# Patient Record
Sex: Female | Born: 1970 | Race: Black or African American | Hispanic: No | Smoking: Never smoker
Health system: Southern US, Community
[De-identification: ages and names within clinical notes are randomized; demographics above are authoritative.]

## PROBLEM LIST (undated history)

## (undated) DIAGNOSIS — M545 Low back pain, unspecified: Secondary | ICD-10-CM

## (undated) DIAGNOSIS — I10 Essential (primary) hypertension: Secondary | ICD-10-CM

## (undated) DIAGNOSIS — M1990 Unspecified osteoarthritis, unspecified site: Secondary | ICD-10-CM

## (undated) DIAGNOSIS — G4730 Sleep apnea, unspecified: Secondary | ICD-10-CM

## (undated) DIAGNOSIS — D649 Anemia, unspecified: Secondary | ICD-10-CM

## (undated) DIAGNOSIS — M542 Cervicalgia: Secondary | ICD-10-CM

## (undated) DIAGNOSIS — E1165 Type 2 diabetes mellitus with hyperglycemia: Secondary | ICD-10-CM

## (undated) DIAGNOSIS — E119 Type 2 diabetes mellitus without complications: Secondary | ICD-10-CM

## (undated) DIAGNOSIS — G4733 Obstructive sleep apnea (adult) (pediatric): Secondary | ICD-10-CM

## (undated) DIAGNOSIS — T7840XA Allergy, unspecified, initial encounter: Secondary | ICD-10-CM

## (undated) DIAGNOSIS — E1159 Type 2 diabetes mellitus with other circulatory complications: Secondary | ICD-10-CM

## (undated) DIAGNOSIS — E669 Obesity, unspecified: Secondary | ICD-10-CM

## (undated) DIAGNOSIS — I152 Hypertension secondary to endocrine disorders: Secondary | ICD-10-CM

## (undated) DIAGNOSIS — IMO0002 Reserved for concepts with insufficient information to code with codable children: Secondary | ICD-10-CM

## (undated) DIAGNOSIS — G8929 Other chronic pain: Secondary | ICD-10-CM

## (undated) HISTORY — DX: Hypertension secondary to endocrine disorders: I15.2

## (undated) HISTORY — DX: Anemia, unspecified: D64.9

## (undated) HISTORY — DX: Type 2 diabetes mellitus with hyperglycemia: E11.65

## (undated) HISTORY — DX: Low back pain, unspecified: M54.50

## (undated) HISTORY — DX: Other chronic pain: G89.29

## (undated) HISTORY — DX: Reserved for concepts with insufficient information to code with codable children: IMO0002

## (undated) HISTORY — DX: Obstructive sleep apnea (adult) (pediatric): G47.33

## (undated) HISTORY — PX: COLONOSCOPY: SHX174

## (undated) HISTORY — DX: Obesity, unspecified: E66.9

## (undated) HISTORY — DX: Sleep apnea, unspecified: G47.30

## (undated) HISTORY — DX: Type 2 diabetes mellitus with other circulatory complications: E11.59

## (undated) HISTORY — DX: Unspecified osteoarthritis, unspecified site: M19.90

## (undated) HISTORY — DX: Essential (primary) hypertension: I10

## (undated) HISTORY — DX: Type 2 diabetes mellitus without complications: E11.9

## (undated) HISTORY — DX: Cervicalgia: M54.2

## (undated) HISTORY — DX: Low back pain: M54.5

## (undated) HISTORY — DX: Allergy, unspecified, initial encounter: T78.40XA

## (undated) HISTORY — PX: TUBAL LIGATION: SHX77

---

## 2018-12-09 ENCOUNTER — Encounter: Payer: Self-pay | Admitting: Family Medicine

## 2018-12-09 ENCOUNTER — Other Ambulatory Visit: Payer: Self-pay

## 2018-12-09 ENCOUNTER — Ambulatory Visit (INDEPENDENT_AMBULATORY_CARE_PROVIDER_SITE_OTHER): Payer: PRIVATE HEALTH INSURANCE | Admitting: Family Medicine

## 2018-12-09 VITALS — BP 160/100 | HR 76 | Ht 63.75 in | Wt 201.4 lb

## 2018-12-09 DIAGNOSIS — E1159 Type 2 diabetes mellitus with other circulatory complications: Secondary | ICD-10-CM

## 2018-12-09 DIAGNOSIS — Z7689 Persons encountering health services in other specified circumstances: Secondary | ICD-10-CM

## 2018-12-09 DIAGNOSIS — E1165 Type 2 diabetes mellitus with hyperglycemia: Secondary | ICD-10-CM | POA: Diagnosis not present

## 2018-12-09 DIAGNOSIS — M545 Low back pain, unspecified: Secondary | ICD-10-CM

## 2018-12-09 DIAGNOSIS — G8929 Other chronic pain: Secondary | ICD-10-CM

## 2018-12-09 DIAGNOSIS — D649 Anemia, unspecified: Secondary | ICD-10-CM

## 2018-12-09 DIAGNOSIS — Z8619 Personal history of other infectious and parasitic diseases: Secondary | ICD-10-CM | POA: Insufficient documentation

## 2018-12-09 DIAGNOSIS — E119 Type 2 diabetes mellitus without complications: Secondary | ICD-10-CM

## 2018-12-09 DIAGNOSIS — G4733 Obstructive sleep apnea (adult) (pediatric): Secondary | ICD-10-CM

## 2018-12-09 DIAGNOSIS — E669 Obesity, unspecified: Secondary | ICD-10-CM | POA: Insufficient documentation

## 2018-12-09 DIAGNOSIS — I152 Hypertension secondary to endocrine disorders: Secondary | ICD-10-CM | POA: Insufficient documentation

## 2018-12-09 DIAGNOSIS — M7070 Other bursitis of hip, unspecified hip: Secondary | ICD-10-CM | POA: Insufficient documentation

## 2018-12-09 DIAGNOSIS — Z6834 Body mass index (BMI) 34.0-34.9, adult: Secondary | ICD-10-CM

## 2018-12-09 DIAGNOSIS — M542 Cervicalgia: Secondary | ICD-10-CM

## 2018-12-09 DIAGNOSIS — I10 Essential (primary) hypertension: Secondary | ICD-10-CM

## 2018-12-09 DIAGNOSIS — IMO0002 Reserved for concepts with insufficient information to code with codable children: Secondary | ICD-10-CM | POA: Insufficient documentation

## 2018-12-09 HISTORY — DX: Anemia, unspecified: D64.9

## 2018-12-09 HISTORY — DX: Obesity, unspecified: E66.9

## 2018-12-09 LAB — POCT GLYCOSYLATED HEMOGLOBIN (HGB A1C): Hemoglobin A1C: 8.9 % — AB (ref 4.0–5.6)

## 2018-12-09 MED ORDER — DAPAGLIFLOZIN PROPANEDIOL 5 MG PO TABS: 5.0000 mg | ORAL_TABLET | Freq: Every day | ORAL | 0 refills | Status: DC

## 2018-12-09 MED ORDER — METFORMIN HCL 1000 MG PO TABS: 1000.0000 mg | ORAL_TABLET | Freq: Two times a day (BID) | ORAL | 1 refills | Status: DC

## 2018-12-09 MED ORDER — LOSARTAN POTASSIUM-HCTZ 50-12.5 MG PO TABS: 1.0000 | ORAL_TABLET | Freq: Every day | ORAL | 1 refills | Status: DC

## 2018-12-09 NOTE — Progress Notes (Signed)
Subjective:    Patient ID: Cindy Carson, female    DOB: 1971-05-21, 48 y.o.   MRN: [id number]  HPI Chief Complaint  Patient presents with  . new pt    new pt, get est. needs refill on meds. right fingers feeling numb for the last month or 2. DM check sugars 2-3 times a week, ranging 200   She is new to the practice and here to establish care and for concerns related to chronic health conditions.  Previous medical care: moved here 5 months ago from [facility], [address] was her Primary Care. Has been working as Lawyer in [employer].    [facility].    Last CPE: 6 month ago with labs   Other providers: [place] doctor in [place]-  [place]- in [place]   OSA- states this was diagnosed in 2019 in [place]. States she does not use the CPAP, does like the mask. She could tell a difference in energy and wakefulness when she did use it.   Diabetes- diagnosed last year. She is non compliant with medications.  Has been taking Metformin 500 mg twice daily and misses doses a couple of times per week.  Invokana - not taking this. States she ran out of this 2 months ago.  Does not check her BS at home. Has meter and supplies at home and states she knows how to check her BS.   Diet is poor. States she is not motivated to eat healthy.  Does not exercise.    HTN- BP has been high at home for the past several months. Taking metoprolol but ran out of it months ago. Taking Clonidine twice daily for the past year.   States she has never been on a statin.   Anemia- iron deficiency and taking iron   Does not have her periods very often now.  Tubal ligation.  Colonoscopy 2-3 years ago for GI bleed. Hemorrhoids per patient.   Chronic pain- low back and neck. States gabapentin did not help.  States she has been taking Cymbalta for the past year or more. States she really cannot tell any difference in pain.   Social history: Lives with her son and his wife, works as Nurse, adult. Former Lawyer in [employer] Married. Husband lives in [place] Denies smoking, drinking alcohol, drug use   LMP: 11/11/2018 Contraception: tubal ligation    Reviewed allergies, medications, past medical, surgical, family, and social history.     Review of Systems Pertinent positives and negatives in the history of present illness.     Objective:   Physical Exam BP (!) 160/100   Pulse 76   Ht 5' 3.75" (1.619 m)   Wt 201 lb 6.4 oz (91.4 kg)   LMP 11/11/2018   BMI 34.84 kg/m   Alert and oriented and in no acute distress. Pharyngeal area is normal. Neck is supple without adenopathy or thyromegaly. Cardiac exam shows a regular sinus rhythm without murmurs or gallops. Lungs are clear to auscultation. Extremities without edema.  Skin is warm and dry.       Assessment & Plan:  Uncontrolled type 2 diabetes mellitus with hyperglycemia (HCC) - Plan: HgB A1c, CBC with Differential/Platelet, Comprehensive metabolic panel, TSH, T4, free, Lipid panel, dapagliflozin propanediol (FARXIGA) 5 MG TABS tablet, metFORMIN (GLUCOPHAGE) 1000 MG tablet  OSA (obstructive sleep apnea)  Hypertension complicating diabetes (HCC) - Plan: CBC with Differential/Platelet, Comprehensive metabolic panel, losartan-hydrochlorothiazide (HYZAAR) 50-12.5 MG tablet  Chronic low back  pain, unspecified back pain laterality, unspecified whether sciatica present  Chronic neck pain  Encounter to establish care  Anemia, unspecified type - Plan: CBC with Differential/Platelet, Vitamin B12, Folate, Iron, TIBC and Ferritin Panel  Class 1 obesity with serious comorbidity and body mass index (BMI) of 34.0 to 34.9 in adult, unspecified obesity type  She is a pleasant 48 year old female who is new to the practice and here to establish care.  She has complex medical history and has been noncompliant with medications. Recently moved here from [place].  I have no medical records currently but I will request these. Hemoglobin  A1c is 8.9% and diabetes is uncontrolled.  Increase metformin to 1000 mg twice daily and start [place] once daily.  Encouraged her to check her blood sugars twice daily for the next couple of weeks and keep a log of her readings to report back to me. In-depth counseling on the diabetes spectrum and standard of care for patients with diabetes.  She is not currently on a statin but we will need to get her started on one at her follow-up visit. She reports being up-to-date on diabetic eye exam.  I will attempt to request this. Hypertension uncontrolled.  She has not been taking metoprolol for several months.  I will stop the metoprolol.  Plan to wean her off clonidine over the next couple of weeks.  She was started on losartan/HCTZ for now.  Encouraged her to keep a close eye on her blood pressure over the next couple of weeks and report back readings. Encourage low-sodium diet. OSA-she is not currently using her CPAP.  Discussed potential long-term health consequences related to uncontrolled sleep apnea.  Encouraged her to start using her CPAP again.  I will attempt to get her sleep study from [place]. Chronic back and neck pain.  She was seen a pain specialist in [place].  Currently taking Cymbalta.  She will continue on this.  Discussed once I have her records that I can refer her to pain management here if she would like.Marland Kitchen History of anemia and she is currently taking iron.  States she does not know why she has anemia.  Reports being up-to-date on colonoscopy.  Check CBC and iron studies as well as vitamin B12 and folate. Follow-up pending labs or in 2 to 4 weeks. At least 45 minutes face-to-face with patient and more than 50% was in counseling and coronation of care.

## 2018-12-09 NOTE — Patient Instructions (Signed)
Your hemoglobin A1c is 8.9% today and your diabetes is uncontrolled.  Increase your metformin to 1000 mg twice daily with meals. Start for [facility] once daily.  Check your blood sugars twice daily for the next 2 weeks.  Keep a record of these readings.  Please call or return in 2 weeks with your readings.  Goal fasting blood sugar is 80-120.  Goal reading for 2 hours after meal is 130-160 If you are seeing readings greater than 200 then this is too high.  I recommend that you cut back on sweets, sugary drinks, carbohydrates such as potatoes, rice, pasta, bread. Increase your physical activity.  Your blood pressure today is elevated as well. Goal blood pressure readings are less than 130/80.  Wean off the clonidine by taking 1/2 tablet twice daily for the next 3 to 5 days.  Then take 1/2 tablet in the evenings only for 3 to 5 days, then stop the medication.  Stop the metoprolol since you have not been on it for several months.  I am sending in a new medication for your blood pressure called losartan/HCTZ.  This will be once daily.  Check your blood pressures twice daily for the next 2 weeks and keep a record. Call or return with these readings as well.  Avoid foods high in sodium   DASH Eating Plan DASH stands for "Dietary Approaches to Stop Hypertension." The DASH eating plan is a healthy eating plan that has been shown to reduce high blood pressure (hypertension). It may also reduce your risk for type 2 diabetes, heart disease, and stroke. The DASH eating plan may also help with weight loss. What are tips for following this plan?  General guidelines  Avoid eating more than 2,300 mg (milligrams) of salt (sodium) a day. If you have hypertension, you may need to reduce your sodium intake to 1,500 mg a day.  Limit alcohol intake to no more than 1 drink a day for nonpregnant women and 2 drinks a day for men. One drink equals 12 oz of beer, 5 oz of wine, or 1 oz of hard liquor.  Work  with your health care provider to maintain a healthy body weight or to lose weight. Ask what an ideal weight is for you.  Get at least 30 minutes of exercise that causes your heart to beat faster (aerobic exercise) most days of the week. Activities may include walking, swimming, or biking.  Work with your health care provider or diet and nutrition specialist (dietitian) to adjust your eating plan to your individual calorie needs. Reading food labels   Check food labels for the amount of sodium per serving. Choose foods with less than 5 percent of the Daily Value of sodium. Generally, foods with less than 300 mg of sodium per serving fit into this eating plan.  To find whole grains, look for the word "whole" as the first word in the ingredient list. Shopping  Buy products labeled as "low-sodium" or "no salt added."  Buy fresh foods. Avoid canned foods and premade or frozen meals. Cooking  Avoid adding salt when cooking. Use salt-free seasonings or herbs instead of table salt or sea salt. Check with your health care provider or pharmacist before using salt substitutes.  Do not fry foods. Cook foods using healthy methods such as baking, boiling, grilling, and broiling instead.  Cook with heart-healthy oils, such as olive, canola, soybean, or sunflower oil. Meal planning  Eat a balanced diet that includes: ? 5 or more servings  of fruits and vegetables each day. At each meal, try to fill half of your plate with fruits and vegetables. ? Up to 6-8 servings of whole grains each day. ? Less than 6 oz of lean meat, poultry, or fish each day. A 3-oz serving of meat is about the same size as a deck of cards. One egg equals 1 oz. ? 2 servings of low-fat dairy each day. ? A serving of nuts, seeds, or beans 5 times each week. ? Heart-healthy fats. Healthy fats called Omega-3 fatty acids are found in foods such as flaxseeds and coldwater fish, like sardines, salmon, and mackerel.  Limit how much you  eat of the following: ? Canned or prepackaged foods. ? Food that is high in trans fat, such as fried foods. ? Food that is high in saturated fat, such as fatty meat. ? Sweets, desserts, sugary drinks, and other foods with added sugar. ? Full-fat dairy products.  Do not salt foods before eating.  Try to eat at least 2 vegetarian meals each week.  Eat more home-cooked food and less restaurant, buffet, and fast food.  When eating at a restaurant, ask that your food be prepared with less salt or no salt, if possible. What foods are recommended? The items listed may not be a complete list. Talk with your dietitian about what dietary choices are best for you. Grains Whole-grain or whole-wheat bread. Whole-grain or whole-wheat pasta. Brown rice. [place]ena Morrow. Bulgur. Whole-grain and low-sodium cereals. Pita bread. Low-fat, low-sodium crackers. Whole-wheat flour tortillas. Vegetables Fresh or frozen vegetables (raw, steamed, roasted, or grilled). Low-sodium or reduced-sodium tomato and vegetable juice. Low-sodium or reduced-sodium tomato sauce and tomato paste. Low-sodium or reduced-sodium canned vegetables. Fruits All fresh, dried, or frozen fruit. Canned fruit in natural juice (without added sugar). Meat and other protein foods Skinless chicken or [place]. Ground chicken or [place]. Pork with fat trimmed off. Fish and seafood. Egg whites. Dried beans, peas, or lentils. Unsalted nuts, nut butters, and seeds. Unsalted canned beans. Lean cuts of beef with fat trimmed off. Low-sodium, lean deli meat. Dairy Low-fat (1%) or fat-free (skim) milk. Fat-free, low-fat, or reduced-fat cheeses. Nonfat, low-sodium ricotta or cottage cheese. Low-fat or nonfat yogurt. Low-fat, low-sodium cheese. Fats and oils Soft margarine without trans fats. Vegetable oil. Low-fat, reduced-fat, or light mayonnaise and salad dressings (reduced-sodium). Canola, safflower, olive, soybean, and sunflower oils. Avocado. Seasoning  and other foods Herbs. Spices. Seasoning mixes without salt. Unsalted popcorn and pretzels. Fat-free sweets. What foods are not recommended? The items listed may not be a complete list. Talk with your dietitian about what dietary choices are best for you. Grains Baked goods made with fat, such as croissants, muffins, or some breads. Dry pasta or rice meal packs. Vegetables Creamed or fried vegetables. Vegetables in a cheese sauce. Regular canned vegetables (not low-sodium or reduced-sodium). Regular canned tomato sauce and paste (not low-sodium or reduced-sodium). Regular tomato and vegetable juice (not low-sodium or reduced-sodium). Angie Fava. Olives. Fruits Canned fruit in a light or heavy syrup. Fried fruit. Fruit in cream or butter sauce. Meat and other protein foods Fatty cuts of meat. Ribs. Fried meat. Berniece Salines. Sausage. Bologna and other processed lunch meats. Salami. Fatback. Hotdogs. Bratwurst. Salted nuts and seeds. Canned beans with added salt. Canned or smoked fish. Whole eggs or egg yolks. Chicken or [place] with skin. Dairy Whole or 2% milk, cream, and half-and-half. Whole or full-fat cream cheese. Whole-fat or sweetened yogurt. Full-fat cheese. Nondairy creamers. Whipped toppings. Processed cheese and cheese spreads.  Fats and oils Butter. Stick margarine. Lard. Shortening. Ghee. Bacon fat. Tropical oils, such as coconut, palm kernel, or palm oil. Seasoning and other foods Salted popcorn and pretzels. Onion salt, garlic salt, seasoned salt, table salt, and sea salt. Worcestershire sauce. Tartar sauce. Barbecue sauce. Teriyaki sauce. Soy sauce, including reduced-sodium. Steak sauce. Canned and packaged gravies. Fish sauce. Oyster sauce. Cocktail sauce. Horseradish that you find on the shelf. Ketchup. Mustard. Meat flavorings and tenderizers. Bouillon cubes. Hot sauce and Tabasco sauce. Premade or packaged marinades. Premade or packaged taco seasonings. Relishes. Regular salad dressings.  Where to find more information:  National Heart, Lung, and Blood Institute: PopSteam.is  American Heart Association: www.heart.org Summary  The DASH eating plan is a healthy eating plan that has been shown to reduce high blood pressure (hypertension). It may also reduce your risk for type 2 diabetes, heart disease, and stroke.  With the DASH eating plan, you should limit salt (sodium) intake to 2,300 mg a day. If you have hypertension, you may need to reduce your sodium intake to 1,500 mg a day.  When on the DASH eating plan, aim to eat more fresh fruits and vegetables, whole grains, lean proteins, low-fat dairy, and heart-healthy fats.  Work with your health care provider or diet and nutrition specialist (dietitian) to adjust your eating plan to your individual calorie needs. This information is not intended to replace advice given to you by your health care provider. Make sure you discuss any questions you have with your health care provider. Document Released: 08/28/2011 Document Revised: 09/01/2016 Document Reviewed: 09/01/2016 Elsevier Interactive Patient Education  2019 [employer].

## 2018-12-10 LAB — COMPREHENSIVE METABOLIC PANEL
ALT: 24 IU/L (ref 0–32)
AST: 21 IU/L (ref 0–40)
Albumin/Globulin Ratio: 1.5 (ref 1.2–2.2)
Albumin: 4.5 g/dL (ref 3.8–4.8)
Alkaline Phosphatase: 96 IU/L (ref 39–117)
BILIRUBIN TOTAL: 0.2 mg/dL (ref 0.0–1.2)
BUN/Creatinine Ratio: 10 (ref 9–23)
BUN: 8 mg/dL (ref 6–24)
CO2: 23 mmol/L (ref 20–29)
Calcium: 9.8 mg/dL (ref 8.7–10.2)
Chloride: 100 mmol/L (ref 96–106)
Creatinine, Ser: 0.78 mg/dL (ref 0.57–1.00)
GFR calc Af Amer: 104 mL/min/{1.73_m2} (ref >59)
GFR calc non Af Amer: 90 mL/min/{1.73_m2} (ref >59)
Globulin, Total: 3.1 g/dL (ref 1.5–4.5)
Glucose: 207 mg/dL — ABNORMAL HIGH (ref 65–99)
Potassium: 4.7 mmol/L (ref 3.5–5.2)
Sodium: 139 mmol/L (ref 134–144)
TOTAL PROTEIN: 7.6 g/dL (ref 6.0–8.5)

## 2018-12-10 LAB — IRON,TIBC AND FERRITIN PANEL
Ferritin: 72 ng/mL (ref 15–150)
Iron Saturation: 23 % (ref 15–55)
Iron: 79 ug/dL (ref 27–159)
Total Iron Binding Capacity: 346 ug/dL (ref 250–450)
UIBC: 267 ug/dL (ref 131–425)

## 2018-12-10 LAB — LIPID PANEL
Chol/HDL Ratio: 4.6 ratio — ABNORMAL HIGH (ref 0.0–4.4)
Cholesterol, Total: 197 mg/dL (ref 100–199)
HDL: 43 mg/dL (ref >39)
LDL Calculated: 103 mg/dL — ABNORMAL HIGH (ref 0–99)
TRIGLYCERIDES: 254 mg/dL — AB (ref 0–149)
VLDL Cholesterol Cal: 51 mg/dL — ABNORMAL HIGH (ref 5–40)

## 2018-12-10 LAB — CBC WITH DIFFERENTIAL/PLATELET
Basophils Absolute: 0.1 10*3/uL (ref 0.0–0.2)
Basos: 1 %
EOS (ABSOLUTE): 0.1 10*3/uL (ref 0.0–0.4)
Eos: 2 %
Hematocrit: 42.5 % (ref 34.0–46.6)
Hemoglobin: 13.7 g/dL (ref 11.1–15.9)
IMMATURE GRANS (ABS): 0 10*3/uL (ref 0.0–0.1)
Immature Granulocytes: 0 %
Lymphocytes Absolute: 1.4 10*3/uL (ref 0.7–3.1)
Lymphs: 27 %
MCH: 25.8 pg — AB (ref 26.6–33.0)
MCHC: 32.2 g/dL (ref 31.5–35.7)
MCV: 80 fL (ref 79–97)
Monocytes Absolute: 0.4 10*3/uL (ref 0.1–0.9)
Monocytes: 7 %
NEUTROS ABS: 3.3 10*3/uL (ref 1.4–7.0)
Neutrophils: 63 %
Platelets: 409 10*3/uL (ref 150–450)
RBC: 5.3 x10E6/uL — ABNORMAL HIGH (ref 3.77–5.28)
RDW: 14.9 % (ref 11.7–15.4)
WBC: 5.3 10*3/uL (ref 3.4–10.8)

## 2018-12-10 LAB — TSH: TSH: 0.606 u[IU]/mL (ref 0.450–4.500)

## 2018-12-10 LAB — VITAMIN B12: Vitamin B-12: 1230 pg/mL (ref 232–1245)

## 2018-12-10 LAB — FOLATE: Folate: >20 ng/mL (ref >3.0)

## 2018-12-10 LAB — T4, FREE: Free T4: 1.34 ng/dL (ref 0.82–1.77)

## 2018-12-16 ENCOUNTER — Telehealth: Payer: Self-pay | Admitting: Family Medicine

## 2018-12-16 MED ORDER — ONETOUCH VERIO IQ SYSTEM W/DEVICE KIT: PACK | 0 refills | Status: DC

## 2018-12-16 NOTE — Telephone Encounter (Signed)
Pt called and said she is supposed to be checking her blood sugars but she is unable to find her glucometer and wants to know if she can a prescription for one sent to the [place] [address]. She has lancets and strips just need glucometer.

## 2018-12-16 NOTE — Telephone Encounter (Signed)
Can you check on this for her?

## 2018-12-16 NOTE — Telephone Encounter (Signed)
Pt is going to call insurance company to find out what is covered as we do not know.

## 2018-12-16 NOTE — Telephone Encounter (Signed)
Pt called and states any meter would work for her. She has used onetouch meter before and she has test strips and lancets already but just needs meter. I have sent in the meter

## 2018-12-29 ENCOUNTER — Telehealth: Payer: Self-pay | Admitting: Family Medicine

## 2018-12-29 MED ORDER — DULOXETINE HCL 60 MG PO CPEP: 60.0000 mg | ORAL_CAPSULE | Freq: Every day | ORAL | 0 refills | Status: DC

## 2018-12-29 NOTE — Telephone Encounter (Signed)
Pt has appt for next week. Please address refills.

## 2018-12-29 NOTE — Telephone Encounter (Signed)
Please ask her to schedule a virtual visit tomorrow so we can discuss blood sugar and also blood pressure readings and current treatment. Thanks.  I have not received her medical records as far as I can tell. Please check for me.

## 2018-12-29 NOTE — Telephone Encounter (Signed)
Left message for pt to call.

## 2018-12-29 NOTE — Telephone Encounter (Signed)
Ok to refill Cymbalta per request.

## 2018-12-29 NOTE — Telephone Encounter (Signed)
Pt called for refills of Cymbalta. Please send to [facility]. Also pt is emailing me her current blood sugar readings. I will send those back when received. Pt can be reached at [phone].

## 2018-12-29 NOTE — Telephone Encounter (Signed)
Done/ pt was notiifed

## 2019-01-06 ENCOUNTER — Other Ambulatory Visit: Payer: Self-pay

## 2019-01-06 ENCOUNTER — Encounter: Payer: Self-pay | Admitting: Family Medicine

## 2019-01-06 ENCOUNTER — Ambulatory Visit (INDEPENDENT_AMBULATORY_CARE_PROVIDER_SITE_OTHER): Payer: PRIVATE HEALTH INSURANCE | Admitting: Family Medicine

## 2019-01-06 VITALS — BP 175/104 | HR 80 | Wt 200.0 lb

## 2019-01-06 DIAGNOSIS — I10 Essential (primary) hypertension: Secondary | ICD-10-CM

## 2019-01-06 DIAGNOSIS — I152 Hypertension secondary to endocrine disorders: Secondary | ICD-10-CM

## 2019-01-06 DIAGNOSIS — G4733 Obstructive sleep apnea (adult) (pediatric): Secondary | ICD-10-CM

## 2019-01-06 DIAGNOSIS — E1165 Type 2 diabetes mellitus with hyperglycemia: Secondary | ICD-10-CM

## 2019-01-06 DIAGNOSIS — E1159 Type 2 diabetes mellitus with other circulatory complications: Secondary | ICD-10-CM | POA: Diagnosis not present

## 2019-01-06 DIAGNOSIS — Z6834 Body mass index (BMI) 34.0-34.9, adult: Secondary | ICD-10-CM

## 2019-01-06 DIAGNOSIS — E669 Obesity, unspecified: Secondary | ICD-10-CM | POA: Diagnosis not present

## 2019-01-06 MED ORDER — AMLODIPINE BESYLATE 5 MG PO TABS: 5.0000 mg | ORAL_TABLET | Freq: Every day | ORAL | 2 refills | Status: DC

## 2019-01-06 NOTE — Progress Notes (Addendum)
Subjective:    Patient ID: Cindy Carson, female    DOB: 1971-04-25, 48 y.o.   MRN: [id number]  HPI Documentation for virtual telephone encounter.  Interactive audio and video telecommunications were attempted between this provider and patient, however due to patient stating she does not have access to video capability, we continued and completed visit with audio only.   The patient was located at work.  2 patient identifiers used.  The provider was located in the office. The patient did consent to this visit and is aware of possible charges through their insurance for this visit.  The other persons participating in this telemedicine service were none.  This is a 4 week follow up on uncontrolled diabetes and HTN.  She is fairly new to me and at her initial visit last month she reported not being compliant on medications for diabetes or hypertension and not using CPAP for OSA. Previous medical care in [place] and no medical records received at this point.   Uncontrolled diabetes.  Her hemoglobin A1c was 8.9% 4 weeks ago.  She has been checking her blood sugars at home and her readings have been high.  Fasting blood sugar ranges from 187-300s. We increased her on Metformin to 1,000 mg bid and added Farxiga.  States she has not taken the [place] in a few days. She thinks it is causing her to have nausea.  Denies polyuria, polydipsia.   HTN- uncontrolled. Started her on losartan- HCTZ and she thinks she is taking this but not sure.  Weaned off clonidine and is no longer taking this.   Denies fever, chills, dizziness, chest pain, palpitations, shortness of breath, abdominal pain, V/D, urinary symptoms, LE edema.    States she is still not using CPAP and does not plan on using it. States she is aware that this could help her BP.   Diet- reports making healthy changes.  Exercising- walking more   LMP: 01/06/19  Reviewed allergies, medications, past medical, surgical, family, and social  history.   Review of Systems Pertinent positives and negatives in the history of present illness.     Objective:   Physical Exam BP (!) 175/104   Pulse 80   Wt 200 lb (90.7 kg)   LMP 01/06/2019   BMI 34.60 kg/m   Alert and oriented and in no acute distress.  Speaking in complete sentences without difficulty.  Denies chest pain, shortness of breath.       Assessment & Plan:  Uncontrolled type 2 diabetes mellitus with hyperglycemia (HCC)  Hypertension complicating diabetes (HCC) - Plan: amLODipine (NORVASC) 5 MG tablet  OSA (obstructive sleep apnea)  Class 1 obesity with serious comorbidity and body mass index (BMI) of 34.0 to 34.9 in adult, unspecified obesity type  Discussed limitations of telephone visit for follow-up on diabetes and hypertension. She does appear to be checking her blood sugars and blood pressures but they are still significantly elevated.  She does not appear to be taking her medications on a regular basis. She has weaned off the clonidine as requested.  Continue on Hyzaar and I will send amlodipine to her pharmacy.  She is aware that she should be on all 3 medications to attempt to get her blood pressure under better control.  She appears asymptomatic. Advised her to eat a low-sodium diet and continue checking her blood pressure at home. Discussed how untreated sleep apnea affects the body and can cause heart failure and may also be affecting her blood pressure.  She  continues to decline using the CPAP. Encouraged her to take the metformin 1000 mg twice daily and start back on for [employer].  Discussed that if nausea persist to let me know and to not stop the medication abruptly without telling me.  Discussed the possibility of adding a long-acting insulin or potentially a once weekly GLP-1 and she declines injections. Discussed the need to be on a statin due to her diabetes.  She declines this.  We will need to address at future appointment. Counseling on healthy  diet and exercise to help control diabetes and hypertension. She did not fill out a medical records request at her previous visit.  We will email this to her so that I can attempt to get medical records from [place]. Request an office follow-up in 6 to 8 weeks as long as the current pandemic is under better control and guidelines allow and office visits at that time.  Spent at least 25 minutes with patient via telephone encounter and 3 minutes reviewing her chart.  This virtual service is not related to other E/M service within previous 7 days.

## 2019-01-26 ENCOUNTER — Telehealth: Payer: Self-pay | Admitting: Family Medicine

## 2019-01-26 MED ORDER — DULOXETINE HCL 60 MG PO CPEP: 60.0000 mg | ORAL_CAPSULE | Freq: Every day | ORAL | 5 refills | Status: DC

## 2019-01-26 NOTE — Telephone Encounter (Signed)
Ok to refill for 6 months 

## 2019-01-26 NOTE — Telephone Encounter (Signed)
Pt called and is requesting a refill on her cymbalta pt needs is to go to [id number] - [place], [place] - [address] pt has a diabetes appt on 05-26

## 2019-01-26 NOTE — Telephone Encounter (Signed)
Sent med to pharmacy  

## 2019-02-15 ENCOUNTER — Ambulatory Visit (INDEPENDENT_AMBULATORY_CARE_PROVIDER_SITE_OTHER): Payer: PRIVATE HEALTH INSURANCE | Admitting: Family Medicine

## 2019-02-15 ENCOUNTER — Other Ambulatory Visit: Payer: Self-pay

## 2019-02-15 ENCOUNTER — Encounter: Payer: Self-pay | Admitting: Family Medicine

## 2019-02-15 VITALS — BP 174/97 | HR 105 | Wt 199.0 lb

## 2019-02-15 DIAGNOSIS — E1165 Type 2 diabetes mellitus with hyperglycemia: Secondary | ICD-10-CM

## 2019-02-15 DIAGNOSIS — G4733 Obstructive sleep apnea (adult) (pediatric): Secondary | ICD-10-CM

## 2019-02-15 DIAGNOSIS — Z91199 Patient's noncompliance with other medical treatment and regimen due to unspecified reason: Secondary | ICD-10-CM | POA: Insufficient documentation

## 2019-02-15 DIAGNOSIS — Z9119 Patient's noncompliance with other medical treatment and regimen: Secondary | ICD-10-CM

## 2019-02-15 DIAGNOSIS — E1159 Type 2 diabetes mellitus with other circulatory complications: Secondary | ICD-10-CM | POA: Diagnosis not present

## 2019-02-15 DIAGNOSIS — I152 Hypertension secondary to endocrine disorders: Secondary | ICD-10-CM

## 2019-02-15 DIAGNOSIS — E785 Hyperlipidemia, unspecified: Secondary | ICD-10-CM

## 2019-02-15 DIAGNOSIS — E669 Obesity, unspecified: Secondary | ICD-10-CM

## 2019-02-15 DIAGNOSIS — I10 Essential (primary) hypertension: Secondary | ICD-10-CM

## 2019-02-15 MED ORDER — EXENATIDE ER 2 MG/0.85ML ~~LOC~~ AUIJ: 1.0000 "application " | AUTO-INJECTOR | SUBCUTANEOUS | 1 refills | Status: DC

## 2019-02-15 MED ORDER — ATORVASTATIN CALCIUM 10 MG PO TABS: 10.0000 mg | ORAL_TABLET | Freq: Every day | ORAL | 1 refills | Status: DC

## 2019-02-15 MED ORDER — LOSARTAN POTASSIUM-HCTZ 100-12.5 MG PO TABS: 1.0000 | ORAL_TABLET | Freq: Every day | ORAL | 3 refills | Status: DC

## 2019-02-15 NOTE — Progress Notes (Signed)
Subjective:   Documentation for virtual audio and video telecommunications through [employer] encounter:  The patient was located at home. 2 patient identifiers used.  The provider was located in the office. The patient did consent to this visit and is aware of possible charges through their insurance for this visit.  The other persons participating in this telemedicine service were none.    Patient ID: Cindy Carson, female    DOB: July 15, 1971, 48 y.o.   MRN: [id number]  Cindy Carson is a 48 y.o. female who presents for follow-up of uncontrolled Type 2 diabetes mellitus and uncontrolled HTN. She has a history of noncompliance with treatment.   Last Hgb A1c 8.9% on 12/09/2018 Reports taking Metformin 1,000 bid and Farxiga 5 mg  States she occasionally forgets to take her evening dose of metformin.   Is not currently on a statin.   HTN- taking losartan -HCTZ 50-12.5 mg Started on amlodipine last month. Reports good daily compliance.   OSA- has been using CPAP some nights. States she can tell this helps her BP and energy level.    Patient is checking home blood sugars.   Home blood sugar records: BGs range between 150 and 242 How often is blood sugars being checked: 3 days per week Current symptoms include: none. Patient denies increased appetite, nausea, visual disturbances, vomiting and weight loss.  Patient is checking their feet daily. Any Foot concerns (callous, ulcer, wound, thickened nails, toenail fungus, skin fungus, hammer toe): none Last dilated eye exam: had one within the last year  Current treatments: doing well on DM meds. Medication compliance: good  Current diet: in general, a "healthy" diet   Current exercise: walking Known diabetic complications: none  The following portions of the patient's history were reviewed and updated as appropriate: allergies, current medications, past medical history, past social history and problem list.  ROS as in subjective above.      Objective:    Physical Exam Alert and in no distress otherwise not examined. This was a virtual visit.   Blood pressure (!) 174/97, pulse (!) 105, weight 199 lb (90.3 kg).  Lab Review Diabetic Labs Latest Ref Rng & Units 12/09/2018  HbA1c 4.0 - 5.6 % 8.9(A)  Chol 100 - 199 mg/dL 829197  HDL >56>39 mg/dL 43  Calc LDL 0 - 99 mg/dL 213(Y103(H)  Triglycerides 0 - 149 mg/dL 865(H254(H)  Creatinine 8.460.57 - 1.00 mg/dL 9.620.78   BP/Weight 9/52/84135/26/2020 01/06/2019 12/09/2018  Systolic BP 174 175 160  Diastolic BP 97 104 100  Wt. (Lbs) 199 200 201.4  BMI 34.43 34.6 34.84   No flowsheet data found.  Cindy Carson  reports that she has never smoked. She has never used smokeless tobacco. She reports that she does not drink alcohol or use drugs.     Assessment & Plan:    Uncontrolled type 2 diabetes mellitus with hyperglycemia (HCC) - Plan: Amb ref to Medical Nutrition Therapy-MNT  Hypertension complicating diabetes (HCC) - Plan: losartan-hydrochlorothiazide (HYZAAR) 100-12.5 MG tablet, Amb ref to Medical Nutrition Therapy-MNT  OSA (obstructive sleep apnea)  Personal history of noncompliance with medical treatment, presenting hazards to health  Hyperlipidemia, unspecified hyperlipidemia type - Plan: atorvastatin (LIPITOR) 10 MG tablet  Obesity (BMI 30-39.9) - Plan: Amb ref to Medical Nutrition Therapy-MNT  1. Rx changes: increase losartan to 100 mg and continue on HCTZ 12.5 mg and amlodipine. add GLP-1 to regimen for diabetes 2. Education: Reviewed 'ABCs' of diabetes management (respective goals in parentheses):  A1C (<7), blood pressure (<130/80), and  cholesterol (LDL <100). 3. Compliance at present is estimated to be fair. Efforts to improve compliance (if necessary) will be directed at dietary modifications: cut back on carbohydrates and sodium, increased exercise and regular blood sugar monitoring: daily. 4. Encouraged her to take her medications as prescribed daily and avoid skipping doses. 5. Congratulated her on  starting back using CPAP machine.  Encouraged her to continue it.  This should help with blood pressure and overall health. 6. Started her on a statin for diabetes standard of care. 7. Follow up: 1 month in office if possible.  She will be due for a repeat hemoglobin A1c 8. On still attempting to get records from [place] including diabetic eye exam.  Time spent on call was 25 minutes and in review of previous records 5 minutes total.  This virtual service is not related to other E/M service within previous 7 days.

## 2019-02-15 NOTE — Patient Instructions (Addendum)
Your blood pressure is still too high.  I am increasing your losartan- HCTZ dose to 100 - 12.5 mg daily.  Continue on amlodipine 5 mg daily.   Using your CPAP machine will help as you can tell. Continue using this nightly.   Your blood sugars are still to high as well.  Continue taking Metformin 1,000 mg twice daily.  Continue on Farxiga 5 mg once daily   Add once weekly injection of Bcise or Trulicity (we need to see which one is better covered by your insurance). This is not insulin. It is a GLP-1 agonist for diabetes.   I am sending in a medication called atorvastatin for your cholesterol. This is a once daily tablet.   Follow up with me in 4 weeks and bring in your blood sugar and blood pressure readings.

## 2019-03-08 ENCOUNTER — Ambulatory Visit: Payer: PRIVATE HEALTH INSURANCE | Admitting: Medical

## 2019-03-08 ENCOUNTER — Other Ambulatory Visit: Payer: Self-pay

## 2019-03-09 ENCOUNTER — Encounter: Payer: Self-pay | Admitting: Family Medicine

## 2019-03-09 ENCOUNTER — Ambulatory Visit (INDEPENDENT_AMBULATORY_CARE_PROVIDER_SITE_OTHER): Payer: PRIVATE HEALTH INSURANCE | Admitting: Family Medicine

## 2019-03-09 VITALS — BP 120/80 | HR 89 | Temp 97.7°F | Wt 197.6 lb

## 2019-03-09 DIAGNOSIS — I10 Essential (primary) hypertension: Secondary | ICD-10-CM

## 2019-03-09 DIAGNOSIS — I152 Hypertension secondary to endocrine disorders: Secondary | ICD-10-CM

## 2019-03-09 DIAGNOSIS — Z113 Encounter for screening for infections with a predominantly sexual mode of transmission: Secondary | ICD-10-CM | POA: Diagnosis not present

## 2019-03-09 DIAGNOSIS — B3731 Acute candidiasis of vulva and vagina: Secondary | ICD-10-CM

## 2019-03-09 DIAGNOSIS — N898 Other specified noninflammatory disorders of vagina: Secondary | ICD-10-CM

## 2019-03-09 DIAGNOSIS — B373 Candidiasis of vulva and vagina: Secondary | ICD-10-CM

## 2019-03-09 DIAGNOSIS — E1159 Type 2 diabetes mellitus with other circulatory complications: Secondary | ICD-10-CM

## 2019-03-09 DIAGNOSIS — E1165 Type 2 diabetes mellitus with hyperglycemia: Secondary | ICD-10-CM

## 2019-03-09 LAB — POCT WET PREP (WET MOUNT)
Clue Cells Wet Prep Whiff POC: NEGATIVE
Trichomonas Wet Prep HPF POC: ABSENT

## 2019-03-09 MED ORDER — FARXIGA 5 MG PO TABS: 5.0000 mg | ORAL_TABLET | Freq: Every day | ORAL | 0 refills | Status: DC

## 2019-03-09 MED ORDER — FLUCONAZOLE 150 MG PO TABS: 150.0000 mg | ORAL_TABLET | Freq: Once | ORAL | 0 refills | Status: AC

## 2019-03-09 NOTE — Progress Notes (Signed)
   Subjective:    Patient ID: Cindy Carson, female    DOB: [birth year], 48 y.o.   MRN: [id number]  HPI Chief Complaint  Patient presents with  . yeast infection    yeast infection- couple days, itchy, white discharge, checking sugars every once in a while   She is here with complaints of a 2-day history of vaginal itching and a thick white discharge that has a bad odor.  States she started her cycle today and had sexual intercourse with her husband prior to onset of symptoms.  Would like to be tested for STDs. No dyspareunia.   Denies fever, chills, chest pain, palpitations, shortness of breath, abdominal pain, back pain,  N/V/D, urinary symptoms.   HTN- states she is taking her medications daily without any side effects.   Diabetes- fasting BS ranging between 150-200.  Taking Metformin twice daily and [facility] was not affordable.   States she is taking atorvastatin daily. No concerns.   Reviewed allergies, medications, past medical, surgical, family, and social history.  Review of Systems Pertinent positives and negatives in the history of present illness.     Objective:   Physical Exam Exam conducted with a chaperone present.  Constitutional:      Appearance: Normal appearance.  Abdominal:     General: Abdomen is flat. There is no distension.     Palpations: Abdomen is soft.     Tenderness: There is no abdominal tenderness.  Genitourinary:    Labia:        Right: No rash, tenderness or lesion.        Left: No rash, tenderness or lesion.      Vagina: Vaginal discharge present.     Comments: White discharge  Neurological:     Mental Status: She is alert.    BP 120/80   Pulse 89   Temp 97.7 F (36.5 C) (Oral)   Wt 197 lb 9.6 oz (89.6 kg)   LMP 03/09/2019   SpO2 97%   BMI 34.18 kg/m         Assessment & Plan:  Vaginal discharge - Plan: POCT Wet Prep [facility]), HIV Antibody (routine testing w rflx), RPR, GC/Chlamydia Probe Amp, Trichomonas vaginalis,  RNA. Wet prep shows yeast infection. Treat with diflucan.   Hypertension complicating diabetes ([place]) - Plan: continue medications. BP under control now.   Vaginal odor - Plan: treat yeast infection   Screen for STD (sexually transmitted disease) - Plan: HIV Antibody (routine testing w rflx), RPR, GC/Chlamydia Probe Amp, Trichomonas vaginalis, RNA. Treat as needed.

## 2019-03-09 NOTE — Patient Instructions (Addendum)
Take the diflucan for yeast infection.   We will wait on your other results and let you know when I have them.   You can use over the counter Replens if needed for dryness.   Follow up with me for your diabetes check and cholesterol test in July as scheduled. Bring in your blood pressure machine and your blood sugar readings please. Come in fasting (nothing to eat or drink except water for at least 6 hours).

## 2019-03-10 LAB — RPR: RPR Ser Ql: NONREACTIVE

## 2019-03-10 LAB — HIV ANTIBODY (ROUTINE TESTING W REFLEX): HIV Screen 4th Generation wRfx: NONREACTIVE

## 2019-03-13 LAB — GC/CHLAMYDIA PROBE AMP
Chlamydia trachomatis, NAA: NEGATIVE
Neisseria Gonorrhoeae by PCR: NEGATIVE

## 2019-03-13 LAB — TRICHOMONAS VAGINALIS, PROBE AMP: Trich vag by NAA: NEGATIVE

## 2019-03-16 ENCOUNTER — Ambulatory Visit: Payer: PRIVATE HEALTH INSURANCE | Admitting: Family Medicine

## 2019-03-30 ENCOUNTER — Other Ambulatory Visit: Payer: Self-pay

## 2019-03-30 ENCOUNTER — Encounter: Payer: Self-pay | Admitting: Family Medicine

## 2019-03-30 ENCOUNTER — Ambulatory Visit (INDEPENDENT_AMBULATORY_CARE_PROVIDER_SITE_OTHER): Payer: PRIVATE HEALTH INSURANCE | Admitting: Family Medicine

## 2019-03-30 VITALS — BP 128/80 | HR 84 | Temp 98.6°F | Wt 197.4 lb

## 2019-03-30 DIAGNOSIS — Z6834 Body mass index (BMI) 34.0-34.9, adult: Secondary | ICD-10-CM

## 2019-03-30 DIAGNOSIS — E1165 Type 2 diabetes mellitus with hyperglycemia: Secondary | ICD-10-CM | POA: Diagnosis not present

## 2019-03-30 DIAGNOSIS — G4733 Obstructive sleep apnea (adult) (pediatric): Secondary | ICD-10-CM | POA: Diagnosis not present

## 2019-03-30 DIAGNOSIS — E1159 Type 2 diabetes mellitus with other circulatory complications: Secondary | ICD-10-CM | POA: Diagnosis not present

## 2019-03-30 DIAGNOSIS — Z91199 Patient's noncompliance with other medical treatment and regimen due to unspecified reason: Secondary | ICD-10-CM

## 2019-03-30 DIAGNOSIS — E669 Obesity, unspecified: Secondary | ICD-10-CM | POA: Diagnosis not present

## 2019-03-30 DIAGNOSIS — I10 Essential (primary) hypertension: Secondary | ICD-10-CM

## 2019-03-30 DIAGNOSIS — I152 Hypertension secondary to endocrine disorders: Secondary | ICD-10-CM

## 2019-03-30 DIAGNOSIS — E785 Hyperlipidemia, unspecified: Secondary | ICD-10-CM

## 2019-03-30 DIAGNOSIS — Z9119 Patient's noncompliance with other medical treatment and regimen: Secondary | ICD-10-CM

## 2019-03-30 MED ORDER — FARXIGA 5 MG PO TABS: 5.0000 mg | ORAL_TABLET | Freq: Every day | ORAL | 0 refills | Status: DC

## 2019-03-30 NOTE — Progress Notes (Signed)
Subjective:   Documentation for virtual audio and video telecommunications through [employer] encounter: She came in to the office late for her appt so we opted to get vitals and labs and then do a virtual visit in the afternoon.   The patient was located at work. 2 patient identifiers used.  The provider was located in the office. The patient did consent to this visit and is aware of possible charges through their insurance for this visit.  The other persons participating in this telemedicine service were none.    Patient ID: Cindy Carson, female    DOB: 1971-01-09, 48 y.o.   MRN: [id number]  Cindy Carson is a 48 y.o. female who presents for follow-up of Type 2 diabetes mellitus and other chronic health conditions.   HTN-reports taking amlodipine and Hyzaar daily without any issues.   DM-Hgb A1c 8.9 previously  Has appt with nutritionist later this month.   Declines insulin. States she will not take this. Knows her diet is the reason for hyperglycemia. States she is asymptomatic.   Misses doses of metformin in the evening.   Taking statin daily.   States she is not using her CPAP. States she does not like it.    Patient is checking home blood sugars.   Home blood sugar records: BGs range between 180 and 290 How often is blood sugars being checked: 2 times a week Current symptoms include: none. Patient denies increased appetite, nausea, visual disturbances, vomiting and weight loss.  Patient is checking their feet daily. Any Foot concerns (callous, ulcer, wound, thickened nails, toenail fungus, skin fungus, hammer toe): none Last dilated eye exam: due  Current treatments: ran out of farixga, pharmacy said they didn't have a rx for it. been out for 2 weeks gave a month worth of samples today. Taking metformin  Medication compliance: fair  Current diet: in general, a "healthy" diet   Current exercise: yoga- 2-3 times a week Known diabetic complications: none  The following portions  of the patient's history were reviewed and updated as appropriate: allergies, current medications, past medical history, past social history and problem list.  ROS as in subjective above.     Objective:    Physical Exam Alert and in no distress otherwise not examined.  Blood pressure 128/80, pulse 84, temperature 98.6 F (37 C), temperature source Oral, weight 197 lb 6.4 oz (89.5 kg), last menstrual period 03/09/2019, SpO2 97 %.  Lab Review Diabetic Labs Latest Ref Rng & Units 12/09/2018  HbA1c 4.0 - 5.6 % 8.9(A)  Chol 100 - 199 mg/dL 409197  HDL >81>39 mg/dL 43  Calc LDL 0 - 99 mg/dL 191(Y103(H)  Triglycerides 0 - 149 mg/dL 782(N254(H)  Creatinine 5.620.57 - 1.00 mg/dL 1.300.78   BP/Weight 8/6/57847/04/2019 03/09/2019 02/15/2019 01/06/2019 12/09/2018  Systolic BP 128 120 174 175 160  Diastolic BP 80 80 97 104 100  Wt. (Lbs) 197.4 197.6 199 200 201.4  BMI 34.15 34.18 34.43 34.6 34.84   No flowsheet data found.  Cindy Carson  reports that she has never smoked. She has never used smokeless tobacco. She reports that she does not drink alcohol or use drugs.     Assessment & Plan:    Uncontrolled type 2 diabetes mellitus with hyperglycemia (HCC) - Plan: CBC with Differential/Platelet, Comprehensive metabolic panel, TSH, T4, free, Hemoglobin A1c, dapagliflozin propanediol (FARXIGA) 5 MG TABS tablet- see below   Hypertension complicating diabetes (HCC) - Plan: CBC with Differential/Platelet, Comprehensive metabolic panel  Class 1 obesity with serious comorbidity and  body mass index (BMI) of 34.0 to 34.9 in adult, unspecified obesity type - Plan: TSH, T4, free, Lipid panel  OSA (obstructive sleep apnea)   Hyperlipidemia, unspecified hyperlipidemia type - Plan: CBC with Differential/Platelet, Comprehensive metabolic panel, Lipid panel  Personal history of noncompliance with medical treatment, presenting hazards to health   1. Rx changes: will make adjustments once i have her Hgb A1c tomorrow. she came in for labs today.   refuses to be on insulin. Discussed that the only way to get her blood sugars down will be to take better care of herself by eating a healthier diet and increasing exercise and/or medication changes. Sent in a new prescription for Farxiga, she has been out but did not let me know. Continue Metformin and set alarm to take this in the evening since she forgets.  2. Has appt to see the nutritionist.  3. Education: Reviewed 'ABCs' of diabetes management (respective goals in parentheses):  A1C (<7), blood pressure (<130/80), and cholesterol (LDL <100). 4. Compliance at present is estimated to be poor. Efforts to improve compliance (if necessary) will be directed at dietary modifications: cut back on sweets and carbohydrates, she is aware of her poor diet habits , increased exercise, regular blood sugar monitoring: daily and encouraged her to take her medications regularly, set a reminder. 5. Needs to schedule diabetic eye exam  6. HTN- BP in goal range. Continue on medications. Eat a low sodium diet.  7. HL- continue statin.  8. OSA untreated- discussed long term potential health consequences. Encouraged her to use CPAP machine.  9. Follow up: 4 months   She was in the office for vitals and labs this morning. She was late for her appointment and stated she did not have time to wait until my next open appt time so she opted to do a virtual visit this afternoon.   Time spent on call was 22 minutes and in review of previous records 4 minutes total.  This virtual service is not related to other E/M service within previous 7 days.

## 2019-03-31 ENCOUNTER — Other Ambulatory Visit: Payer: Self-pay | Admitting: Internal Medicine

## 2019-03-31 DIAGNOSIS — E875 Hyperkalemia: Secondary | ICD-10-CM

## 2019-03-31 LAB — CBC WITH DIFFERENTIAL/PLATELET
Basophils Absolute: 0.1 10*3/uL (ref 0.0–0.2)
Basos: 1 %
EOS (ABSOLUTE): 0.1 10*3/uL (ref 0.0–0.4)
Eos: 2 %
Hematocrit: 37.6 % (ref 34.0–46.6)
Hemoglobin: 12.7 g/dL (ref 11.1–15.9)
Immature Grans (Abs): 0 10*3/uL (ref 0.0–0.1)
Immature Granulocytes: 0 %
Lymphocytes Absolute: 1.5 10*3/uL (ref 0.7–3.1)
Lymphs: 26 %
MCH: 26.2 pg — ABNORMAL LOW (ref 26.6–33.0)
MCHC: 33.8 g/dL (ref 31.5–35.7)
MCV: 78 fL — ABNORMAL LOW (ref 79–97)
Monocytes Absolute: 0.4 10*3/uL (ref 0.1–0.9)
Monocytes: 8 %
Neutrophils Absolute: 3.6 10*3/uL (ref 1.4–7.0)
Neutrophils: 63 %
Platelets: 362 10*3/uL (ref 150–450)
RBC: 4.85 x10E6/uL (ref 3.77–5.28)
RDW: 14.1 % (ref 11.7–15.4)
WBC: 5.7 10*3/uL (ref 3.4–10.8)

## 2019-03-31 LAB — COMPREHENSIVE METABOLIC PANEL
ALT: 15 IU/L (ref 0–32)
AST: 12 IU/L (ref 0–40)
Albumin/Globulin Ratio: 1.4 (ref 1.2–2.2)
Albumin: 4.2 g/dL (ref 3.8–4.8)
Alkaline Phosphatase: 88 IU/L (ref 39–117)
BUN/Creatinine Ratio: 7 — ABNORMAL LOW (ref 9–23)
BUN: 6 mg/dL (ref 6–24)
Bilirubin Total: 0.2 mg/dL (ref 0.0–1.2)
CO2: 19 mmol/L — ABNORMAL LOW (ref 20–29)
Calcium: 9.1 mg/dL (ref 8.7–10.2)
Chloride: 101 mmol/L (ref 96–106)
Creatinine, Ser: 0.86 mg/dL (ref 0.57–1.00)
GFR calc Af Amer: 92 mL/min/{1.73_m2} (ref >59)
GFR calc non Af Amer: 80 mL/min/{1.73_m2} (ref >59)
Globulin, Total: 3 g/dL (ref 1.5–4.5)
Glucose: 249 mg/dL — ABNORMAL HIGH (ref 65–99)
Potassium: 5.8 mmol/L — ABNORMAL HIGH (ref 3.5–5.2)
Sodium: 138 mmol/L (ref 134–144)
Total Protein: 7.2 g/dL (ref 6.0–8.5)

## 2019-03-31 LAB — LIPID PANEL
Chol/HDL Ratio: 3.4 ratio (ref 0.0–4.4)
Cholesterol, Total: 138 mg/dL (ref 100–199)
HDL: 41 mg/dL (ref >39)
LDL Calculated: 59 mg/dL (ref 0–99)
Triglycerides: 189 mg/dL — ABNORMAL HIGH (ref 0–149)
VLDL Cholesterol Cal: 38 mg/dL (ref 5–40)

## 2019-03-31 LAB — HEMOGLOBIN A1C
Est. average glucose Bld gHb Est-mCnc: 194 mg/dL
Hgb A1c MFr Bld: 8.4 % — ABNORMAL HIGH (ref 4.8–5.6)

## 2019-03-31 LAB — T4, FREE: Free T4: 1.23 ng/dL (ref 0.82–1.77)

## 2019-03-31 LAB — TSH: TSH: 1.05 u[IU]/mL (ref 0.450–4.500)

## 2019-04-04 ENCOUNTER — Other Ambulatory Visit: Payer: PRIVATE HEALTH INSURANCE

## 2019-04-14 ENCOUNTER — Ambulatory Visit: Payer: PRIVATE HEALTH INSURANCE | Admitting: Skilled Nursing Facility1

## 2019-05-18 ENCOUNTER — Encounter: Payer: Self-pay | Admitting: Skilled Nursing Facility1

## 2019-05-18 ENCOUNTER — Other Ambulatory Visit: Payer: Self-pay

## 2019-05-18 ENCOUNTER — Encounter: Payer: PRIVATE HEALTH INSURANCE | Attending: Family Medicine | Admitting: Skilled Nursing Facility1

## 2019-05-18 DIAGNOSIS — E1165 Type 2 diabetes mellitus with hyperglycemia: Secondary | ICD-10-CM | POA: Diagnosis present

## 2019-05-18 NOTE — Progress Notes (Signed)
Pt states she only takes 1 metformin and does not wear her C-PAP: dietitian educated the pt on the need to wear her C-PAP and take her medication properly.  Pts A1C 8.4. Pt states she checks her blood sugar 1 time a day fasting: 190-200 Pt states she checks her blood pressure in the morning as well. Pt states she stays tired in the afternoon admitting it is most likely due to not wearing her C-PAP. Pt states she does not want to eat meat anymore.  Pt states she is not into vegetables.  Pt states she is lactose intolerant.   Dietitian advised drinking a certain amount of water to edge out sugary drinks.   To talk about next time: Trying new vegetables Eating out less often  Diabetes Self-Management Education  Visit Type: First/Initial  05/18/2019  Cindy Carson, identified by name and date of birth, is a 48 y.o. female with a diagnosis of Diabetes: Type 2.   ASSESSMENT  Height _0  (1.575 m), weight 196 lb (88.9 kg). Body mass index is 35.85 kg/m.  Diabetes Self-Management Education - 05/18/19 0843      Visit Information   Visit Type  First/Initial      Initial Visit   Diabetes Type  Type 2    Are you taking your medications as prescribed?  No      Health Coping   How would you rate your overall health?  Poor      Psychosocial Assessment   Patient Belief/Attitude about Diabetes  Afraid    Self-management support  Family      Pre-Education Assessment   Patient understands the diabetes disease and treatment process.  Needs Instruction    Patient understands incorporating nutritional management into lifestyle.  Needs Instruction    Patient undertands incorporating physical activity into lifestyle.  Needs Instruction    Patient understands using medications safely.  Needs Instruction    Patient understands monitoring blood glucose, interpreting and using results  Needs Instruction    Patient understands prevention, detection, and treatment of acute complications.  Needs  Instruction    Patient understands prevention, detection, and treatment of chronic complications.  Needs Instruction    Patient understands how to develop strategies to address psychosocial issues.  Needs Instruction    Patient understands how to develop strategies to promote health/change behavior.  Needs Instruction      Complications   Last HgB A1C per patient/outside source  8.4 %    How often do you check your blood sugar?  1-2 times/day    Fasting Blood glucose range (mg/dL)  180-200    Number of hypoglycemic episodes per month  0    Number of hyperglycemic episodes per week  0    Have you had a dilated eye exam in the past 12 months?  No    Have you had a dental exam in the past 12 months?  No    Are you checking your feet?  Yes    How many days per week are you checking your feet?  7      Dietary Intake   Breakfast  2 oatmeal with suagr and butter or 2 grits with butter and sugar or fast food    Snack (morning)  fruit cup and candy    Lunch  2 ham and cheese sandwich and fruit cup or appelsauce or fast food    Snack (afternoon)  [place] ice and other dessert    Dinner  noodles or hot  dogs or mac n cheese or rice    Snack (evening)  [place] ice and popcicle    Beverage(s)  coffee with cream and sugar, bottled green tea, juice, soda, water      Exercise   Exercise Type  ADL's    How many days per week to you exercise?  0    How many minutes per day do you exercise?  0    Total minutes per week of exercise  0      Patient Education   Previous Diabetes Education  No    Disease state   Factors that contribute to the development of diabetes    Acute complications  Taught treatment of hypoglycemia - the 15 rule.;Discussed and identified patients' treatment of hyperglycemia.      Individualized Goals (developed by patient)   Nutrition  General guidelines for healthy choices and portions discussed;Follow meal plan discussed    Physical Activity  Exercise 1-2 times per week;30  minutes per day    Monitoring   test my blood glucose as discussed;test blood glucose pre and post meals as discussed      Post-Education Assessment   Patient understands the diabetes disease and treatment process.  Demonstrates understanding / competency    Patient understands incorporating nutritional management into lifestyle.  Demonstrates understanding / competency    Patient undertands incorporating physical activity into lifestyle.  Demonstrates understanding / competency    Patient understands using medications safely.  Demonstrates understanding / competency    Patient understands monitoring blood glucose, interpreting and using results  Demonstrates understanding / competency    Patient understands prevention, detection, and treatment of acute complications.  Demonstrates understanding / competency    Patient understands prevention, detection, and treatment of chronic complications.  Demonstrates understanding / competency    Patient understands how to develop strategies to address psychosocial issues.  Demonstrates understanding / competency    Patient understands how to develop strategies to promote health/change behavior.  Demonstrates understanding / competency      Outcomes   Expected Outcomes  Demonstrated interest in learning. Expect positive outcomes    Future DMSE  4-6 wks    Program Status  Completed       Individualized Plan for Diabetes Self-Management Training:   Learning Objective:  Patient will have a greater understanding of diabetes self-management. Patient education plan is to attend individual and/or group sessions per assessed needs and concerns.   Plan:   Patient Instructions  -Put metformin on the microwave    -Wear your C-PAP nightly! You gotta breath Girl!  -Get up at your desk every 30 minutes at work  -1 day a week go for a walk at lunch  -Always bring your meter with you everywhere you go -Always Properly dispose of your needles:  -Discard  in a hard plastic/metal container with a lid (something the needle can't puncture)  -Write Do Not Recycle on the outside of the container  -Example: A laundry detergent bottle -Never use the same needle more than once -Eat 2-3 carbohydrate choices for each meal and 1 for each snack -A meal: carbohydrates, protein, vegetable -A snack: A Fruit OR Vegetable AND Protein  -Try to be more active -Always pay attention to your body keeping watchful of possible low blood sugar (below 70) or high blood sugar (above 200)  -Check your feet every day looking for anything that was not there the day before   -Check your blood sugar 1 time every day (fasting OR  2 hours after a meal)  -Use alternative sugar instead of regular sugar in your oatmeal and grits  -Drink 64 ounces of water a day   Expected Outcomes:  Demonstrated interest in learning. Expect positive outcomes  Education material provided: ADA - How to Thrive: A Guide for Your Journey with Diabetes and Meal plan card  If problems or questions, patient to contact team via:  Phone  Future DSME appointment: 4-6 wks

## 2019-05-18 NOTE — Patient Instructions (Addendum)
-  Put metformin on the microwave    -Wear your C-PAP nightly! You gotta breath Girl!  -Get up at your desk every 30 minutes at work  -1 day a week go for a walk at lunch  -Always bring your meter with you everywhere you go -Always Properly dispose of your needles:  -Discard in a hard plastic/metal container with a lid (something the needle can't puncture)  -Write Do Not Recycle on the outside of the container  -Example: A laundry detergent bottle -Never use the same needle more than once -Eat 2-3 carbohydrate choices for each meal and 1 for each snack -A meal: carbohydrates, protein, vegetable -A snack: A Fruit OR Vegetable AND Protein  -Try to be more active -Always pay attention to your body keeping watchful of possible low blood sugar (below 70) or high blood sugar (above 200)  -Check your feet every day looking for anything that was not there the day before   -Check your blood sugar 1 time every day (fasting OR 2 hours after a meal)  -Use alternative sugar instead of regular sugar in your oatmeal and grits  -Drink 64 ounces of water a day

## 2019-06-02 ENCOUNTER — Other Ambulatory Visit: Payer: Self-pay | Admitting: Family Medicine

## 2019-06-02 DIAGNOSIS — E1159 Type 2 diabetes mellitus with other circulatory complications: Secondary | ICD-10-CM

## 2019-06-02 DIAGNOSIS — I152 Hypertension secondary to endocrine disorders: Secondary | ICD-10-CM

## 2019-06-22 ENCOUNTER — Ambulatory Visit: Payer: PRIVATE HEALTH INSURANCE | Admitting: *Deleted

## 2019-08-23 ENCOUNTER — Telehealth: Payer: Self-pay | Admitting: Family Medicine

## 2019-08-23 DIAGNOSIS — E1165 Type 2 diabetes mellitus with hyperglycemia: Secondary | ICD-10-CM

## 2019-08-23 DIAGNOSIS — E1159 Type 2 diabetes mellitus with other circulatory complications: Secondary | ICD-10-CM

## 2019-08-23 DIAGNOSIS — I152 Hypertension secondary to endocrine disorders: Secondary | ICD-10-CM

## 2019-08-23 DIAGNOSIS — E785 Hyperlipidemia, unspecified: Secondary | ICD-10-CM

## 2019-08-23 MED ORDER — METFORMIN HCL 1000 MG PO TABS: 1000.0000 mg | ORAL_TABLET | Freq: Two times a day (BID) | ORAL | 0 refills | Status: DC

## 2019-08-23 MED ORDER — DULOXETINE HCL 60 MG PO CPEP: 60.0000 mg | ORAL_CAPSULE | Freq: Every day | ORAL | 0 refills | Status: DC

## 2019-08-23 MED ORDER — AMLODIPINE BESYLATE 5 MG PO TABS: 5.0000 mg | ORAL_TABLET | Freq: Every day | ORAL | 0 refills | Status: DC

## 2019-08-23 MED ORDER — ATORVASTATIN CALCIUM 10 MG PO TABS: 10.0000 mg | ORAL_TABLET | Freq: Every day | ORAL | 0 refills | Status: DC

## 2019-08-23 NOTE — Telephone Encounter (Signed)
Pt just recently moved and needs refills on Amlodipine, Atorvastatin, Metformin and Duloxetine sent to the [facility] [address]. [place], [place] [address]

## 2019-08-23 NOTE — Telephone Encounter (Signed)
Sent in 1 and pt was notified

## 2019-08-23 NOTE — Telephone Encounter (Signed)
Ok to give her 30 days and she will need to find a new PCP in her area.

## 2019-10-04 ENCOUNTER — Telehealth: Payer: Self-pay | Admitting: Family Medicine

## 2019-10-04 NOTE — Telephone Encounter (Signed)
Dismissal letter in guarantor snapshot  °

## 2020-08-06 DIAGNOSIS — N951 Menopausal and female climacteric states: Secondary | ICD-10-CM | POA: Diagnosis not present

## 2020-08-06 DIAGNOSIS — E669 Obesity, unspecified: Secondary | ICD-10-CM | POA: Diagnosis not present

## 2020-08-06 DIAGNOSIS — Z01419 Encounter for gynecological examination (general) (routine) without abnormal findings: Secondary | ICD-10-CM | POA: Diagnosis not present

## 2020-08-06 DIAGNOSIS — E119 Type 2 diabetes mellitus without complications: Secondary | ICD-10-CM | POA: Diagnosis not present

## 2020-08-06 DIAGNOSIS — F320 Major depressive disorder, single episode, mild: Secondary | ICD-10-CM | POA: Diagnosis not present

## 2020-08-06 LAB — HM PAP SMEAR

## 2020-12-15 DIAGNOSIS — M5432 Sciatica, left side: Secondary | ICD-10-CM | POA: Diagnosis not present

## 2021-03-01 DIAGNOSIS — Z20822 Contact with and (suspected) exposure to covid-19: Secondary | ICD-10-CM | POA: Diagnosis not present

## 2021-04-03 ENCOUNTER — Ambulatory Visit: Payer: PRIVATE HEALTH INSURANCE | Admitting: Internal Medicine

## 2021-04-10 ENCOUNTER — Encounter: Payer: Self-pay | Admitting: Internal Medicine

## 2021-04-10 ENCOUNTER — Other Ambulatory Visit: Payer: Self-pay

## 2021-04-10 ENCOUNTER — Ambulatory Visit (INDEPENDENT_AMBULATORY_CARE_PROVIDER_SITE_OTHER): Payer: BC Managed Care – PPO | Admitting: Internal Medicine

## 2021-04-10 VITALS — BP 142/92 | HR 87 | Ht 62.0 in | Wt 193.4 lb

## 2021-04-10 DIAGNOSIS — I10 Essential (primary) hypertension: Secondary | ICD-10-CM | POA: Diagnosis not present

## 2021-04-10 DIAGNOSIS — E669 Obesity, unspecified: Secondary | ICD-10-CM

## 2021-04-10 DIAGNOSIS — G4733 Obstructive sleep apnea (adult) (pediatric): Secondary | ICD-10-CM | POA: Diagnosis not present

## 2021-04-10 DIAGNOSIS — E1165 Type 2 diabetes mellitus with hyperglycemia: Secondary | ICD-10-CM | POA: Diagnosis not present

## 2021-04-10 DIAGNOSIS — I152 Hypertension secondary to endocrine disorders: Secondary | ICD-10-CM

## 2021-04-10 DIAGNOSIS — E1159 Type 2 diabetes mellitus with other circulatory complications: Secondary | ICD-10-CM

## 2021-04-10 DIAGNOSIS — Z6834 Body mass index (BMI) 34.0-34.9, adult: Secondary | ICD-10-CM

## 2021-04-10 NOTE — Assessment & Plan Note (Signed)

## 2021-04-10 NOTE — Assessment & Plan Note (Deleted)
>>  ASSESSMENT AND PLAN FOR DIABETES TYPE 2, UNCONTROLLED WRITTEN ON 04/10/2021 10:25 AM BY MASOUD, JAVED, MD  - The patient's blood sugar is labile on med. - The patient will continue the current treatment regimen.  - I encouraged the patient to regularly check blood sugar.  - I encouraged the patient to monitor diet. I encouraged the patient to eat low-carb and low-sugar to help prevent blood sugar spikes.  - I encouraged the patient to continue following their prescribed treatment plan for diabetes - I informed the patient to get help if blood sugar drops below 54mg /dL, or if suddenly have trouble thinking clearly or breathing.  Patient was advised to buy a book on diabetes from a local bookstore or from .  Patient should read 2 chapters every day to keep the motivation going, this is in addition to some of the materials we provided them from the office.  There are other resources on the Internet like YouTube and wilkipedia to get an education on the diabetes

## 2021-04-10 NOTE — Assessment & Plan Note (Signed)

## 2021-04-10 NOTE — Progress Notes (Signed)
New Patient Office Visit  Subjective:  Patient ID: Cindy Carson, female    DOB: 15-Oct-1970  Age: 50 y.o. MRN: [id number]  CC:  Chief Complaint  Patient presents with   New Patient (Initial Visit)    Patient here to establish care, she is transferring from her current cardiologist.     HPI Patient presents for sleep apnoea, uses CPAP  machine , it is on auto set  Past Medical History:  Diagnosis Date   Anemia 12/09/2018   Chronic low back pain    Chronic neck pain    Diabetes type 2, uncontrolled ([place])    diagnosed in 2019   Hypertension complicating diabetes ([place])    Obesity 12/09/2018   OSA (obstructive sleep apnea)    sleep study done in [place] in 2019 per pt     Current Outpatient Medications:    amLODipine (NORVASC) 5 MG tablet, Take 1 tablet (5 mg total) by mouth daily., Disp: 30 tablet, Rfl: 0   atorvastatin (LIPITOR) 10 MG tablet, Take 1 tablet (10 mg total) by mouth daily. (Patient taking differently: Take 10 mg by mouth daily. Patient states they take 40 mg), Disp: 30 tablet, Rfl: 0   Blood Glucose Monitoring Suppl (ONETOUCH VERIO IQ SYSTEM) w/Device KIT, Test twice a day, one touch verio flex meter, Disp: 1 kit, Rfl: 0   DULoxetine (CYMBALTA) 60 MG capsule, Take 1 capsule (60 mg total) by mouth daily., Disp: 30 capsule, Rfl: 0   glipiZIDE (GLUCOTROL) 5 MG tablet, Take 5 mg by mouth at bedtime., Disp: , Rfl:    losartan (COZAAR) 25 MG tablet, Take 25 mg by mouth daily., Disp: , Rfl:    OZEMPIC, 0.25 OR 0.5 MG/DOSE, 2 MG/1.5ML SOPN, Inject 0.25 mg into the skin once a week., Disp: , Rfl:    dapagliflozin propanediol (FARXIGA) 5 MG TABS tablet, Take 5 mg by mouth daily., Disp: 30 tablet, Rfl: 0   Past Surgical History:  Procedure Laterality Date   COLONOSCOPY     TUBAL LIGATION      Family History  Problem Relation Age of Onset   Diabetes Sister     Social History   Socioeconomic History   Marital status: Married    Spouse name: Not on file   Number of  children: Not on file   Years of education: Not on file   Highest education level: Not on file  Occupational History   Not on file  Tobacco Use   Smoking status: Never   Smokeless tobacco: Never  Substance and Sexual Activity   Alcohol use: Never   Drug use: Never   Sexual activity: Yes    Birth control/protection: Surgical  Other Topics Concern   Not on file  Social History Narrative   Not on file   Social Determinants of Health   Financial Resource Strain: Not on file  Food Insecurity: Not on file  Transportation Needs: Not on file  Physical Activity: Not on file  Stress: Not on file  Social Connections: Not on file  Intimate Partner Violence: Not on file    ROS Review of Systems  Constitutional: Negative.  Negative for appetite change.  HENT: Negative.    Eyes: Negative.   Respiratory:  Positive for apnea. Negative for shortness of breath and wheezing.   Cardiovascular: Negative.   Gastrointestinal:  Positive for abdominal pain.  Endocrine: Negative.   Genitourinary: Negative.   Musculoskeletal: Negative.   Allergic/Immunologic: Negative.   Neurological: Negative.   Psychiatric/Behavioral: Negative.  Negative for dysphoric mood and self-injury.    Objective:   Today's Vitals: BP (!) 142/92   Pulse 87   Ht 5' 2"  (1.575 m)   Wt 193 lb 6.4 oz (87.7 kg)   BMI 35.37 kg/m   Physical Exam Constitutional:      Appearance: Normal appearance. She is obese.  HENT:     Head: Normocephalic.     Nose: Nose normal.     Mouth/Throat:     Mouth: Mucous membranes are moist.  Eyes:     Pupils: Pupils are equal, round, and reactive to light.  Cardiovascular:     Rate and Rhythm: Normal rate and regular rhythm.  Pulmonary:     Effort: Pulmonary effort is normal.     Breath sounds: Normal breath sounds.  Abdominal:     General: Abdomen is flat. Bowel sounds are normal.     Palpations: Abdomen is soft.  Musculoskeletal:        General: Normal range of motion.      Cervical back: Normal range of motion.  Skin:    General: Skin is warm.  Neurological:     General: No focal deficit present.     Mental Status: She is alert.  Psychiatric:        Mood and Affect: Mood normal.    Assessment & Plan:   Problem List Items Addressed This Visit       Cardiovascular and Mediastinum   HTN (hypertension) - Primary     Patient denies any chest pain or shortness of breath there is no history of palpitation or paroxysmal nocturnal dyspnea   patient was advised to follow low-salt low-cholesterol diet    ideally I want to keep systolic blood pressure below 130 mmHg, patient was asked to check blood pressure one times a week and give me a report on that.  Patient will be follow-up in 3 months  or earlier as needed, patient will call me back for any change in the cardiovascular symptoms Patient was advised to buy a book from local bookstore concerning blood pressure and read several chapters  every day.  This will be supplemented by some of the material we will give him from the office.  Patient should also utilize other resources like YouTube and Internet to learn more about the blood pressure and the diet.       Relevant Medications   losartan (COZAAR) 25 MG tablet     Respiratory   OSA (obstructive sleep apnea)    Patient was advised to keep on using CPAP machine         Endocrine   Diabetes type 2, uncontrolled ([place])    - The patient's blood sugar is labile on med. - The patient will continue the current treatment regimen.  - I encouraged the patient to regularly check blood sugar.  - I encouraged the patient to monitor diet. I encouraged the patient to eat low-carb and low-sugar to help prevent blood sugar spikes.  - I encouraged the patient to continue following their prescribed treatment plan for diabetes - I informed the patient to get help if blood sugar drops below 27m/dL, or if suddenly have trouble thinking clearly or breathing.  Patient was  advised to buy a book on diabetes from a local bookstore or from [place]  Patient should read 2 chapters every day to keep the motivation going, this is in addition to some of the materials we provided them from the office.  There are  other resources on the Internet like YouTube and wilkipedia to get an education on the diabetes       Relevant Medications   losartan (COZAAR) 25 MG tablet   glipiZIDE (GLUCOTROL) 5 MG tablet   OZEMPIC, 0.25 OR 0.5 MG/DOSE, 2 MG/1.5ML SOPN     Other   Obesity    - I encouraged the patient to lose weight.  - I educated them on making healthy dietary choices including eating more fruits and vegetables and less fried foods. - I encouraged the patient to exercise more, and educated on the benefits of exercise including weight loss, diabetes prevention, and hypertension prevention.   Dietary counseling with a registered dietician  Referral to a weight management support group (e.g. Weight Watchers, Overeaters Anonymous)  If your BMI is greater than 29 or you have gained more than 15 pounds you should work on weight loss.  Attend a healthy cooking class        Relevant Medications   glipiZIDE (GLUCOTROL) 5 MG tablet   OZEMPIC, 0.25 OR 0.5 MG/DOSE, 2 MG/1.5ML SOPN    Outpatient Encounter Medications as of 04/10/2021  Medication Sig   amLODipine (NORVASC) 5 MG tablet Take 1 tablet (5 mg total) by mouth daily.   atorvastatin (LIPITOR) 10 MG tablet Take 1 tablet (10 mg total) by mouth daily. (Patient taking differently: Take 10 mg by mouth daily. Patient states they take 40 mg)   Blood Glucose Monitoring Suppl (ONETOUCH VERIO IQ SYSTEM) w/Device KIT Test twice a day, one touch verio flex meter   DULoxetine (CYMBALTA) 60 MG capsule Take 1 capsule (60 mg total) by mouth daily.   glipiZIDE (GLUCOTROL) 5 MG tablet Take 5 mg by mouth at bedtime.   losartan (COZAAR) 25 MG tablet Take 25 mg by mouth daily.   OZEMPIC, 0.25 OR 0.5 MG/DOSE, 2 MG/1.5ML SOPN Inject 0.25 mg  into the skin once a week.   dapagliflozin propanediol (FARXIGA) 5 MG TABS tablet Take 5 mg by mouth daily.   [DISCONTINUED] losartan-hydrochlorothiazide (HYZAAR) 100-12.5 MG tablet Take 1 tablet by mouth daily.   [DISCONTINUED] metFORMIN (GLUCOPHAGE) 1000 MG tablet Take 1 tablet (1,000 mg total) by mouth 2 (two) times daily with a meal.   No facility-administered encounter medications on file as of 04/10/2021.    Follow-up: No follow-ups on file.   Cletis Athens, MD

## 2021-04-10 NOTE — Assessment & Plan Note (Signed)
Patient was advised to keep on using CPAP machine

## 2021-04-10 NOTE — Assessment & Plan Note (Signed)

## 2021-06-19 DIAGNOSIS — H9041 Sensorineural hearing loss, unilateral, right ear, with unrestricted hearing on the contralateral side: Secondary | ICD-10-CM | POA: Diagnosis not present

## 2021-06-19 DIAGNOSIS — H9313 Tinnitus, bilateral: Secondary | ICD-10-CM | POA: Diagnosis not present

## 2021-06-19 DIAGNOSIS — H6981 Other specified disorders of Eustachian tube, right ear: Secondary | ICD-10-CM | POA: Diagnosis not present

## 2021-07-10 ENCOUNTER — Ambulatory Visit: Payer: BC Managed Care – PPO | Admitting: Internal Medicine

## 2021-07-11 ENCOUNTER — Other Ambulatory Visit (HOSPITAL_COMMUNITY): Payer: Self-pay | Admitting: Occupational Medicine

## 2021-07-11 ENCOUNTER — Ambulatory Visit (INDEPENDENT_AMBULATORY_CARE_PROVIDER_SITE_OTHER): Payer: BC Managed Care – PPO | Admitting: Internal Medicine

## 2021-07-11 ENCOUNTER — Other Ambulatory Visit: Payer: Self-pay

## 2021-07-11 ENCOUNTER — Encounter: Payer: Self-pay | Admitting: Internal Medicine

## 2021-07-11 ENCOUNTER — Ambulatory Visit (HOSPITAL_COMMUNITY)
Admission: RE | Admit: 2021-07-11 | Discharge: 2021-07-11 | Disposition: A | Discharge status: Home / Self Care | Payer: Self-pay | Source: Ambulatory Visit | Attending: Occupational Medicine | Admitting: Occupational Medicine

## 2021-07-11 VITALS — BP 126/82 | HR 99 | Temp 98.2°F | Ht 62.99 in | Wt 199.2 lb

## 2021-07-11 DIAGNOSIS — Z1239 Encounter for other screening for malignant neoplasm of breast: Secondary | ICD-10-CM | POA: Diagnosis not present

## 2021-07-11 DIAGNOSIS — M542 Cervicalgia: Secondary | ICD-10-CM

## 2021-07-11 DIAGNOSIS — R7611 Nonspecific reaction to tuberculin skin test without active tuberculosis: Secondary | ICD-10-CM

## 2021-07-11 DIAGNOSIS — E785 Hyperlipidemia, unspecified: Secondary | ICD-10-CM

## 2021-07-11 DIAGNOSIS — E1169 Type 2 diabetes mellitus with other specified complication: Secondary | ICD-10-CM | POA: Diagnosis not present

## 2021-07-11 HISTORY — DX: Cervicalgia: M54.2

## 2021-07-11 LAB — BAYER DCA HB A1C WAIVED: HB A1C (BAYER DCA - WAIVED): 7 % — ABNORMAL HIGH (ref 4.8–5.6)

## 2021-07-11 NOTE — Progress Notes (Signed)
BP 126/82   Pulse 99   Temp 98.2 F (36.8 C) (Oral)   Ht 5' 2.99" (1.6 m)   Wt 199 lb 3.2 oz (90.4 kg)   SpO2 97%   BMI 35.30 kg/m    Subjective:    Patient ID: Cindy Carson, female    DOB: 1971-03-21, 50 y.o.   MRN: [id number]  Chief Complaint  Patient presents with   New Patient (Initial Visit)   Diabetes   Hypertension    HPI: Cindy Carson is a 50 y.o. female  Moved from [place] , is originally from [place] DM/ HTN/ [place] neuropathy c/o neck pain and has a ho carperl tunnel syndrome s/p surg in left hand.   Diabetes She presents for her initial (is on glipizide for such.is on ozempic) diabetic visit. She has type 2 (a1c lower last time checked per pt - 8.0 was 12 when she started) diabetes mellitus. Her disease course has been worsening. Pertinent negatives for diabetes include no blurred vision, no chest pain, no fatigue, no foot ulcerations, no polydipsia, no polyphagia, no visual change, no weakness and no weight loss.  Hypertension This is a chronic problem. The current episode started more than 1 month ago. Pertinent negatives include no blurred vision or chest pain.   Chief Complaint  Patient presents with   New Patient (Initial Visit)   Diabetes   Hypertension    Relevant past medical, surgical, family and social history reviewed and updated as indicated. Interim medical history since our last visit reviewed. Allergies and medications reviewed and updated.  Review of Systems  Constitutional:  Negative for fatigue and weight loss.  Eyes:  Negative for blurred vision.  Cardiovascular:  Negative for chest pain.  Endocrine: Negative for polydipsia and polyphagia.  Neurological:  Negative for weakness.   Per HPI unless specifically indicated above     Objective:    BP 126/82   Pulse 99   Temp 98.2 F (36.8 C) (Oral)   Ht 5' 2.99" (1.6 m)   Wt 199 lb 3.2 oz (90.4 kg)   SpO2 97%   BMI 35.30 kg/m   Wt Readings from Last 3 Encounters:  07/11/21  199 lb 3.2 oz (90.4 kg)  04/10/21 193 lb 6.4 oz (87.7 kg)  05/18/19 196 lb (88.9 kg)    Physical Exam Vitals and nursing note reviewed.  Constitutional:      General: She is not in acute distress.    Appearance: Normal appearance. She is not ill-appearing or diaphoretic.  Eyes:     Conjunctiva/sclera: Conjunctivae normal.  Cardiovascular:     Rate and Rhythm: Tachycardia present. Rhythm irregular.     Heart sounds: No murmur heard.   No friction rub.  Pulmonary:     Effort: No respiratory distress.     Breath sounds: No stridor. No rhonchi.  Abdominal:     General: Abdomen is flat. Bowel sounds are normal. There is no distension.     Palpations: Abdomen is soft. There is no mass.     Tenderness: There is no abdominal tenderness. There is no guarding.  Musculoskeletal:        General: No swelling or tenderness.  Skin:    General: Skin is warm and dry.     Coloration: Skin is not jaundiced.     Findings: No erythema.  Neurological:     General: No focal deficit present.     Mental Status: She is alert.  Psychiatric:  Mood and Affect: Mood normal.        Behavior: Behavior normal.        Thought Content: Thought content normal.    Results for orders placed or performed in visit on 03/30/19  CBC with Differential/Platelet  Result Value Ref Range   WBC 5.7 3.4 - 10.8 x10E3/uL   RBC 4.85 3.77 - 5.28 x10E6/uL   Hemoglobin 12.7 11.1 - 15.9 g/dL   Hematocrit 37.6 34.0 - 46.6 %   MCV 78 (L) 79 - 97 fL   MCH 26.2 (L) 26.6 - 33.0 pg   MCHC 33.8 31.5 - 35.7 g/dL   RDW 14.1 11.7 - 15.4 %   Platelets 362 150 - 450 x10E3/uL   Neutrophils 63 Not Estab. %   Lymphs 26 Not Estab. %   Monocytes 8 Not Estab. %   Eos 2 Not Estab. %   Basos 1 Not Estab. %   Neutrophils Absolute 3.6 1.4 - 7.0 x10E3/uL   Lymphocytes Absolute 1.5 0.7 - 3.1 x10E3/uL   Monocytes Absolute 0.4 0.1 - 0.9 x10E3/uL   EOS (ABSOLUTE) 0.1 0.0 - 0.4 x10E3/uL   Basophils Absolute 0.1 0.0 - 0.2 x10E3/uL    Immature Granulocytes 0 Not Estab. %   Immature Grans (Abs) 0.0 0.0 - 0.1 x10E3/uL  Comprehensive metabolic panel  Result Value Ref Range   Glucose 249 (H) 65 - 99 mg/dL   BUN 6 6 - 24 mg/dL   Creatinine, Ser 0.86 0.57 - 1.00 mg/dL   GFR calc non Af Amer 80 >59 mL/min/1.73   GFR calc Af Amer 92 >59 mL/min/1.73   BUN/Creatinine Ratio 7 (L) 9 - 23   Sodium 138 134 - 144 mmol/L   Potassium 5.8 (H) 3.5 - 5.2 mmol/L   Chloride 101 96 - 106 mmol/L   CO2 19 (L) 20 - 29 mmol/L   Calcium 9.1 8.7 - 10.2 mg/dL   Total Protein 7.2 6.0 - 8.5 g/dL   Albumin 4.2 3.8 - 4.8 g/dL   Globulin, Total 3.0 1.5 - 4.5 g/dL   Albumin/Globulin Ratio 1.4 1.2 - 2.2   Bilirubin Total 0.2 0.0 - 1.2 mg/dL   Alkaline Phosphatase 88 39 - 117 IU/L   AST 12 0 - 40 IU/L   ALT 15 0 - 32 IU/L  TSH  Result Value Ref Range   TSH 1.050 0.450 - 4.500 uIU/mL  T4, free  Result Value Ref Range   Free T4 1.23 0.82 - 1.77 ng/dL  Lipid panel  Result Value Ref Range   Cholesterol, Total 138 100 - 199 mg/dL   Triglycerides 189 (H) 0 - 149 mg/dL   HDL 41 >39 mg/dL   VLDL Cholesterol Cal 38 5 - 40 mg/dL   LDL Calculated 59 0 - 99 mg/dL   Chol/HDL Ratio 3.4 0.0 - 4.4 ratio  Hemoglobin A1c  Result Value Ref Range   Hgb A1c MFr Bld 8.4 (H) 4.8 - 5.6 %   Est. average glucose Bld gHb Est-mCnc 194 mg/dL        Current Outpatient Medications:    amLODipine (NORVASC) 5 MG tablet, Take 1 tablet (5 mg total) by mouth daily., Disp: 30 tablet, Rfl: 0   atorvastatin (LIPITOR) 10 MG tablet, Take 1 tablet (10 mg total) by mouth daily. (Patient taking differently: Take 10 mg by mouth daily. Patient states they take 40 mg), Disp: 30 tablet, Rfl: 0   Blood Glucose Monitoring Suppl (ONETOUCH VERIO IQ SYSTEM) w/Device KIT, Test twice a  day, one touch verio flex meter, Disp: 1 kit, Rfl: 0   DULoxetine (CYMBALTA) 60 MG capsule, Take 1 capsule (60 mg total) by mouth daily., Disp: 30 capsule, Rfl: 0   fluticasone (FLONASE) 50 MCG/ACT nasal  spray, Place 2 sprays into both nostrils daily., Disp: , Rfl:    glipiZIDE (GLUCOTROL) 5 MG tablet, Take 5 mg by mouth at bedtime., Disp: , Rfl:    losartan (COZAAR) 25 MG tablet, Take 25 mg by mouth daily., Disp: , Rfl:    OZEMPIC, 0.25 OR 0.5 MG/DOSE, 2 MG/1.5ML SOPN, Inject 0.25 mg into the skin once a week., Disp: , Rfl:     Assessment & Plan:  DM : Is on glipizide and ozempic. Confirm Hold glipizide.  A1c stat checked today was 7.0  Will need to confirm dose of ozempic and will ? Need farxiga Will check  urine  microalbumin  diabetic diet plan given to pt  adviced regarding hypoglycemia and instructions given to pt today on how to prevent and treat the same if it were to occur. pt acknowledges the plan and voices understanding of the same.  exercise plan given and encouraged.   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    Problem List Items Addressed This Visit   None Visit Diagnoses     Encounter for screening for malignant neoplasm of breast, unspecified screening modality    -  Primary        Orders Placed This Encounter  Procedures   MM DIAG BREAST TOMO BILATERAL   CBC with Differential/Platelet   Comprehensive metabolic panel   Urinalysis, Routine w reflex microscopic   Microalbumin, Urine Waived   Bayer DCA Hb A1c Waived   Lipid panel   TSH   Ambulatory referral to Orthopedics     No orders of the defined types were placed in this encounter.    Follow up plan: No follow-ups on file.  Health Maintenance   Mammogram: will order mammogram  DEXA: next visit.  Cscope  - ? Orfer next visit.

## 2021-07-11 NOTE — Patient Instructions (Signed)

## 2021-07-16 ENCOUNTER — Other Ambulatory Visit: Payer: BC Managed Care – PPO

## 2021-07-16 ENCOUNTER — Other Ambulatory Visit: Payer: Self-pay

## 2021-07-16 DIAGNOSIS — E1169 Type 2 diabetes mellitus with other specified complication: Secondary | ICD-10-CM

## 2021-07-16 DIAGNOSIS — E785 Hyperlipidemia, unspecified: Secondary | ICD-10-CM | POA: Diagnosis not present

## 2021-07-17 LAB — LIPID PANEL
Chol/HDL Ratio: 4.9 ratio — ABNORMAL HIGH (ref 0.0–4.4)
Cholesterol, Total: 193 mg/dL (ref 100–199)
HDL: 39 mg/dL — ABNORMAL LOW (ref >39)
LDL Chol Calc (NIH): 114 mg/dL — ABNORMAL HIGH (ref 0–99)
Triglycerides: 229 mg/dL — ABNORMAL HIGH (ref 0–149)
VLDL Cholesterol Cal: 40 mg/dL (ref 5–40)

## 2021-07-22 DIAGNOSIS — S29012A Strain of muscle and tendon of back wall of thorax, initial encounter: Secondary | ICD-10-CM | POA: Diagnosis not present

## 2021-07-22 DIAGNOSIS — M542 Cervicalgia: Secondary | ICD-10-CM | POA: Diagnosis not present

## 2021-07-23 ENCOUNTER — Ambulatory Visit: Payer: BC Managed Care – PPO | Admitting: Internal Medicine

## 2021-07-25 ENCOUNTER — Ambulatory Visit: Payer: BC Managed Care – PPO | Admitting: Internal Medicine

## 2021-08-01 ENCOUNTER — Other Ambulatory Visit: Payer: Self-pay

## 2021-08-01 ENCOUNTER — Ambulatory Visit (INDEPENDENT_AMBULATORY_CARE_PROVIDER_SITE_OTHER): Payer: BC Managed Care – PPO | Admitting: Internal Medicine

## 2021-08-01 ENCOUNTER — Encounter: Payer: Self-pay | Admitting: Internal Medicine

## 2021-08-01 VITALS — BP 125/85 | HR 82 | Temp 98.4°F | Ht 62.99 in | Wt 196.2 lb

## 2021-08-01 DIAGNOSIS — E119 Type 2 diabetes mellitus without complications: Secondary | ICD-10-CM | POA: Insufficient documentation

## 2021-08-01 DIAGNOSIS — E785 Hyperlipidemia, unspecified: Secondary | ICD-10-CM | POA: Insufficient documentation

## 2021-08-01 DIAGNOSIS — E1169 Type 2 diabetes mellitus with other specified complication: Secondary | ICD-10-CM | POA: Diagnosis not present

## 2021-08-01 DIAGNOSIS — Z1211 Encounter for screening for malignant neoplasm of colon: Secondary | ICD-10-CM | POA: Insufficient documentation

## 2021-08-01 HISTORY — DX: Encounter for screening for malignant neoplasm of colon: Z12.11

## 2021-08-01 HISTORY — DX: Hyperlipidemia, unspecified: E78.5

## 2021-08-01 LAB — MICROALBUMIN, URINE WAIVED
Creatinine, Urine Waived: 200 mg/dL (ref 10–300)
Microalb, Ur Waived: 80 mg/L — ABNORMAL HIGH (ref 0–19)

## 2021-08-01 LAB — URINALYSIS, ROUTINE W REFLEX MICROSCOPIC
Bilirubin, UA: NEGATIVE
Glucose, UA: NEGATIVE
Ketones, UA: NEGATIVE
Leukocytes,UA: NEGATIVE
Nitrite, UA: NEGATIVE
RBC, UA: NEGATIVE
Specific Gravity, UA: 1.02 (ref 1.005–1.030)
Urobilinogen, Ur: 0.2 mg/dL (ref 0.2–1.0)
pH, UA: 6.5 (ref 5.0–7.5)

## 2021-08-01 MED ORDER — ATORVASTATIN CALCIUM 20 MG PO TABS: 20.0000 mg | ORAL_TABLET | Freq: Every day | ORAL | 4 refills | Status: DC

## 2021-08-01 NOTE — Progress Notes (Signed)
BP 125/85   Pulse 82   Temp 98.4 F (36.9 C) (Oral)   Ht 5' 2.99" (1.6 m)   Wt 196 lb 3.2 oz (89 kg)   SpO2 99%   BMI 34.76 kg/m    Subjective:    Patient ID: Cindy Carson, female    DOB: Mar 02, 1971, 50 y.o.   MRN: [id number]  Chief Complaint  Patient presents with   Diabetes   Hyperlipidemia   Lab work Review    HPI: Cindy Carson is a 50 y.o. female  Diabetes Pertinent negatives for hypoglycemia include no confusion, dizziness, headaches, nervousness/anxiousness or speech difficulty. Pertinent negatives for diabetes include no chest pain, no fatigue, no polydipsia, no polyphagia, no polyuria and no weakness.  Hyperlipidemia Pertinent negatives include no chest pain or shortness of breath.   Chief Complaint  Patient presents with   Diabetes   Hyperlipidemia   Lab work Review    Relevant past medical, surgical, family and social history reviewed and updated as indicated. Interim medical history since our last visit reviewed. Allergies and medications reviewed and updated.  Review of Systems  Constitutional:  Negative for activity change, appetite change, chills, fatigue and fever.  HENT:  Negative for congestion.   Eyes:  Negative for visual disturbance.  Respiratory:  Negative for apnea, cough, chest tightness, shortness of breath and wheezing.   Cardiovascular:  Negative for chest pain, palpitations and leg swelling.  Gastrointestinal:  Negative for abdominal distention, abdominal pain, diarrhea and nausea.  Endocrine: Negative for cold intolerance, heat intolerance, polydipsia, polyphagia and polyuria.  Genitourinary:  Negative for difficulty urinating, frequency, hematuria and urgency.  Skin:  Negative for color change and rash.  Neurological:  Negative for dizziness, speech difficulty, weakness, light-headedness, numbness and headaches.  Psychiatric/Behavioral:  Negative for behavioral problems and confusion. The patient is not nervous/anxious.    Per HPI unless  specifically indicated above     Objective:    BP 125/85   Pulse 82   Temp 98.4 F (36.9 C) (Oral)   Ht 5' 2.99" (1.6 m)   Wt 196 lb 3.2 oz (89 kg)   SpO2 99%   BMI 34.76 kg/m   Wt Readings from Last 3 Encounters:  08/01/21 196 lb 3.2 oz (89 kg)  07/11/21 199 lb 3.2 oz (90.4 kg)  04/10/21 193 lb 6.4 oz (87.7 kg)    Physical Exam Vitals and nursing note reviewed.  Constitutional:      General: She is not in acute distress.    Appearance: Normal appearance. She is not ill-appearing or diaphoretic.  Eyes:     Conjunctiva/sclera: Conjunctivae normal.  Cardiovascular:     Rate and Rhythm: Normal rate and regular rhythm.     Heart sounds: No murmur heard.   No friction rub. No gallop.  Pulmonary:     Effort: No respiratory distress.     Breath sounds: No wheezing, rhonchi or rales.  Abdominal:     General: Abdomen is flat. Bowel sounds are normal. There is no distension.     Palpations: Abdomen is soft. There is no mass.     Tenderness: There is no abdominal tenderness. There is no guarding.  Skin:    General: Skin is warm and dry.     Coloration: Skin is not jaundiced.     Findings: No erythema.  Neurological:     Mental Status: She is alert.    Results for orders placed or performed in visit on 07/16/21  Lipid panel  Result  Value Ref Range   Cholesterol, Total 193 100 - 199 mg/dL   Triglycerides 229 (H) 0 - 149 mg/dL   HDL 39 (L) >39 mg/dL   VLDL Cholesterol Cal 40 5 - 40 mg/dL   LDL Chol Calc (NIH) 114 (H) 0 - 99 mg/dL   Chol/HDL Ratio 4.9 (H) 0.0 - 4.4 ratio        Current Outpatient Medications:    amLODipine (NORVASC) 5 MG tablet, Take 1 tablet (5 mg total) by mouth daily., Disp: 30 tablet, Rfl: 0   atorvastatin (LIPITOR) 20 MG tablet, Take 1 tablet (20 mg total) by mouth daily., Disp: 30 tablet, Rfl: 4   Blood Glucose Monitoring Suppl (ONETOUCH VERIO IQ SYSTEM) w/Device KIT, Test twice a day, one touch verio flex meter, Disp: 1 kit, Rfl: 0   DULoxetine  (CYMBALTA) 60 MG capsule, Take 1 capsule (60 mg total) by mouth daily., Disp: 30 capsule, Rfl: 0   fluticasone (FLONASE) 50 MCG/ACT nasal spray, Place 2 sprays into both nostrils daily., Disp: , Rfl:    losartan (COZAAR) 25 MG tablet, Take 25 mg by mouth daily., Disp: , Rfl:    OZEMPIC, 0.25 OR 0.5 MG/DOSE, 2 MG/1.5ML SOPN, Inject 0.25 mg into the skin once a week., Disp: , Rfl:     Assessment & Plan:  DM is only on ozempic now at 0.5 was on 0.25 , increase next viist. check HbA1c,  urine  microalbumin  diabetic diet plan given to pt  adviced regarding hypoglycemia and instructions given to pt today on how to prevent and treat the same if it were to occur. pt acknowledges the plan and voices understanding of the same.  exercise plan given and encouraged.   advice diabetic yearly podiatry, ophthalmology , nutritionist , dental check q 6 months,  2. HLD was on lipitor will need to re add and increase to 20 mg.  recheck FLP, check LFT's work on diet, SE of meds explained to pt. low fat and high fiber diet explained to pt.   3. HTN Continue current meds.  Medication compliance emphasised. pt advised to keep Bp logs. Pt verbalised understanding of the same. Pt to have a low salt diet . Exercise to reach a goal of at least 150 mins a week.  lifestyle modifications explained and pt understands importance of the above.   Problem List Items Addressed This Visit       Endocrine   Diabetes mellitus without complication (HCC)   Relevant Medications   atorvastatin (LIPITOR) 20 MG tablet   Other Relevant Orders   Lipid panel   Bayer DCA Hb A1c Waived   Lipid panel   Basic metabolic panel   Vitamin G26     Other   Hyperlipidemia   Relevant Medications   atorvastatin (LIPITOR) 20 MG tablet   Other Relevant Orders   Lipid panel   Bayer DCA Hb A1c Waived   Lipid panel   Basic metabolic panel   Vitamin R48   Screening for colon cancer - Primary   Relevant Orders   Ambulatory referral to  Gastroenterology     Orders Placed This Encounter  Procedures   Lipid panel   Bayer DCA Hb A1c Waived   Lipid panel   Basic metabolic panel   Vitamin N46   Ambulatory referral to Gastroenterology     Meds ordered this encounter  Medications   atorvastatin (LIPITOR) 20 MG tablet    Sig: Take 1 tablet (20 mg total) by mouth  daily.    Dispense:  30 tablet    Refill:  4     Follow up plan: Return in about 6 weeks (around 09/12/2021).

## 2021-08-02 LAB — COMPREHENSIVE METABOLIC PANEL
ALT: 18 IU/L (ref 0–32)
AST: 14 IU/L (ref 0–40)
Albumin/Globulin Ratio: 1.5 (ref 1.2–2.2)
Albumin: 4.4 g/dL (ref 3.8–4.9)
Alkaline Phosphatase: 119 IU/L (ref 44–121)
BUN/Creatinine Ratio: 9 (ref 9–23)
BUN: 7 mg/dL (ref 6–24)
Bilirubin Total: 0.3 mg/dL (ref 0.0–1.2)
CO2: 26 mmol/L (ref 20–29)
Calcium: 9.7 mg/dL (ref 8.7–10.2)
Chloride: 102 mmol/L (ref 96–106)
Creatinine, Ser: 0.8 mg/dL (ref 0.57–1.00)
Globulin, Total: 3 g/dL (ref 1.5–4.5)
Glucose: 115 mg/dL — ABNORMAL HIGH (ref 70–99)
Potassium: 4.3 mmol/L (ref 3.5–5.2)
Sodium: 140 mmol/L (ref 134–144)
Total Protein: 7.4 g/dL (ref 6.0–8.5)
eGFR: 89 mL/min/{1.73_m2} (ref >59)

## 2021-08-02 LAB — CBC WITH DIFFERENTIAL/PLATELET
Basophils Absolute: 0.1 10*3/uL (ref 0.0–0.2)
Basos: 1 %
EOS (ABSOLUTE): 0.1 10*3/uL (ref 0.0–0.4)
Eos: 3 %
Hematocrit: 41.9 % (ref 34.0–46.6)
Hemoglobin: 13.7 g/dL (ref 11.1–15.9)
Immature Grans (Abs): 0 10*3/uL (ref 0.0–0.1)
Immature Granulocytes: 0 %
Lymphocytes Absolute: 2 10*3/uL (ref 0.7–3.1)
Lymphs: 39 %
MCH: 26 pg — ABNORMAL LOW (ref 26.6–33.0)
MCHC: 32.7 g/dL (ref 31.5–35.7)
MCV: 80 fL (ref 79–97)
Monocytes Absolute: 0.4 10*3/uL (ref 0.1–0.9)
Monocytes: 9 %
Neutrophils Absolute: 2.5 10*3/uL (ref 1.4–7.0)
Neutrophils: 48 %
Platelets: 404 10*3/uL (ref 150–450)
RBC: 5.26 x10E6/uL (ref 3.77–5.28)
RDW: 13.5 % (ref 11.7–15.4)
WBC: 5.1 10*3/uL (ref 3.4–10.8)

## 2021-08-02 LAB — TSH: TSH: 1.21 u[IU]/mL (ref 0.450–4.500)

## 2021-08-14 ENCOUNTER — Other Ambulatory Visit: Payer: Self-pay

## 2021-08-14 ENCOUNTER — Telehealth: Payer: Self-pay

## 2021-08-14 MED ORDER — NA SULFATE-K SULFATE-MG SULF 17.5-3.13-1.6 GM/177ML PO SOLN: 1.0000 | ORAL | 0 refills | Status: DC

## 2021-08-14 NOTE — Telephone Encounter (Signed)
Gastroenterology Pre-Procedure Review  Request Date: 09/03/21 Requesting Physician: Dr. Allen Norris  PATIENT REVIEW QUESTIONS: The patient responded to the following health history questions as indicated:    1. Are you having any GI issues? no 2. Do you have a personal history of Polyps? no 3. Do you have a family history of Colon Cancer or Polyps? no 4. Diabetes Mellitus? yes (Type 2) 5. Joint replacements in the past 12 months?no 6. Major health problems in the past 3 months?no 7. Any artificial heart valves, MVP, or defibrillator?no    MEDICATIONS & ALLERGIES:    Patient reports the following regarding taking any anticoagulation/antiplatelet therapy:   Plavix, Coumadin, Eliquis, Xarelto, Lovenox, Pradaxa, Brilinta, or Effient? no Aspirin? no  Patient confirms/reports the following medications:  Current Outpatient Medications  Medication Sig Dispense Refill   amLODipine (NORVASC) 5 MG tablet Take 1 tablet (5 mg total) by mouth daily. 30 tablet 0   atorvastatin (LIPITOR) 20 MG tablet Take 1 tablet (20 mg total) by mouth daily. 30 tablet 4   Blood Glucose Monitoring Suppl (ONETOUCH VERIO IQ SYSTEM) w/Device KIT Test twice a day, one touch verio flex meter 1 kit 0   DULoxetine (CYMBALTA) 60 MG capsule Take 1 capsule (60 mg total) by mouth daily. 30 capsule 0   fluticasone (FLONASE) 50 MCG/ACT nasal spray Place 2 sprays into both nostrils daily.     losartan (COZAAR) 25 MG tablet Take 25 mg by mouth daily.     OZEMPIC, 0.25 OR 0.5 MG/DOSE, 2 MG/1.5ML SOPN Inject 0.25 mg into the skin once a week.     No current facility-administered medications for this visit.    Patient confirms/reports the following allergies:  Allergies  Allergen Reactions   Nsaids     nausea    No orders of the defined types were placed in this encounter.   AUTHORIZATION INFORMATION Primary Insurance: 1D#: Group #:  Secondary Insurance: 1D#: Group #:  SCHEDULE INFORMATION: Date:  Time: Location:

## 2021-09-03 ENCOUNTER — Ambulatory Visit: Payer: BC Managed Care – PPO | Admitting: Anesthesiology

## 2021-09-03 ENCOUNTER — Encounter: Payer: Self-pay | Admitting: Gastroenterology

## 2021-09-03 ENCOUNTER — Encounter
Admission: RE | Disposition: A | Discharge status: Home / Self Care | Payer: Self-pay | Source: Home / Self Care | Attending: Gastroenterology

## 2021-09-03 ENCOUNTER — Ambulatory Visit
Admission: RE | Admit: 2021-09-03 | Discharge: 2021-09-03 | Disposition: A | Discharge status: Home / Self Care | Payer: BC Managed Care – PPO | Attending: Gastroenterology | Admitting: Gastroenterology

## 2021-09-03 ENCOUNTER — Other Ambulatory Visit: Payer: Self-pay

## 2021-09-03 DIAGNOSIS — K648 Other hemorrhoids: Secondary | ICD-10-CM | POA: Diagnosis not present

## 2021-09-03 DIAGNOSIS — E119 Type 2 diabetes mellitus without complications: Secondary | ICD-10-CM | POA: Diagnosis not present

## 2021-09-03 DIAGNOSIS — Z79899 Other long term (current) drug therapy: Secondary | ICD-10-CM | POA: Insufficient documentation

## 2021-09-03 DIAGNOSIS — I10 Essential (primary) hypertension: Secondary | ICD-10-CM | POA: Insufficient documentation

## 2021-09-03 DIAGNOSIS — G4730 Sleep apnea, unspecified: Secondary | ICD-10-CM | POA: Insufficient documentation

## 2021-09-03 DIAGNOSIS — Z1211 Encounter for screening for malignant neoplasm of colon: Secondary | ICD-10-CM | POA: Insufficient documentation

## 2021-09-03 HISTORY — PX: COLONOSCOPY: SHX5424

## 2021-09-03 LAB — GLUCOSE, CAPILLARY: Glucose-Capillary: 146 mg/dL — ABNORMAL HIGH (ref 70–99)

## 2021-09-03 SURGERY — COLONOSCOPY
Anesthesia: General

## 2021-09-03 MED ORDER — PROPOFOL 500 MG/50ML IV EMUL
INTRAVENOUS | Status: AC
  Filled 2021-09-03: qty 50

## 2021-09-03 MED ORDER — PROPOFOL 10 MG/ML IV BOLUS
INTRAVENOUS | Status: DC | PRN
  Administered 2021-09-03: 90 mg via INTRAVENOUS

## 2021-09-03 MED ORDER — PROPOFOL 500 MG/50ML IV EMUL
INTRAVENOUS | Status: DC | PRN
  Administered 2021-09-03: 150 ug/kg/min via INTRAVENOUS

## 2021-09-03 MED ORDER — DEXMEDETOMIDINE (PRECEDEX) IN NS 20 MCG/5ML (4 MCG/ML) IV SYRINGE
PREFILLED_SYRINGE | INTRAVENOUS | Status: DC | PRN
  Administered 2021-09-03: 8 ug via INTRAVENOUS

## 2021-09-03 MED ORDER — SODIUM CHLORIDE 0.9 % IV SOLN: INTRAVENOUS | Status: DC

## 2021-09-03 MED ORDER — LIDOCAINE HCL (CARDIAC) PF 100 MG/5ML IV SOSY
PREFILLED_SYRINGE | INTRAVENOUS | Status: DC | PRN
  Administered 2021-09-03: 40 mg via INTRAVENOUS

## 2021-09-03 NOTE — Op Note (Signed)
[facility] Patient Name: Cindy Carson Procedure Date: 09/03/2021 9:58 AM MRN: [id number] Account #: [id number] Date of Birth: 1970-11-04 Admit Type: Outpatient Age: 50 Room: [facility] ENDO ROOM 4 Gender: Female Note Status: Finalized Instrument Name: Prentice Docker [id number] Procedure:             Colonoscopy Indications:           Screening for colorectal malignant neoplasm Providers:             Midge Minium MD, MD Referring MD:          Loura Pardon (Referring MD) Medicines:             Propofol per Anesthesia Complications:         No immediate complications. Procedure:             Pre-Anesthesia Assessment:                        - Prior to the procedure, a History and Physical was                         performed, and patient medications and allergies were                         reviewed. The patient's tolerance of previous                         anesthesia was also reviewed. The risks and benefits                         of the procedure and the sedation options and risks                         were discussed with the patient. All questions were                         answered, and informed consent was obtained. Prior                         Anticoagulants: The patient has taken no previous                         anticoagulant or antiplatelet agents. ASA Grade                         Assessment: II - A patient with mild systemic disease.                         After reviewing the risks and benefits, the patient                         was deemed in satisfactory condition to undergo the                         procedure.                        After obtaining informed consent, the colonoscope was  passed under direct vision. Throughout the procedure,                         the patient's blood pressure, pulse, and oxygen                         saturations were monitored continuously. The                         Colonoscope  was introduced through the anus and                         advanced to the the cecum, identified by appendiceal                         orifice and ileocecal valve. The colonoscopy was                         performed without difficulty. The patient tolerated                         the procedure well. The quality of the bowel                         preparation was good. Findings:      The perianal and digital rectal examinations were normal.      Non-bleeding internal hemorrhoids were found during retroflexion. The       hemorrhoids were Grade I (internal hemorrhoids that do not prolapse). Impression:            - Non-bleeding internal hemorrhoids.                        - No specimens collected. Recommendation:        - Discharge patient to home.                        - Resume previous diet.                        - Continue present medications.                        - Repeat colonoscopy in 10 years for screening                         purposes. Procedure Code(s):     --- Professional ---                        [phone], Colonoscopy, flexible; diagnostic, including                         collection of specimen(s) by brushing or washing, when                         performed (separate procedure) Diagnosis Code(s):     --- Professional ---                        Z12.11, Encounter for screening for malignant neoplasm  of colon CPT copyright 2019 American Medical Association. All rights reserved. The codes documented in this report are preliminary and upon coder review may  be revised to meet current compliance requirements. Midge Minium MD, MD 09/03/2021 10:16:36 AM This report has been signed electronically. Number of Addenda: 0 Note Initiated On: 09/03/2021 9:58 AM Scope Withdrawal Time: 0 hours 6 minutes 16 seconds  Total Procedure Duration: 0 hours 9 minutes 22 seconds  Estimated Blood Loss:  Estimated blood loss: none.      [facility]

## 2021-09-03 NOTE — Transfer of Care (Signed)
Immediate Anesthesia Transfer of Care Note  Patient: Cindy Carson  Procedure(s) Performed: Procedure(s): COLONOSCOPY (N/A)  Patient Location: PACU and Endoscopy Unit  Anesthesia Type:General  Level of Consciousness: sedated  Airway & Oxygen Therapy: Patient Spontanous Breathing and Patient connected to nasal cannula oxygen  Post-op Assessment: Report given to RN and Post -op Vital signs reviewed and stable  Post vital signs: Reviewed and stable  Last Vitals:  Vitals:   09/03/21 0902  BP: (!) 140/97  Pulse: 91  Resp: 18  Temp: 36.4 C  SpO2: 98%    Complications: No apparent anesthesia complications

## 2021-09-03 NOTE — Anesthesia Postprocedure Evaluation (Signed)
Anesthesia Post Note  Patient: Cindy Carson  Procedure(s) Performed: COLONOSCOPY  Patient location during evaluation: Endoscopy Anesthesia Type: General Level of consciousness: awake and alert Pain management: pain level controlled Vital Signs Assessment: post-procedure vital signs reviewed and stable Respiratory status: spontaneous breathing, nonlabored ventilation and respiratory function stable Cardiovascular status: blood pressure returned to baseline and stable Postop Assessment: no apparent nausea or vomiting Anesthetic complications: no   No notable events documented.   Last Vitals:  Vitals:   09/03/21 1044 09/03/21 1107  BP: 116/83 (!) 133/94  Pulse:    Resp:    Temp: (!) 35.7 C   SpO2:      Last Pain:  Vitals:   09/03/21 1107  TempSrc:   PainSc: 9                  Aaliyah Cancro Romie Minus

## 2021-09-03 NOTE — Anesthesia Preprocedure Evaluation (Addendum)
Anesthesia Evaluation  Patient identified by MRN, date of birth, ID band Patient awake    Reviewed: Allergy & Precautions, H&P , NPO status , Patient's Chart, lab work & pertinent test results  Airway Mallampati: I  TM Distance: >3 FB Neck ROM: Full    Dental no notable dental hx.    Pulmonary sleep apnea ,    Pulmonary exam normal breath sounds clear to auscultation       Cardiovascular Exercise Tolerance: Good hypertension, Pt. on medications Normal cardiovascular exam Rhythm:Regular Rate:Normal     Neuro/Psych Depression Chronic low back pain Chronic neck pain      GI/Hepatic negative GI ROS, Neg liver ROS,   Endo/Other  diabetes (A1c 7), Well Controlled, Type 2  Renal/GU negative Renal ROS  negative genitourinary   Musculoskeletal negative musculoskeletal ROS (+)   Abdominal (+) + obese,   Peds negative pediatric ROS (+)  Hematology negative hematology ROS (+)   Anesthesia Other Findings   Reproductive/Obstetrics negative OB ROS                            Anesthesia Physical Anesthesia Plan  ASA: 2  Anesthesia Plan: General   Post-op Pain Management:    Induction: Intravenous  PONV Risk Score and Plan: 3 and TIVA and Treatment may vary due to age or medical condition  Airway Management Planned: Natural Airway and Simple Face Mask  Additional Equipment:   Intra-op Plan:   Post-operative Plan:   Informed Consent: I have reviewed the patients History and Physical, chart, labs and discussed the procedure including the risks, benefits and alternatives for the proposed anesthesia with the patient or authorized representative who has indicated his/her understanding and acceptance.     Dental advisory given  Plan Discussed with: Anesthesiologist and CRNA  Anesthesia Plan Comments:        Anesthesia Quick Evaluation

## 2021-09-03 NOTE — H&P (Signed)
Lucilla Lame, MD [address] [employer], [place] [address] Phone: [phone] Fax : [phone]  Primary Care Physician:  Charlynne Cousins, MD Primary Gastroenterologist:  Dr. Allen Norris  Pre-Procedure History & Physical: HPI:  Cindy Carson is a 50 y.o. female is here for a screening colonoscopy.   Past Medical History:  Diagnosis Date   Anemia 12/09/2018   Chronic low back pain    Chronic neck pain    Diabetes type 2, uncontrolled    diagnosed in 2019   Hypertension complicating diabetes ([place])    Obesity 12/09/2018   OSA (obstructive sleep apnea)    sleep study done in [place] in 2019 per pt    Past Surgical History:  Procedure Laterality Date   COLONOSCOPY     TUBAL LIGATION      Prior to Admission medications   Medication Sig Start Date End Date Taking? Authorizing Provider  amLODipine (NORVASC) 5 MG tablet Take 1 tablet (5 mg total) by mouth daily. 08/23/19  Yes Henson, Vickie L, PA-C  atorvastatin (LIPITOR) 20 MG tablet Take 1 tablet (20 mg total) by mouth daily. 08/01/21  Yes Vigg, Avanti, MD  DULoxetine (CYMBALTA) 60 MG capsule Take 1 capsule (60 mg total) by mouth daily. 08/23/19  Yes Henson, Vickie L, PA-C  OZEMPIC, 0.25 OR 0.5 MG/DOSE, 2 MG/1.5ML SOPN Inject 0.25 mg into the skin once a week. 03/13/21  Yes [provider]  Blood Glucose Monitoring Suppl (ONETOUCH VERIO IQ SYSTEM) w/Device KIT Test twice a day, one touch verio flex meter 12/16/18   Henson, Vickie L, PA-C  fluticasone (FLONASE) 50 MCG/ACT nasal spray Place 2 sprays into both nostrils daily. 06/19/21   [provider]  losartan (COZAAR) 25 MG tablet Take 25 mg by mouth daily.    [provider]  Na Sulfate-K Sulfate-Mg Sulf (SUPREP BOWEL PREP KIT) 17.5-3.13-1.6 GM/177ML SOLN Take 1 kit by mouth as directed. 08/14/21   Lucilla Lame, MD    Allergies as of 08/13/2021 - Review Complete 08/01/2021  Allergen Reaction Noted   Nsaids  12/09/2018    Family History  Problem  Relation Age of Onset   Varicose Veins Father    Diabetes Sister    Diabetes Sister    Heart murmur Brother     Social History   Socioeconomic History   Marital status: Legally Separated    Spouse name: Not on file   Number of children: Not on file   Years of education: Not on file   Highest education level: Not on file  Occupational History   Not on file  Tobacco Use   Smoking status: Never   Smokeless tobacco: Never  Vaping Use   Vaping Use: Never used  Substance and Sexual Activity   Alcohol use: Never   Drug use: Never   Sexual activity: Yes    Birth control/protection: Surgical  Other Topics Concern   Not on file  Social History Narrative   Not on file   Social Determinants of Health   Financial Resource Strain: Not on file  Food Insecurity: Not on file  Transportation Needs: Not on file  Physical Activity: Not on file  Stress: Not on file  Social Connections: Not on file  Intimate Partner Violence: Not on file    Review of Systems: See HPI, otherwise negative ROS  Physical Exam: BP (!) 140/97   Pulse 91   Temp 97.6 F (36.4 C) (Temporal)   Resp 18   Ht 5' 3"  (1.6 m)  Wt 86.2 kg   SpO2 98%   BMI 33.66 kg/m  General:   Alert,  pleasant and cooperative in NAD Head:  Normocephalic and atraumatic. Neck:  Supple; no masses or thyromegaly. Lungs:  Clear throughout to auscultation.    Heart:  Regular rate and rhythm. Abdomen:  Soft, nontender and nondistended. Normal bowel sounds, without guarding, and without rebound.   Neurologic:  Alert and  oriented x4;  grossly normal neurologically.  Impression/Plan: Cindy Carson is now here to undergo a screening colonoscopy.  Risks, benefits, and alternatives regarding colonoscopy have been reviewed with the patient.  Questions have been answered.  All parties agreeable.

## 2021-09-04 ENCOUNTER — Encounter: Payer: Self-pay | Admitting: Gastroenterology

## 2021-09-09 DIAGNOSIS — S29012D Strain of muscle and tendon of back wall of thorax, subsequent encounter: Secondary | ICD-10-CM | POA: Diagnosis not present

## 2021-09-09 DIAGNOSIS — M542 Cervicalgia: Secondary | ICD-10-CM | POA: Diagnosis not present

## 2021-09-12 ENCOUNTER — Ambulatory Visit: Payer: BC Managed Care – PPO | Admitting: Internal Medicine

## 2021-09-18 ENCOUNTER — Ambulatory Visit: Payer: BC Managed Care – PPO | Admitting: Internal Medicine

## 2021-09-24 ENCOUNTER — Ambulatory Visit: Payer: BC Managed Care – PPO | Admitting: Internal Medicine

## 2021-09-25 DIAGNOSIS — M542 Cervicalgia: Secondary | ICD-10-CM | POA: Diagnosis not present

## 2021-09-30 ENCOUNTER — Encounter: Payer: Self-pay | Admitting: Internal Medicine

## 2021-09-30 ENCOUNTER — Telehealth (INDEPENDENT_AMBULATORY_CARE_PROVIDER_SITE_OTHER): Payer: BC Managed Care – PPO | Admitting: Internal Medicine

## 2021-09-30 VITALS — BP 150/100 | HR 90

## 2021-09-30 DIAGNOSIS — M542 Cervicalgia: Secondary | ICD-10-CM | POA: Diagnosis not present

## 2021-09-30 MED ORDER — DULOXETINE HCL 60 MG PO CPEP: 60.0000 mg | ORAL_CAPSULE | Freq: Every day | ORAL | 3 refills | Status: DC

## 2021-09-30 NOTE — Progress Notes (Addendum)
BP (!) 150/100    Pulse 90    Subjective:    Patient ID: Cindy Carson, female    DOB: 06/07/1971, 51 y.o.   MRN: [id number]  Chief Complaint  Patient presents with   Medication Refill   Neck Pain    HPI: Cindy Carson is a 51 y.o. female  Neck Pain  This is a chronic (is on cymbalta for nerve pain from neck - OA in such per pt. she has had an MRI of the neck per ortho - done about last week) problem. The current episode started in the past 7 days. The problem occurs intermittently. The quality of the pain is described as shooting. Associated symptoms include tingling. Pertinent negatives include no chest pain, headaches, photophobia, syncope or trouble swallowing.   Chief Complaint  Patient presents with   Medication Refill   Neck Pain    Relevant past medical, surgical, family and social history reviewed and updated as indicated. Interim medical history since our last visit reviewed. Allergies and medications reviewed and updated.  Review of Systems  HENT:  Negative for trouble swallowing.   Eyes:  Negative for photophobia.  Cardiovascular:  Negative for chest pain and syncope.  Musculoskeletal:  Positive for neck pain.  Neurological:  Positive for tingling. Negative for headaches.   Per HPI unless specifically indicated above  Objective:    BP (!) 150/100    Pulse 90   Wt Readings from Last 3 Encounters:  09/03/21 190 lb (86.2 kg)  08/01/21 196 lb 3.2 oz (89 kg)  07/11/21 199 lb 3.2 oz (90.4 kg)    Physical Exam Unable to peform sec to virtual visit.   Results for orders placed or performed during the hospital encounter of 09/03/21  Glucose, capillary  Result Value Ref Range   Glucose-Capillary 146 (H) 70 - 99 mg/dL       Current Outpatient Medications:    amLODipine (NORVASC) 5 MG tablet, Take 1 tablet (5 mg total) by mouth daily., Disp: 30 tablet, Rfl: 0   atorvastatin (LIPITOR) 20 MG tablet, Take 1 tablet (20 mg total) by mouth daily., Disp: 30 tablet, Rfl: 4    Blood Glucose Monitoring Suppl (ONETOUCH VERIO IQ SYSTEM) w/Device KIT, Test twice a day, one touch verio flex meter, Disp: 1 kit, Rfl: 0   DULoxetine (CYMBALTA) 60 MG capsule, Take 1 capsule (60 mg total) by mouth daily., Disp: 30 capsule, Rfl: 0   fluticasone (FLONASE) 50 MCG/ACT nasal spray, Place 2 sprays into both nostrils daily., Disp: , Rfl:    losartan (COZAAR) 25 MG tablet, Take 25 mg by mouth daily., Disp: , Rfl:    OZEMPIC, 0.25 OR 0.5 MG/DOSE, 2 MG/1.5ML SOPN, Inject 0.25 mg into the skin once a week., Disp: , Rfl:    Na Sulfate-K Sulfate-Mg Sulf (SUPREP BOWEL PREP KIT) 17.5-3.13-1.6 GM/177ML SOLN, Take 1 kit by mouth as directed. (Patient not taking: Reported on 09/30/2021), Disp: 354 mL, Rfl: 0    Assessment & Plan:  Neck pain: Stable, chronic:  Fu and mx per ortho sees [facility] no results available to view. 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  Follow up plan: No follow-ups on file.     This visit was completed via telephone due to the restrictions of the COVID-19 pandemic. All issues as above were discussed and addressed but no physical exam was performed. If it was felt that the patient should be evaluated  in the office, they were directed there. The patient verbally consented to this visit. Patient was unable to complete an audio/visual visit due to Technical difficulties, Lack of internet. Due to the catastrophic nature of the COVID-19 pandemic, this visit was done through audio contact only. Location of the patient: home Location of the provider: work Those involved with this call:  Provider: Charlynne Cousins, MD CMA: Frazier Butt, [place] Desk/Registration: Myrlene Broker  Time spent on call:  10 minutes on the phone discussing health concerns. 10 minutes total spent in review of patient's record and preparation of their chart.

## 2021-10-18 DIAGNOSIS — S29012D Strain of muscle and tendon of back wall of thorax, subsequent encounter: Secondary | ICD-10-CM | POA: Diagnosis not present

## 2021-10-18 DIAGNOSIS — M542 Cervicalgia: Secondary | ICD-10-CM | POA: Diagnosis not present

## 2021-11-29 DIAGNOSIS — M5412 Radiculopathy, cervical region: Secondary | ICD-10-CM | POA: Diagnosis not present

## 2021-12-04 DIAGNOSIS — M542 Cervicalgia: Secondary | ICD-10-CM | POA: Diagnosis not present

## 2021-12-04 DIAGNOSIS — S29012D Strain of muscle and tendon of back wall of thorax, subsequent encounter: Secondary | ICD-10-CM | POA: Diagnosis not present

## 2021-12-09 DIAGNOSIS — M542 Cervicalgia: Secondary | ICD-10-CM | POA: Diagnosis not present

## 2021-12-09 DIAGNOSIS — S29012D Strain of muscle and tendon of back wall of thorax, subsequent encounter: Secondary | ICD-10-CM | POA: Diagnosis not present

## 2021-12-19 DIAGNOSIS — S29012D Strain of muscle and tendon of back wall of thorax, subsequent encounter: Secondary | ICD-10-CM | POA: Diagnosis not present

## 2021-12-19 DIAGNOSIS — M542 Cervicalgia: Secondary | ICD-10-CM | POA: Diagnosis not present

## 2021-12-23 ENCOUNTER — Other Ambulatory Visit: Payer: Self-pay

## 2021-12-23 NOTE — Telephone Encounter (Signed)
Last seen 08/01/21 ? ?No up coming visits ?

## 2021-12-24 MED ORDER — OZEMPIC (0.25 OR 0.5 MG/DOSE) 2 MG/1.5ML ~~LOC~~ SOPN: 0.2500 mg | PEN_INJECTOR | SUBCUTANEOUS | 0 refills | Status: AC

## 2021-12-25 DIAGNOSIS — M542 Cervicalgia: Secondary | ICD-10-CM | POA: Diagnosis not present

## 2022-01-01 DIAGNOSIS — M542 Cervicalgia: Secondary | ICD-10-CM | POA: Diagnosis not present

## 2022-01-01 DIAGNOSIS — S29012D Strain of muscle and tendon of back wall of thorax, subsequent encounter: Secondary | ICD-10-CM | POA: Diagnosis not present

## 2022-01-08 ENCOUNTER — Telehealth: Payer: Self-pay

## 2022-01-08 NOTE — Telephone Encounter (Signed)
PA started for Ozempic 0.25mg   through [employer]. Determination is that patient does not require a PA for Ozempic.  ?Called and informed patient. Patient verbalized understanding ? ?

## 2022-01-10 DIAGNOSIS — M5416 Radiculopathy, lumbar region: Secondary | ICD-10-CM | POA: Diagnosis not present

## 2022-01-16 ENCOUNTER — Ambulatory Visit: Payer: BC Managed Care – PPO | Admitting: Internal Medicine

## 2022-01-28 ENCOUNTER — Ambulatory Visit (INDEPENDENT_AMBULATORY_CARE_PROVIDER_SITE_OTHER): Payer: BC Managed Care – PPO | Admitting: Internal Medicine

## 2022-01-28 ENCOUNTER — Encounter: Payer: Self-pay | Admitting: Internal Medicine

## 2022-01-28 VITALS — BP 122/81 | HR 84 | Temp 98.3°F | Ht 62.99 in | Wt 188.6 lb

## 2022-01-28 DIAGNOSIS — R21 Rash and other nonspecific skin eruption: Secondary | ICD-10-CM | POA: Insufficient documentation

## 2022-01-28 DIAGNOSIS — M5416 Radiculopathy, lumbar region: Secondary | ICD-10-CM | POA: Diagnosis not present

## 2022-01-28 HISTORY — DX: Rash and other nonspecific skin eruption: R21

## 2022-01-28 LAB — CBC WITH DIFFERENTIAL/PLATELET
Hematocrit: 38.1 % (ref 34.0–46.6)
Hemoglobin: 13.9 g/dL (ref 11.1–15.9)
Lymphocytes Absolute: 1.9 10*3/uL (ref 0.7–3.1)
Lymphs: 33 %
MCH: 27.4 pg (ref 26.6–33.0)
MCHC: 36.5 g/dL — ABNORMAL HIGH (ref 31.5–35.7)
MCV: 75 fL — ABNORMAL LOW (ref 79–97)
MID (Absolute): 0.4 10*3/uL (ref 0.1–1.6)
MID: 6 %
Neutrophils Absolute: 3.5 10*3/uL (ref 1.4–7.0)
Neutrophils: 61 %
Platelets: 359 10*3/uL (ref 150–450)
RBC: 5.07 x10E6/uL (ref 3.77–5.28)
RDW: 13.1 % (ref 11.7–15.4)
WBC: 5.8 10*3/uL (ref 3.4–10.8)

## 2022-01-28 MED ORDER — CLOTRIMAZOLE-BETAMETHASONE 1-0.05 % EX CREA: 1.0000 "application " | TOPICAL_CREAM | Freq: Every day | CUTANEOUS | 1 refills | Status: DC

## 2022-01-28 NOTE — Progress Notes (Signed)
? ?BP 122/81   Pulse 84   Temp 98.3 ?F (36.8 ?C) (Oral)   Ht 5' 2.99" (1.6 m)   Wt 188 lb 9.6 oz (85.5 kg)   SpO2 98%   BMI 33.42 kg/m?   ? ?Subjective:  ? ? Patient ID: Cindy Carson, female    DOB: 1971-05-24, 51 y.o.   MRN: [id number] ? ?Chief Complaint  ?Patient presents with  ?? Rash  ?  Left lower forearm  ? ? ?HPI: ?Cindy Carson is a 50 y.o. female ? ?Rash ?This is a new (on the left forearm , has a lot of trees in her area , she says it was targetoid in thebginning) problem. The current episode started in the past 7 days. The rash is characterized by itchiness, dryness and redness. Pertinent negatives include no anorexia, congestion, cough, diarrhea, eye pain, facial edema, fatigue, fever, joint pain, nail changes, rhinorrhea, shortness of breath, sore throat or vomiting.  ? ?Chief Complaint  ?Patient presents with  ?? Rash  ?  Left lower forearm  ? ? ?Relevant past medical, surgical, family and social history reviewed and updated as indicated. Interim medical history since our last visit reviewed. ?Allergies and medications reviewed and updated. ? ?Review of Systems  ?Constitutional:  Negative for fatigue and fever.  ?HENT:  Negative for congestion, rhinorrhea and sore throat.   ?Eyes:  Negative for pain.  ?Respiratory:  Negative for cough and shortness of breath.   ?Gastrointestinal:  Negative for anorexia, diarrhea and vomiting.  ?Musculoskeletal:  Negative for joint pain.  ?Skin:  Positive for rash. Negative for nail changes.  ? ?Per HPI unless specifically indicated above ? ?   ?Objective:  ?  ?BP 122/81   Pulse 84   Temp 98.3 ?F (36.8 ?C) (Oral)   Ht 5' 2.99" (1.6 m)   Wt 188 lb 9.6 oz (85.5 kg)   SpO2 98%   BMI 33.42 kg/m?   ?Wt Readings from Last 3 Encounters:  ?01/28/22 188 lb 9.6 oz (85.5 kg)  ?09/03/21 190 lb (86.2 kg)  ?08/01/21 196 lb 3.2 oz (89 kg)  ?  ?Physical Exam ?Vitals and nursing note reviewed.  ?Constitutional:   ?   General: She is not in acute distress. ?   Appearance: Normal  appearance. She is not diaphoretic.  ?Pulmonary:  ?   Breath sounds: No rhonchi.  ?Skin: ?   General: Skin is warm and dry.  ?   Coloration: Skin is not jaundiced.  ?   Findings: Lesion and rash present. No erythema.  ?   Comments: Circular lesion on the left forearm.   ?Neurological:  ?   Mental Status: She is alert.  ? ?Results for orders placed or performed during the hospital encounter of 09/03/21  ?Glucose, capillary  ?Result Value Ref Range  ? Glucose-Capillary 146 (H) 70 - 99 mg/dL  ? ?   ? ? ?Current Outpatient Medications:  ??  clotrimazole-betamethasone (LOTRISONE) cream, Apply 1 application. topically daily., Disp: 30 g, Rfl: 1 ??  amLODipine (NORVASC) 5 MG tablet, Take 1 tablet (5 mg total) by mouth daily., Disp: 30 tablet, Rfl: 0 ??  atorvastatin (LIPITOR) 20 MG tablet, Take 1 tablet (20 mg total) by mouth daily., Disp: 30 tablet, Rfl: 4 ??  Blood Glucose Monitoring Suppl (ONETOUCH VERIO IQ SYSTEM) w/Device KIT, Test twice a day, one touch verio flex meter, Disp: 1 kit, Rfl: 0 ??  DULoxetine (CYMBALTA) 60 MG capsule, Take 1 capsule (60 mg total) by mouth daily.,  Disp: 30 capsule, Rfl: 3 ??  fluticasone (FLONASE) 50 MCG/ACT nasal spray, Place 2 sprays into both nostrils daily., Disp: , Rfl:  ??  losartan (COZAAR) 25 MG tablet, Take 25 mg by mouth daily., Disp: , Rfl:  ??  Semaglutide,0.25 or 0.5MG/DOS, (OZEMPIC, 0.25 OR 0.5 MG/DOSE,) 2 MG/1.5ML SOPN, Inject 0.25 mg into the skin., Disp: , Rfl:   ? ? ?Assessment & Plan:  ?Rash  ?Will check for lymes / tick borne illness : ?Problem List Items Addressed This Visit   ? ?  ? Musculoskeletal and Integument  ? Rash - Primary  ? Relevant Orders  ? Rocky mtn spotted fvr abs pnl(IgG+IgM)  ? Ehrlichia antibody panel  ? Babesia microti Antibody Panel  ? Comprehensive metabolic panel  ? CBC with Differential/Platelet  ? CBC With Differential/Platelet  ? Lyme Disease Serology w/Reflex  ?  ? ?Orders Placed This Encounter  ?Procedures  ?? Rocky mtn spotted fvr abs  pnl(IgG+IgM)  ?? Ehrlichia antibody panel  ?? Babesia microti Antibody Panel  ?? Comprehensive metabolic panel  ?? CBC with Differential/Platelet  ?? CBC With Differential/Platelet  ?? Lyme Disease Serology w/Reflex  ?  ? ?Meds ordered this encounter  ?Medications  ?? clotrimazole-betamethasone (LOTRISONE) cream  ?  Sig: Apply 1 application. topically daily.  ?  Dispense:  30 g  ?  Refill:  1  ?  ? ?Follow up plan: ?No follow-ups on file. ? ? ?

## 2022-01-30 LAB — EHRLICHIA ANTIBODY PANEL
E. Chaffeensis (HME) IgM Titer: NEGATIVE
E.Chaffeensis (HME) IgG: NEGATIVE
HGE IgG Titer: NEGATIVE
HGE IgM Titer: NEGATIVE

## 2022-01-30 LAB — CBC WITH DIFFERENTIAL/PLATELET
Basophils Absolute: 0.1 10*3/uL (ref 0.0–0.2)
Basos: 1 %
EOS (ABSOLUTE): 0.1 10*3/uL (ref 0.0–0.4)
Eos: 2 %
Hematocrit: 41.3 % (ref 34.0–46.6)
Hemoglobin: 13.9 g/dL (ref 11.1–15.9)
Immature Grans (Abs): 0 10*3/uL (ref 0.0–0.1)
Immature Granulocytes: 0 %
Lymphocytes Absolute: 1.8 10*3/uL (ref 0.7–3.1)
Lymphs: 31 %
MCH: 27.2 pg (ref 26.6–33.0)
MCHC: 33.7 g/dL (ref 31.5–35.7)
MCV: 81 fL (ref 79–97)
Monocytes Absolute: 0.3 10*3/uL (ref 0.1–0.9)
Monocytes: 6 %
Neutrophils Absolute: 3.5 10*3/uL (ref 1.4–7.0)
Neutrophils: 60 %
Platelets: 376 10*3/uL (ref 150–450)
RBC: 5.11 x10E6/uL (ref 3.77–5.28)
RDW: 13.1 % (ref 11.7–15.4)
WBC: 5.8 10*3/uL (ref 3.4–10.8)

## 2022-01-30 LAB — COMPREHENSIVE METABOLIC PANEL
ALT: 25 IU/L (ref 0–32)
AST: 15 IU/L (ref 0–40)
Albumin/Globulin Ratio: 1.4 (ref 1.2–2.2)
Albumin: 4.3 g/dL (ref 3.8–4.9)
Alkaline Phosphatase: 136 IU/L — ABNORMAL HIGH (ref 44–121)
BUN/Creatinine Ratio: 11 (ref 9–23)
BUN: 10 mg/dL (ref 6–24)
Bilirubin Total: 0.4 mg/dL (ref 0.0–1.2)
CO2: 24 mmol/L (ref 20–29)
Calcium: 9.8 mg/dL (ref 8.7–10.2)
Chloride: 99 mmol/L (ref 96–106)
Creatinine, Ser: 0.87 mg/dL (ref 0.57–1.00)
Globulin, Total: 3.1 g/dL (ref 1.5–4.5)
Glucose: 376 mg/dL — ABNORMAL HIGH (ref 70–99)
Potassium: 4.5 mmol/L (ref 3.5–5.2)
Sodium: 135 mmol/L (ref 134–144)
Total Protein: 7.4 g/dL (ref 6.0–8.5)
eGFR: 81 mL/min/{1.73_m2} (ref >59)

## 2022-01-30 LAB — BABESIA MICROTI ANTIBODY PANEL
Babesia microti IgG: <1:10 {titer}
Babesia microti IgM: <1:10 {titer}

## 2022-01-30 LAB — ROCKY MTN SPOTTED FVR ABS PNL(IGG+IGM)
RMSF IgG: NEGATIVE
RMSF IgM: 0.99 index — ABNORMAL HIGH (ref 0.00–0.89)

## 2022-01-30 LAB — LYME DISEASE SEROLOGY W/REFLEX: Lyme Total Antibody EIA: NEGATIVE

## 2022-01-30 MED ORDER — DOXYCYCLINE HYCLATE 100 MG PO TABS: 100.0000 mg | ORAL_TABLET | Freq: Two times a day (BID) | ORAL | 0 refills | Status: AC

## 2022-01-30 NOTE — Addendum Note (Signed)
Addended byLoura Pardon on: 01/30/2022 09:29 AM ? ? Modules accepted: Orders ? ?

## 2022-01-30 NOTE — Progress Notes (Signed)
Pt is positive for RMSF IgM abx  ?Will start pt on doxycycline 100 mg bid  14 days. Pt informed of results .  ?Needs to be reported to health department please.  ?Thnx.

## 2022-02-04 ENCOUNTER — Other Ambulatory Visit: Payer: Self-pay | Admitting: Internal Medicine

## 2022-02-04 DIAGNOSIS — E1159 Type 2 diabetes mellitus with other circulatory complications: Secondary | ICD-10-CM

## 2022-02-04 NOTE — Telephone Encounter (Unsigned)
Copied from CRM [id number]. Topic: General - Other ?>> Feb 04, 2022  3:05 PM Gaetana Michaelis A wrote: ?Reason for CRM: Medication Refill - Medication: amLODipine (NORVASC) 5 MG tablet [[id number]]  ? ?Has the patient contacted their pharmacy? Yes.   ?(Agent: If no, request that the patient contact the pharmacy for the refill. If patient does not wish to contact the pharmacy document the reason why and proceed with request.) ?(Agent: If yes, when and what did the pharmacy advise?) ? ?Preferred Pharmacy (with phone number or street name): [facility] [address] [place] - [address] ?[address] [place] [address] ?Phone: [phone] Fax: [phone] ?Hours: Not open 24 hours ? ?Has the patient been seen for an appointment in the last year OR does the patient have an upcoming appointment? Yes.   ? ?Agent: Please be advised that RX refills may take up to 3 business days. We ask that you follow-up with your pharmacy. ?

## 2022-02-05 MED ORDER — AMLODIPINE BESYLATE 5 MG PO TABS: 5.0000 mg | ORAL_TABLET | Freq: Every day | ORAL | 0 refills | Status: DC

## 2022-02-05 NOTE — Telephone Encounter (Signed)
Requested medication (s) are due for refill today: Yes ? ?Requested medication (s) are on the active medication list: yes   ? ?Last refill: 08/23/2019  #30 0 refills ? ?Future visit scheduled no ? ?Notes to clinic:Historical Provider ? ?Requested Prescriptions  ?Pending Prescriptions Disp Refills  ? amLODipine (NORVASC) 5 MG tablet 30 tablet 0  ?  Sig: Take 1 tablet (5 mg total) by mouth daily.  ?  ? Cardiovascular: Calcium Channel Blockers 2 Passed - 02/04/2022  5:23 PM  ?  ?  Passed - Last BP in normal range  ?  BP Readings from Last 1 Encounters:  ?01/28/22 122/81  ?   ?  ?  Passed - Last Heart Rate in normal range  ?  Pulse Readings from Last 1 Encounters:  ?01/28/22 84  ?   ?  ?  Passed - Valid encounter within last 6 months  ?  Recent Outpatient Visits   ? ?      ? 1 week ago Rash  ? [facility] Vigg, Avanti, MD  ? 4 months ago Neck pain  ? [facility] Vigg, Avanti, MD  ? 6 months ago Screening for colon cancer  ? [facility] Vigg, Avanti, MD  ? 6 months ago Encounter for screening for malignant neoplasm of breast, unspecified screening modality  ? [facility] Vigg, Avanti, MD  ? ?  ?  ? ? ?  ?  ?  ? ? ? ? ?

## 2022-02-14 DIAGNOSIS — S29012D Strain of muscle and tendon of back wall of thorax, subsequent encounter: Secondary | ICD-10-CM | POA: Diagnosis not present

## 2022-02-14 DIAGNOSIS — M5416 Radiculopathy, lumbar region: Secondary | ICD-10-CM | POA: Diagnosis not present

## 2022-02-14 DIAGNOSIS — M542 Cervicalgia: Secondary | ICD-10-CM | POA: Diagnosis not present

## 2022-02-21 DIAGNOSIS — I10 Essential (primary) hypertension: Secondary | ICD-10-CM | POA: Diagnosis not present

## 2022-02-21 DIAGNOSIS — G4733 Obstructive sleep apnea (adult) (pediatric): Secondary | ICD-10-CM | POA: Diagnosis not present

## 2022-02-26 ENCOUNTER — Other Ambulatory Visit: Payer: Self-pay

## 2022-02-26 DIAGNOSIS — Z1231 Encounter for screening mammogram for malignant neoplasm of breast: Secondary | ICD-10-CM

## 2022-02-26 MED ORDER — OZEMPIC (0.25 OR 0.5 MG/DOSE) 2 MG/1.5ML ~~LOC~~ SOPN
0.2500 mg | PEN_INJECTOR | SUBCUTANEOUS | 4 refills | Status: DC
Start: 2022-02-26 — End: 2022-03-19

## 2022-02-26 NOTE — Telephone Encounter (Signed)
Last ov 01/28/22  Up coming visit none

## 2022-02-27 ENCOUNTER — Other Ambulatory Visit: Payer: Self-pay | Admitting: Internal Medicine

## 2022-02-27 NOTE — Telephone Encounter (Signed)
Sending to different [facility] location Requested Prescriptions  Pending Prescriptions Disp Refills  . OZEMPIC, 0.25 OR 0.5 MG/DOSE, 2 MG/3ML SOPN [Pharmacy Med Name: Ozempic (0.25 or 0.5 MG/DOSE) 2 MG/3ML Subcutaneous Solution Pen-injector] 1.5 mL 4    Sig: INJECT 0.25MG  SUBCUTANEOUSLY ONCE WEEKLY     There is no refill protocol information for this order

## 2022-03-04 ENCOUNTER — Other Ambulatory Visit: Payer: Self-pay | Admitting: Internal Medicine

## 2022-03-04 DIAGNOSIS — E1159 Type 2 diabetes mellitus with other circulatory complications: Secondary | ICD-10-CM

## 2022-03-04 DIAGNOSIS — E785 Hyperlipidemia, unspecified: Secondary | ICD-10-CM

## 2022-03-04 NOTE — Telephone Encounter (Signed)
Requested Prescriptions  Pending Prescriptions Disp Refills  . atorvastatin (LIPITOR) 20 MG tablet [Pharmacy Med Name: Atorvastatin Calcium 20 MG Oral Tablet] 90 tablet 0    Sig: Take 1 tablet by mouth once daily     Cardiovascular:  Antilipid - Statins Failed - 03/04/2022 11:03 AM      Failed - Lipid Panel in normal range within the last 12 months    Cholesterol, Total  Date Value Ref Range Status  07/16/2021 193 100 - 199 mg/dL Final   LDL Chol Calc (NIH)  Date Value Ref Range Status  07/16/2021 114 (H) 0 - 99 mg/dL Final   HDL  Date Value Ref Range Status  07/16/2021 39 (L) >39 mg/dL Final   Triglycerides  Date Value Ref Range Status  07/16/2021 229 (H) 0 - 149 mg/dL Final         Passed - Patient is not pregnant      Passed - Valid encounter within last 12 months    Recent Outpatient Visits          1 month ago [place] Vigg, Avanti, MD   5 months ago Neck pain   [facility] Vigg, Avanti, MD   7 months ago Screening for colon cancer   [place] Vigg, Avanti, MD   7 months ago Encounter for screening for malignant neoplasm of breast, unspecified screening modality   [facility] Vigg, Avanti, MD             . DULoxetine (CYMBALTA) 60 MG capsule [Pharmacy Med Name: DULoxetine HCl 60 MG Oral Capsule Delayed Release Particles] 90 capsule 0    Sig: Take 1 capsule by mouth once daily     Psychiatry: Antidepressants - SNRI - duloxetine Passed - 03/04/2022 11:03 AM      Passed - Cr in normal range and within 360 days    Creatinine, Ser  Date Value Ref Range Status  01/28/2022 0.87 0.57 - 1.00 mg/dL Final         Passed - eGFR is 30 or above and within 360 days    GFR calc Af Amer  Date Value Ref Range Status  03/30/2019 92 >59 mL/min/1.73 Final   GFR calc non Af Amer  Date Value Ref Range Status  03/30/2019 80 >59 mL/min/1.73 Final   eGFR  Date Value Ref Range Status  01/28/2022 81 >59  mL/min/1.73 Final         Passed - Completed PHQ-2 or PHQ-9 in the last 360 days      Passed - Last BP in normal range    BP Readings from Last 1 Encounters:  01/28/22 122/81         Passed - Valid encounter within last 6 months    Recent Outpatient Visits          1 month ago [place] Vigg, Avanti, MD   5 months ago Neck pain   [place], Avanti, MD   7 months ago Screening for colon cancer   [place] Vigg, Avanti, MD   7 months ago Encounter for screening for malignant neoplasm of breast, unspecified screening modality   [facility] Vigg, Avanti, MD             . amLODipine (NORVASC) 5 MG tablet [Pharmacy Med Name: amLODIPine Besylate 5 MG Oral Tablet] 90 tablet 0    Sig: Take 1 tablet by mouth once  daily     Cardiovascular: Calcium Channel Blockers 2 Passed - 03/04/2022 11:03 AM      Passed - Last BP in normal range    BP Readings from Last 1 Encounters:  01/28/22 122/81         Passed - Last Heart Rate in normal range    Pulse Readings from Last 1 Encounters:  01/28/22 84         Passed - Valid encounter within last 6 months    Recent Outpatient Visits          1 month ago [place] Vigg, Avanti, MD   5 months ago Neck pain   [place], MD   7 months ago Screening for colon cancer   [place] Vigg, Avanti, MD   7 months ago Encounter for screening for malignant neoplasm of breast, unspecified screening modality   [facility] Vigg, [place]oman [place], MD

## 2022-03-19 ENCOUNTER — Encounter: Payer: Self-pay | Admitting: Internal Medicine

## 2022-03-19 ENCOUNTER — Ambulatory Visit: Payer: BC Managed Care – PPO | Admitting: Internal Medicine

## 2022-03-19 VITALS — BP 128/85 | HR 88 | Temp 98.9°F | Ht 69.02 in | Wt 186.2 lb

## 2022-03-19 DIAGNOSIS — Z6834 Body mass index (BMI) 34.0-34.9, adult: Secondary | ICD-10-CM

## 2022-03-19 DIAGNOSIS — I10 Essential (primary) hypertension: Secondary | ICD-10-CM | POA: Diagnosis not present

## 2022-03-19 DIAGNOSIS — E669 Obesity, unspecified: Secondary | ICD-10-CM | POA: Diagnosis not present

## 2022-03-19 DIAGNOSIS — E1169 Type 2 diabetes mellitus with other specified complication: Secondary | ICD-10-CM

## 2022-03-19 DIAGNOSIS — E785 Hyperlipidemia, unspecified: Secondary | ICD-10-CM

## 2022-03-19 DIAGNOSIS — E119 Type 2 diabetes mellitus without complications: Secondary | ICD-10-CM

## 2022-03-19 LAB — URINALYSIS, ROUTINE W REFLEX MICROSCOPIC
Bilirubin, UA: NEGATIVE
Leukocytes,UA: NEGATIVE
Nitrite, UA: NEGATIVE
RBC, UA: NEGATIVE
Specific Gravity, UA: >1.03 — ABNORMAL HIGH (ref 1.005–1.030)
Urobilinogen, Ur: 1 mg/dL (ref 0.2–1.0)
pH, UA: 5.5 (ref 5.0–7.5)

## 2022-03-19 LAB — MICROSCOPIC EXAMINATION
Bacteria, UA: NONE SEEN
RBC, Urine: NONE SEEN /hpf (ref 0–2)
WBC, UA: NONE SEEN /hpf (ref 0–5)

## 2022-03-19 LAB — BAYER DCA HB A1C WAIVED: HB A1C (BAYER DCA - WAIVED): 11.1 % — ABNORMAL HIGH (ref 4.8–5.6)

## 2022-03-19 MED ORDER — SEMAGLUTIDE (1 MG/DOSE) 4 MG/3ML ~~LOC~~ SOPN: 1.0000 mg | PEN_INJECTOR | SUBCUTANEOUS | 4 refills | Status: DC

## 2022-03-19 NOTE — Progress Notes (Unsigned)
BP 128/85   Pulse 88   Temp 98.9 F (37.2 C) (Oral)   Ht 5' 9.02" (1.753 m)   Wt 186 lb 3.2 oz (84.5 kg)   SpO2 98%   BMI 27.48 kg/m    Subjective:    Patient ID: Cindy Carson, female    DOB: 31-Jan-1971, 51 y.o.   MRN: [id number]  Chief Complaint  Patient presents with   Diabetes    HPI: Cindy Carson is a 51 y.o. female  Diabetes She presents for her follow-up (fsbs - 100 -- 200's) diabetic visit. She has type 2 diabetes mellitus. Her disease course has been stable. Pertinent negatives for diabetes include no blurred vision, no chest pain, no fatigue, no foot paresthesias, no foot ulcerations, no polydipsia, no polyphagia, no polyuria, no visual change, no weakness and no weight loss.  Hyperlipidemia This is a chronic problem. She has no history of diabetes, hypothyroidism, liver disease, obesity or nephrotic syndrome. Pertinent negatives include no chest pain.    Chief Complaint  Patient presents with   Diabetes    Relevant past medical, surgical, family and social history reviewed and updated as indicated. Interim medical history since our last visit reviewed. Allergies and medications reviewed and updated.  Review of Systems  Constitutional:  Negative for fatigue and weight loss.  Eyes:  Negative for blurred vision.  Cardiovascular:  Negative for chest pain.  Endocrine: Negative for polydipsia, polyphagia and polyuria.  Neurological:  Negative for weakness.    Per HPI unless specifically indicated above     Objective:    BP 128/85   Pulse 88   Temp 98.9 F (37.2 C) (Oral)   Ht 5' 9.02" (1.753 m)   Wt 186 lb 3.2 oz (84.5 kg)   SpO2 98%   BMI 27.48 kg/m   Wt Readings from Last 3 Encounters:  03/19/22 186 lb 3.2 oz (84.5 kg)  01/28/22 188 lb 9.6 oz (85.5 kg)  09/03/21 190 lb (86.2 kg)    Physical Exam  Results for orders placed or performed in visit on 02/25/22  HM PAP SMEAR  Result Value Ref Range   HM Pap smear see result scanned into chart          Current Outpatient Medications:    amLODipine (NORVASC) 5 MG tablet, Take 1 tablet by mouth once daily, Disp: 90 tablet, Rfl: 0   atorvastatin (LIPITOR) 20 MG tablet, Take 1 tablet by mouth once daily, Disp: 90 tablet, Rfl: 0   Blood Glucose Monitoring Suppl (ONETOUCH VERIO IQ SYSTEM) w/Device KIT, Test twice a day, one touch verio flex meter, Disp: 1 kit, Rfl: 0   clotrimazole-betamethasone (LOTRISONE) cream, Apply 1 application. topically daily., Disp: 30 g, Rfl: 1   DULoxetine (CYMBALTA) 60 MG capsule, Take 1 capsule by mouth once daily, Disp: 90 capsule, Rfl: 0   losartan (COZAAR) 25 MG tablet, Take 25 mg by mouth daily., Disp: , Rfl:    OZEMPIC, 0.25 OR 0.5 MG/DOSE, 2 MG/3ML SOPN, INJECT 0.25MG SUBCUTANEOUSLY ONCE WEEKLY, Disp: 1.5 mL, Rfl: 4   Semaglutide,0.25 or 0.5MG/DOS, (OZEMPIC, 0.25 OR 0.5 MG/DOSE,) 2 MG/1.5ML SOPN, Inject 0.25 mg into the skin once a week., Disp: 1.5 mL, Rfl: 4   fluticasone (FLONASE) 50 MCG/ACT nasal spray, Place 2 sprays into both nostrils daily. (Patient not taking: Reported on 03/19/2022), Disp: , Rfl:     Assessment & Plan:  1.DM : Is only on ozempic. check HbA1c,  urine  microalbumin  diabetic diet plan given to pt  adviced regarding hypoglycemia and instructions given to pt today on how to prevent and treat the same if it were to occur. pt acknowledges the plan and voices understanding of the same.  exercise plan given and encouraged.   advice diabetic yearly podiatry, ophthalmology , nutritionist , dental check q 6 months,  2. HLD recheck FLP, check LFT's work on diet, SE of meds explained to pt. low fat and high fiber diet explained to pt.  3. HTN Continue current meds.  Medication compliance emphasised. pt advised to keep Bp logs. Pt verbalised understanding of the same. Pt to have a low salt diet . Exercise to reach a goal of at least 150 mins a week.  lifestyle modifications explained and pt understands importance of the above. Under good control on  current regimen. Continue current regimen. Continue to monitor. Call with any concerns. Refills given. Labs drawn today.   Problem List Items Addressed This Visit       Endocrine   Diabetes mellitus without complication ([place]) - Primary   Relevant Orders   Bayer DCA Hb A1c Waived (STAT)   Urinalysis, Routine w reflex microscopic     Orders Placed This Encounter  Procedures   Bayer DCA Hb A1c Waived (STAT)   Urinalysis, Routine w reflex microscopic     No orders of the defined types were placed in this encounter.    Follow up plan: No follow-ups on file.

## 2022-03-28 DIAGNOSIS — G4733 Obstructive sleep apnea (adult) (pediatric): Secondary | ICD-10-CM | POA: Diagnosis not present

## 2022-04-02 ENCOUNTER — Other Ambulatory Visit: Payer: Self-pay

## 2022-04-02 ENCOUNTER — Encounter (HOSPITAL_BASED_OUTPATIENT_CLINIC_OR_DEPARTMENT_OTHER): Payer: Self-pay

## 2022-04-02 ENCOUNTER — Emergency Department (HOSPITAL_BASED_OUTPATIENT_CLINIC_OR_DEPARTMENT_OTHER): Payer: BC Managed Care – PPO | Admitting: Radiology

## 2022-04-02 ENCOUNTER — Emergency Department (HOSPITAL_BASED_OUTPATIENT_CLINIC_OR_DEPARTMENT_OTHER)
Admission: EM | Admit: 2022-04-02 | Discharge: 2022-04-02 | Disposition: A | Discharge status: Home / Self Care | Payer: BC Managed Care – PPO | Attending: Emergency Medicine | Admitting: Emergency Medicine

## 2022-04-02 DIAGNOSIS — W19XXXA Unspecified fall, initial encounter: Secondary | ICD-10-CM | POA: Diagnosis not present

## 2022-04-02 DIAGNOSIS — Z043 Encounter for examination and observation following other accident: Secondary | ICD-10-CM | POA: Diagnosis not present

## 2022-04-02 DIAGNOSIS — S80212A Abrasion, left knee, initial encounter: Secondary | ICD-10-CM | POA: Diagnosis not present

## 2022-04-02 DIAGNOSIS — Z794 Long term (current) use of insulin: Secondary | ICD-10-CM | POA: Insufficient documentation

## 2022-04-02 DIAGNOSIS — M25562 Pain in left knee: Secondary | ICD-10-CM | POA: Diagnosis not present

## 2022-04-02 DIAGNOSIS — I10 Essential (primary) hypertension: Secondary | ICD-10-CM | POA: Insufficient documentation

## 2022-04-02 DIAGNOSIS — E119 Type 2 diabetes mellitus without complications: Secondary | ICD-10-CM | POA: Insufficient documentation

## 2022-04-02 DIAGNOSIS — S39012A Strain of muscle, fascia and tendon of lower back, initial encounter: Secondary | ICD-10-CM | POA: Insufficient documentation

## 2022-04-02 DIAGNOSIS — S46912A Strain of unspecified muscle, fascia and tendon at shoulder and upper arm level, left arm, initial encounter: Secondary | ICD-10-CM | POA: Insufficient documentation

## 2022-04-02 DIAGNOSIS — Z79899 Other long term (current) drug therapy: Secondary | ICD-10-CM | POA: Diagnosis not present

## 2022-04-02 DIAGNOSIS — M19012 Primary osteoarthritis, left shoulder: Secondary | ICD-10-CM | POA: Diagnosis not present

## 2022-04-02 DIAGNOSIS — S3992XA Unspecified injury of lower back, initial encounter: Secondary | ICD-10-CM | POA: Diagnosis not present

## 2022-04-02 DIAGNOSIS — M25512 Pain in left shoulder: Secondary | ICD-10-CM | POA: Diagnosis not present

## 2022-04-02 DIAGNOSIS — S46012A Strain of muscle(s) and tendon(s) of the rotator cuff of left shoulder, initial encounter: Secondary | ICD-10-CM | POA: Diagnosis not present

## 2022-04-02 DIAGNOSIS — S8992XA Unspecified injury of left lower leg, initial encounter: Secondary | ICD-10-CM

## 2022-04-02 NOTE — ED Provider Notes (Signed)
[facility] EMERGENCY DEPT Provider Note   CSN: [id number] Arrival date & time: 04/02/22  4967     History  Chief Complaint  Patient presents with  . Fall    Smita Lesh is a 51 y.o. female.  Patient status post fall yesterday outside moving forward.  Patient with complaint of left knee pain left shoulder pain and pain to the lumbar part of the back.  No loss of consciousness patient not on blood thinners.  No chest pain abdominal pain or shortness of breath.  Patient with an abrasion to the left knee.  Patient is followed by [facility].  Past medical history significant for type 2 diabetes hypertension chronic low back pain chronic neck pain.  Patient is never smoked.      Home Medications Prior to Admission medications   Medication Sig Start Date End Date Taking? Authorizing Provider  amLODipine (NORVASC) 5 MG tablet Take 1 tablet by mouth once daily 03/04/22   Vigg, Avanti, MD  atorvastatin (LIPITOR) 20 MG tablet Take 1 tablet by mouth once daily 03/04/22   Vigg, Avanti, MD  Blood Glucose Monitoring Suppl (ONETOUCH VERIO IQ SYSTEM) w/Device KIT Test twice a day, one touch verio flex meter 12/16/18   Henson, Vickie L, NP-C  clotrimazole-betamethasone (LOTRISONE) cream Apply 1 application. topically daily. 01/28/22   Vigg, Avanti, MD  DULoxetine (CYMBALTA) 60 MG capsule Take 1 capsule by mouth once daily 03/04/22   Vigg, Avanti, MD  fluticasone (FLONASE) 50 MCG/ACT nasal spray Place 2 sprays into both nostrils daily. Patient not taking: Reported on 03/19/2022 06/19/21   [provider]  losartan (COZAAR) 25 MG tablet Take 25 mg by mouth daily.    [provider]  Semaglutide, 1 MG/DOSE, 4 MG/3ML SOPN Inject 1 mg as directed once a week. 03/19/22   Charlynne Cousins, MD      Allergies    Nsaids    Review of Systems   Review of Systems  Constitutional:  Negative for chills and fever.  HENT:  Negative for ear pain and sore throat.   Eyes:  Negative for pain  and visual disturbance.  Respiratory:  Negative for cough and shortness of breath.   Cardiovascular:  Negative for chest pain and palpitations.  Gastrointestinal:  Negative for abdominal pain and vomiting.  Genitourinary:  Negative for dysuria and hematuria.  Musculoskeletal:  Positive for back pain. Negative for arthralgias, neck pain and neck stiffness.  Skin:  Positive for wound. Negative for color change and rash.  Neurological:  Negative for seizures and syncope.  All other systems reviewed and are negative.   Physical Exam Updated Vital Signs BP (!) 137/97   Pulse 92   Temp 98.7 F (37.1 C)   Resp 17   Ht 1.575 m (_0 )   Wt 81.6 kg   SpO2 99%   BMI 32.92 kg/m  Physical Exam Vitals and nursing note reviewed.  Constitutional:      General: She is not in acute distress.    Appearance: Normal appearance. She is well-developed.  HENT:     Head: Normocephalic and atraumatic.  Eyes:     Extraocular Movements: Extraocular movements intact.     Conjunctiva/sclera: Conjunctivae normal.     Pupils: Pupils are equal, round, and reactive to light.  Cardiovascular:     Rate and Rhythm: Normal rate and regular rhythm.     Heart sounds: No murmur heard. Pulmonary:     Effort: Pulmonary effort is normal. No respiratory distress.  Breath sounds: Normal breath sounds.  Abdominal:     Palpations: Abdomen is soft.     Tenderness: There is no abdominal tenderness.  Musculoskeletal:        General: Tenderness present. No swelling or deformity.     Cervical back: Normal range of motion and neck supple.     Comments: Left knee with abrasion no effusion patella not dislocated.  Dorsalis pedis pulse distally 2+.  With range of motion of the left shoulder no obvious deformity or dislocation.  Radial pulse 2+.  Sensation to the hand intact.  Good movement of the fingers good movement at the wrist good movement of the elbow.  Mild tenderness to palpation to the lumbar spine.  Patient also  with some tenderness to palpation to the proximal humerus.  Skin:    General: Skin is warm and dry.     Capillary Refill: Capillary refill takes less than 2 seconds.  Neurological:     General: No focal deficit present.     Mental Status: She is alert and oriented to person, place, and time.     Cranial Nerves: No cranial nerve deficit.     Sensory: No sensory deficit.     Motor: No weakness.  Psychiatric:        Mood and Affect: Mood normal.    ED Results / Procedures / Treatments   Labs (all labs ordered are listed, but only abnormal results are displayed) Labs Reviewed - No data to display  EKG None  Radiology DG Lumbar Spine Complete  Result Date: 04/02/2022 CLINICAL DATA:  Fall EXAM: LUMBAR SPINE - COMPLETE 4+ VIEW COMPARISON:  None Available. FINDINGS: Five lumbar type vertebral segments. Vertebral body heights and alignment are maintained. No fracture identified. Intervertebral disc spaces are relatively preserved. Minimal degenerative endplate changes. Mild lower lumbar facet arthrosis. IMPRESSION: No acute fracture or static listhesis of the lumbar spine. Electronically Signed   By: Duanne Guess D.O.   On: 04/02/2022 10:25   DG Knee Complete 4 Views Left  Result Date: 04/02/2022 CLINICAL DATA:  Fall, pain EXAM: LEFT KNEE - COMPLETE 4+ VIEW COMPARISON:  None Available. FINDINGS: Bidirectional patellar enthesophytes. The enthesophyte along the inferior margin of the patella is fragmented, age indeterminate. Otherwise, no evidence of acute fracture. No dislocation. No joint effusion. No evidence of arthropathy or other focal bone abnormality. Soft tissues are unremarkable. IMPRESSION: 1. Bidirectional patellar enthesophytes. The enthesophyte along the inferior margin of the patella is fragmented, age indeterminate. Correlate for point tenderness. 2. Otherwise, no acute osseous abnormality of the left knee. Electronically Signed   By: Duanne Guess D.O.   On: 04/02/2022 10:21    DG Shoulder Left  Result Date: 04/02/2022 CLINICAL DATA:  Fall EXAM: LEFT HUMERUS - 2 VIEW; LEFT SHOULDER - 3 VIEW COMPARISON:  None Available. FINDINGS: No evidence of fracture or dislocation. Mild degenerative changes of the acromioclavicular joint. Soft tissues are unremarkable. IMPRESSION: No acute osseous abnormality of the left shoulder or humerus. Electronically Signed   By: Allegra Lai M.D.   On: 04/02/2022 10:19   DG Humerus Left  Result Date: 04/02/2022 CLINICAL DATA:  Fall EXAM: LEFT HUMERUS - 2 VIEW; LEFT SHOULDER - 3 VIEW COMPARISON:  None Available. FINDINGS: No evidence of fracture or dislocation. Mild degenerative changes of the acromioclavicular joint. Soft tissues are unremarkable. IMPRESSION: No acute osseous abnormality of the left shoulder or humerus. Electronically Signed   By: Allegra Lai M.D.   On: 04/02/2022 10:19  Procedures Procedures    Medications Ordered in ED Medications - No data to display  ED Course/ Medical Decision Making/ A&P                           Medical Decision Making Amount and/or Complexity of Data Reviewed Radiology: ordered.   Status post fall yesterday.  Abrasion to the knee is healing well.  We will get x-rays of the left knee left shoulder and left humerus because of proximal tenderness to the humerus as well.  And we will get x-ray of the lumbar spine.  If all negative will treat symptomatically and have her follow-up with [facility].  X-rays lumbar spine no acute injuries.  X-ray left shoulder and humerus without any acute injuries.  But could have rotator cuff type injury.  X-ray of the knee shows some cortical changes to the inferior aspect of the patella but no concerns for fracture to the patella.  This can be followed up by orthopedics.  We will treat patient with left arm sling and have her follow-up with [facility].   Final Clinical Impression(s) / ED Diagnoses Final diagnoses:  Fall, initial encounter   Shoulder strain, left, initial encounter  Knee injury, left, initial encounter  Lumbar strain, initial encounter    Rx / DC Orders ED Discharge Orders     None         Fredia Sorrow, MD 04/04/22 1558

## 2022-04-02 NOTE — ED Triage Notes (Signed)
PT states she fell yesterday while outside, she was swatting bees away and accidentally fell down. Pt c/o pain to left arm, left knee, and lower back. Denies loc, no blood thinners

## 2022-04-02 NOTE — Discharge Instructions (Signed)
Follow-up with [facility] for the left knee and the left shoulder area.  The shoulder immobilizer as directed.  Can take it off to shower.  Recommend over-the-counter Aleve for the pain.  Work note provided.  As we discussed x-rays of the lumbar back without any bony abnormalities x-ray of the left shoulder without any bony abnormalities.  The left knee Has some rough edges inferiorly which could be old.

## 2022-04-07 ENCOUNTER — Telehealth: Payer: Self-pay | Admitting: *Deleted

## 2022-04-07 DIAGNOSIS — S46912A Strain of unspecified muscle, fascia and tendon at shoulder and upper arm level, left arm, initial encounter: Secondary | ICD-10-CM | POA: Diagnosis not present

## 2022-04-07 NOTE — Telephone Encounter (Signed)
Transition Care Management Follow-up Telephone Call Date of discharge and from where: [facility] 04-02-2022 How have you been since you were released from the hospital? Feeling a little better Any questions or concerns? No  Items Reviewed: Did the pt receive and understand the discharge instructions provided? Yes  Medications obtained and verified?  Other?  Any new allergies since your discharge? No  Dietary orders reviewed? No Do you have support at home? Yes   Home Care and Equipment/Supplies: Were home health services ordered?  If so, what is the name of the agency?   Has the agency set up a time to come to the patient's home?  Were any new equipment or medical supplies ordered?   What is the name of the medical supply agency?  Were you able to get the supplies/equipment?  Do you have any questions related to the use of the equipment or supplies?   Functional Questionnaire: (I = Independent and D = Dependent) ADLs:  HUSBAND HELPS   Bathing/Dressing- I/D  Meal Prep- I/D  Eating- I  Maintaining continence- I  Transferring/Ambulation- I  Managing Meds- I  Follow up appointments reviewed:  PCP Hospital f/u appt confirmed? No   Specialist Hospital f/u appt confirmed? Yes  Scheduled to see [facility] 04-07-2022  Are transportation arrangements needed? No   If their condition worsens, is the pt aware to call PCP or go to the Emergency Dept.? Yes Was the patient provided with contact information for the PCP's office or ED? Yes Was to pt encouraged to call back with questions or concerns? Yes

## 2022-04-09 DIAGNOSIS — S46912D Strain of unspecified muscle, fascia and tendon at shoulder and upper arm level, left arm, subsequent encounter: Secondary | ICD-10-CM | POA: Diagnosis not present

## 2022-04-09 DIAGNOSIS — M25512 Pain in left shoulder: Secondary | ICD-10-CM | POA: Diagnosis not present

## 2022-04-16 DIAGNOSIS — M25512 Pain in left shoulder: Secondary | ICD-10-CM | POA: Diagnosis not present

## 2022-04-16 DIAGNOSIS — S46912D Strain of unspecified muscle, fascia and tendon at shoulder and upper arm level, left arm, subsequent encounter: Secondary | ICD-10-CM | POA: Diagnosis not present

## 2022-04-18 DIAGNOSIS — M25512 Pain in left shoulder: Secondary | ICD-10-CM | POA: Diagnosis not present

## 2022-04-18 DIAGNOSIS — S46912D Strain of unspecified muscle, fascia and tendon at shoulder and upper arm level, left arm, subsequent encounter: Secondary | ICD-10-CM | POA: Diagnosis not present

## 2022-04-22 DIAGNOSIS — S46912D Strain of unspecified muscle, fascia and tendon at shoulder and upper arm level, left arm, subsequent encounter: Secondary | ICD-10-CM | POA: Diagnosis not present

## 2022-04-22 DIAGNOSIS — M25512 Pain in left shoulder: Secondary | ICD-10-CM | POA: Diagnosis not present

## 2022-04-24 DIAGNOSIS — M25512 Pain in left shoulder: Secondary | ICD-10-CM | POA: Diagnosis not present

## 2022-04-24 DIAGNOSIS — S46912D Strain of unspecified muscle, fascia and tendon at shoulder and upper arm level, left arm, subsequent encounter: Secondary | ICD-10-CM | POA: Diagnosis not present

## 2022-04-28 ENCOUNTER — Telehealth: Payer: Self-pay

## 2022-04-28 NOTE — Telephone Encounter (Signed)
PA started for Ozempic through [employer] med[employer]. Awaiting on determination  

## 2022-04-29 DIAGNOSIS — M25512 Pain in left shoulder: Secondary | ICD-10-CM | POA: Diagnosis not present

## 2022-04-29 DIAGNOSIS — S46912D Strain of unspecified muscle, fascia and tendon at shoulder and upper arm level, left arm, subsequent encounter: Secondary | ICD-10-CM | POA: Diagnosis not present

## 2022-04-30 ENCOUNTER — Telehealth: Payer: Self-pay

## 2022-04-30 NOTE — Telephone Encounter (Signed)
PA for Ozempic has been approved from 04/29/22 through 04/29/25

## 2022-05-06 DIAGNOSIS — M25512 Pain in left shoulder: Secondary | ICD-10-CM | POA: Diagnosis not present

## 2022-05-06 DIAGNOSIS — S46912D Strain of unspecified muscle, fascia and tendon at shoulder and upper arm level, left arm, subsequent encounter: Secondary | ICD-10-CM | POA: Diagnosis not present

## 2022-05-08 DIAGNOSIS — S46912D Strain of unspecified muscle, fascia and tendon at shoulder and upper arm level, left arm, subsequent encounter: Secondary | ICD-10-CM | POA: Diagnosis not present

## 2022-05-08 DIAGNOSIS — M25512 Pain in left shoulder: Secondary | ICD-10-CM | POA: Diagnosis not present

## 2022-05-13 DIAGNOSIS — M25512 Pain in left shoulder: Secondary | ICD-10-CM | POA: Diagnosis not present

## 2022-05-29 ENCOUNTER — Other Ambulatory Visit: Payer: Self-pay

## 2022-05-29 NOTE — Telephone Encounter (Signed)
Hello Ms. Tammala, I am going through our reports for the office and see that you are overdue for your mammogram. I would be glad to order and or schedule the mammogram for you if you would like. We typically use [facility] for this. Would you be ok with this? If so please return our call with what days and times of the day that works best for you. Please let me know either way which you would like to do. I hope you have a great day?

## 2022-05-30 ENCOUNTER — Telehealth: Payer: Self-pay

## 2022-05-30 NOTE — Telephone Encounter (Addendum)
Patient wanted to inform Nurse with her updated insurance information. Patient would like Ozempic to be filed under new insurance  [employer]  Member ID # [id number]  Group #  [id number]  Rx Bin # [id number]   Patient has not received her insurance card and will take a picture and send through My Chart. Patient states she should receive her cards in the next 7 to 10 days.

## 2022-05-30 NOTE — Telephone Encounter (Signed)
Called patient to see if her insurance changed from her last visit and patient stated that she is in the middle of a insurance change. I asked that she send [place] a picture of her new card as soon as she receives it or when she know what her ID # is and a Rxbin number so I amy get this approval started with her new insurance. Patient verbalized understanding

## 2022-06-02 NOTE — Telephone Encounter (Signed)
New PA for Ozempic has been started with patients new insurance information. Awaiting on determination

## 2022-06-03 ENCOUNTER — Telehealth: Payer: Self-pay

## 2022-06-03 NOTE — Telephone Encounter (Signed)
PA for Ozempic has been approved effective from 06/02/2022 through 06/02/2023. Patient has been notifed via phone call.

## 2022-06-09 ENCOUNTER — Telehealth: Payer: Self-pay

## 2022-06-09 NOTE — Telephone Encounter (Signed)
PA for Ozempic 1mg /dose 4mg /17ml pen has been approved from 06/02/22 through 06/02/2023

## 2022-06-19 ENCOUNTER — Other Ambulatory Visit (HOSPITAL_BASED_OUTPATIENT_CLINIC_OR_DEPARTMENT_OTHER): Payer: Self-pay

## 2022-06-19 MED ORDER — INFLUENZA VAC SPLIT QUAD 0.5 ML IM SUSY
PREFILLED_SYRINGE | INTRAMUSCULAR | 0 refills | Status: DC
Start: 2022-06-19 — End: 2022-07-25
  Filled 2022-06-19: qty 0.5, 1d supply, fill #0

## 2022-07-09 ENCOUNTER — Other Ambulatory Visit (HOSPITAL_BASED_OUTPATIENT_CLINIC_OR_DEPARTMENT_OTHER): Payer: Self-pay

## 2022-07-09 MED ORDER — ZOSTER VAC RECOMB ADJUVANTED 50 MCG/0.5ML IM SUSR
INTRAMUSCULAR | 1 refills | Status: DC
  Filled 2022-07-09: qty 0.5, 1d supply, fill #0

## 2022-07-14 ENCOUNTER — Other Ambulatory Visit: Payer: Self-pay

## 2022-07-14 DIAGNOSIS — E785 Hyperlipidemia, unspecified: Secondary | ICD-10-CM

## 2022-07-14 NOTE — Telephone Encounter (Signed)
Medication refill for Atorvastatin 20 mg last ov 03/19/22, upcoming ov no future  . Please advise

## 2022-07-21 ENCOUNTER — Other Ambulatory Visit: Payer: Self-pay

## 2022-07-21 DIAGNOSIS — E785 Hyperlipidemia, unspecified: Secondary | ICD-10-CM

## 2022-07-21 DIAGNOSIS — E119 Type 2 diabetes mellitus without complications: Secondary | ICD-10-CM

## 2022-07-21 DIAGNOSIS — E1169 Type 2 diabetes mellitus with other specified complication: Secondary | ICD-10-CM

## 2022-07-21 DIAGNOSIS — I10 Essential (primary) hypertension: Secondary | ICD-10-CM

## 2022-07-21 DIAGNOSIS — I152 Hypertension secondary to endocrine disorders: Secondary | ICD-10-CM

## 2022-07-21 DIAGNOSIS — E669 Obesity, unspecified: Secondary | ICD-10-CM

## 2022-07-21 NOTE — Telephone Encounter (Unsigned)
Copied from [place] [phone]. Topic: General - Other >> Jul 21, 2022 11:55 AM Everette C wrote: Reason for CRM: Medication Refill - Medication: atorvastatin (LIPITOR) 20 MG tablet [[id number]]   amLODipine (NORVASC) 5 MG tablet [[id number]]   Semaglutide, 1 MG/DOSE, 4 MG/3ML SOPN [[id number]]   Has the patient contacted their pharmacy? Yes.  Patient has been in contact with their pharmacy but been unable to communicate successfully  (Agent: If no, request that the patient contact the pharmacy for the refill. If patient does not wish to contact the pharmacy document the reason why and proceed with request.) (Agent: If yes, when and what did the pharmacy advise?)  Preferred Pharmacy (with phone number or street name): [place], [place] - [address] [place] [place] [address] Phone: [phone] Fax: [phone] Hours: Not open 24 hours   Has the patient been seen for an appointment in the last year OR does the patient have an upcoming appointment? Yes.    Agent: Please be advised that RX refills may take up to 3 business days. We ask that you follow-up with your pharmacy.

## 2022-07-22 NOTE — Telephone Encounter (Signed)
Requested medication (s) are due for refill today: yes  Requested medication (s) are on the active medication list: yes  Last refill:  03/04/22  Future visit scheduled: no  Notes to clinic:  Unable to refill per protocol, medication last refilled by provider  no longer at the practice, routing for review.     Requested Prescriptions  Pending Prescriptions Disp Refills   atorvastatin (LIPITOR) 20 MG tablet 90 tablet 0    Sig: Take 1 tablet (20 mg total) by mouth daily.     Cardiovascular:  Antilipid - Statins Failed - 07/21/2022  3:24 PM      Failed - Lipid Panel in normal range within the last 12 months    Cholesterol, Total  Date Value Ref Range Status  07/16/2021 193 100 - 199 mg/dL Final   LDL Chol Calc (NIH)  Date Value Ref Range Status  07/16/2021 114 (H) 0 - 99 mg/dL Final   HDL  Date Value Ref Range Status  07/16/2021 39 (L) >39 mg/dL Final   Triglycerides  Date Value Ref Range Status  07/16/2021 229 (H) 0 - 149 mg/dL Final         Passed - Patient is not pregnant      Passed - Valid encounter within last 12 months    Recent Outpatient Visits           4 months ago Diabetes mellitus without complication ([place])   [facility] Vigg, Avanti, MD   5 months ago [place] Vigg, Avanti, MD   9 months ago Neck pain   [facility] Vigg, Avanti, MD   11 months ago Screening for colon cancer   [place] Vigg, Avanti, MD   1 year ago Encounter for screening for malignant neoplasm of breast, unspecified screening modality   [facility] Vigg, Avanti, MD               amLODipine (NORVASC) 5 MG tablet 90 tablet 0    Sig: Take 1 tablet (5 mg total) by mouth daily.     Cardiovascular: Calcium Channel Blockers 2 Failed - 07/21/2022  3:24 PM      Failed - Last BP in normal range    BP Readings from Last 1 Encounters:  04/02/22 (!) 143/95         Passed - Last Heart Rate in normal range     Pulse Readings from Last 1 Encounters:  04/02/22 89         Passed - Valid encounter within last 6 months    Recent Outpatient Visits           4 months ago Diabetes mellitus without complication ([place])   [facility] Vigg, Avanti, MD   5 months ago [place] Vigg, Avanti, MD   9 months ago Neck pain   [facility] Vigg, Avanti, MD   11 months ago Screening for colon cancer   [place] Vigg, Avanti, MD   1 year ago Encounter for screening for malignant neoplasm of breast, unspecified screening modality   [facility] Vigg, Avanti, MD               Semaglutide, 1 MG/DOSE, 4 MG/3ML SOPN 3 mL 4    Sig: Inject 1 mg as directed once a week.     Endocrinology:  Diabetes - GLP-1 Receptor Agonists - semaglutide Failed - 07/21/2022  3:24 PM  Failed - HBA1C in normal range and within 180 days    HB A1C (BAYER DCA - WAIVED)  Date Value Ref Range Status  03/19/2022 11.1 (H) 4.8 - 5.6 % Final    Comment:             Prediabetes: 5.7 - 6.4          Diabetes: >6.4          Glycemic control for adults with diabetes: <7.0          Passed - Cr in normal range and within 360 days    Creatinine, Ser  Date Value Ref Range Status  01/28/2022 0.87 0.57 - 1.00 mg/dL Final         Passed - Valid encounter within last 6 months    Recent Outpatient Visits           4 months ago Diabetes mellitus without complication (HCC)   [facility] Vigg, Avanti, MD   5 months ago Rash   [facility] Vigg, Avanti, MD   9 months ago Neck pain   [facility] Vigg, Avanti, MD   11 months ago Screening for colon cancer   [facility] Vigg, Avanti, MD   1 year ago Encounter for screening for malignant neoplasm of breast, unspecified screening modality   [facility] Loura Pardon, MD

## 2022-07-25 ENCOUNTER — Ambulatory Visit: Payer: 59 | Admitting: Physician Assistant

## 2022-07-25 ENCOUNTER — Other Ambulatory Visit: Payer: Self-pay | Admitting: *Deleted

## 2022-07-25 ENCOUNTER — Encounter: Payer: Self-pay | Admitting: Physician Assistant

## 2022-07-25 VITALS — BP 131/82 | HR 82 | Temp 98.0°F | Wt 188.5 lb

## 2022-07-25 DIAGNOSIS — F339 Major depressive disorder, recurrent, unspecified: Secondary | ICD-10-CM

## 2022-07-25 DIAGNOSIS — E1165 Type 2 diabetes mellitus with hyperglycemia: Secondary | ICD-10-CM | POA: Insufficient documentation

## 2022-07-25 DIAGNOSIS — Z1231 Encounter for screening mammogram for malignant neoplasm of breast: Secondary | ICD-10-CM

## 2022-07-25 DIAGNOSIS — I152 Hypertension secondary to endocrine disorders: Secondary | ICD-10-CM

## 2022-07-25 DIAGNOSIS — E1159 Type 2 diabetes mellitus with other circulatory complications: Secondary | ICD-10-CM | POA: Diagnosis not present

## 2022-07-25 DIAGNOSIS — E785 Hyperlipidemia, unspecified: Secondary | ICD-10-CM

## 2022-07-25 LAB — BAYER DCA HB A1C WAIVED: HB A1C (BAYER DCA - WAIVED): 8.3 % — ABNORMAL HIGH (ref 4.8–5.6)

## 2022-07-25 LAB — MICROALBUMIN, URINE WAIVED
Creatinine, Urine Waived: 200 mg/dL (ref 10–300)
Microalb, Ur Waived: 30 mg/L — ABNORMAL HIGH (ref 0–19)
Microalb/Creat Ratio: <30 mg/g (ref <30)

## 2022-07-25 MED ORDER — VENLAFAXINE HCL ER 37.5 MG PO CP24: 37.5000 mg | ORAL_CAPSULE | Freq: Every day | ORAL | 1 refills | Status: DC

## 2022-07-25 MED ORDER — ATORVASTATIN CALCIUM 20 MG PO TABS: 20.0000 mg | ORAL_TABLET | Freq: Every day | ORAL | 0 refills | Status: DC

## 2022-07-25 MED ORDER — DULOXETINE HCL 30 MG PO CPEP: 30.0000 mg | ORAL_CAPSULE | Freq: Every day | ORAL | 0 refills | Status: DC

## 2022-07-25 MED ORDER — AMLODIPINE BESYLATE 5 MG PO TABS: 5.0000 mg | ORAL_TABLET | Freq: Every day | ORAL | 0 refills | Status: DC

## 2022-07-25 MED ORDER — OZEMPIC (2 MG/DOSE) 8 MG/3ML ~~LOC~~ SOPN: 2.0000 mg | PEN_INJECTOR | SUBCUTANEOUS | 1 refills | Status: DC

## 2022-07-25 NOTE — Patient Instructions (Addendum)
I would like you to take Cymbalta 30 mg by mouth once per day for 7 days then stop and start taking the Effexor 37.5 mg by mouth once per day    Please continue your other medications at this time  I have placed a referral to Nutrition services to help with education on diabetes and lifestyle changes to help manage this condition. Please make an apt at your convenience to participate in this as it can really help with diet and exercise efforts.   Please make an apt to see [place] in 3 months to monitor your diabetes and get lab work These labs will need to be fasting so please do not eat or drink for at least 8 hours prior to your apt ( you can have water and/or black coffee only)

## 2022-07-25 NOTE — Progress Notes (Signed)
Established Patient Office Visit  Name: Cindy Carson   MRN: [id number]    DOB: 07/10/1971   Date:07/25/2022  Today's Provider: Talitha Givens, MHS, PA-C Introduced myself to the patient as a PA-C and provided education on APPs in clinical practice.         Subjective  Chief Complaint  Chief Complaint  Patient presents with   Diabetes    No recent eye exam per patient   Hyperlipidemia   Hypertension   Depression    HPI  Diabetes, Type 2 - Last A1c 11.1 - Medications: Semaglutide 1 mg/ dose once per week  - Compliance: excellent  - Checking BG at home: Not checked regularly at home - Diet: She is not following a diabetes conscious diet at this time  - Exercise: She is not engaged in regular exercise  - Eye exam: Discussed importance of annual eye exams to check for retinopathy and having results sent to office to keep records updated  - Foot exam: Completed today  - Microalbumin: Ordered today  - Statin: on statin therapy  - PNA vaccine: Will discuss at follow up  - Denies symptoms of hypoglycemia, polyuria, polydipsia, numbness extremities, foot ulcers/trauma   HYPERTENSION / No Name Satisfied with current treatment? yes Duration of hypertension: years BP monitoring frequency: a few times a month BP range: 130s/ 80s  BP medication side effects: no Past BP meds: amlodipine and losartan (cozaar) Duration of hyperlipidemia: chronic Cholesterol medication side effects: no Cholesterol supplements: none Past cholesterol medications: atorvastain (lipitor) Medication compliance: excellent compliance Aspirin: no Recent stressors: no Recurrent headaches: no Visual changes: no Palpitations: no Dyspnea: no Chest pain: no Lower extremity edema: no Dizzy/lightheaded: no  DEPRESSION Mood status: worse Reports she has had 2 funerals and "I don't like my husband anymore and I feel stuck living in "   Satisfied with current treatment?: no Symptom severity:  severe  Duration of current treatment : chronic Side effects: no Medication compliance: good compliance Psychotherapy/counseling: no in the past Previous psychiatric medications: cymbalta unsure if she has been on other medications in the past  Depressed mood: yes Anxious mood: yes Anhedonia: yes Significant weight loss or gain: no Insomnia: yes hard to stay asleep Fatigue: yes Feelings of worthlessness or guilt: yes Impaired concentration/indecisiveness: yes reports concerns for memory troubles  Suicidal ideations: no Hopelessness: yes Crying spells: no    07/25/2022   10:45 AM 01/28/2022    4:09 PM 07/11/2021    8:23 AM 12/09/2018    9:21 AM  Depression screen PHQ 2/9  Decreased Interest _0 0  Down, Depressed, Hopeless _1 0  PHQ - 2 Score _2 0  Altered sleeping _3 Tired, decreased energy _4 Change in appetite _5 Feeling bad or failure about yourself  _6 Trouble concentrating _7 Moving slowly or fidgety/restless 2 1 0   Suicidal thoughts 0 0 0   PHQ-9 Score _8 Difficult doing work/chores Somewhat difficult Very difficult Somewhat difficult       Patient Active Problem List   Diagnosis Date Noted   Type 2 diabetes mellitus with hyperglycemia, without long-term current use of insulin ([place]) 07/25/2022   Depression, recurrent ([place]) 07/25/2022   Encounter for screening for malignant neoplasm of breast 07/11/2021   Hyperlipidemia associated with type 2 diabetes mellitus ([place])  07/11/2021   Personal history of noncompliance with medical treatment, presenting hazards to health 02/15/2019   History of syphilis 12/09/2018   Bursitis of hip 12/09/2018   Anemia 12/09/2018   Obesity 12/09/2018   OSA (obstructive sleep apnea)    HTN (hypertension)    Hypertension complicating diabetes ([place])    Chronic low back pain    Chronic neck pain    Diabetes type 2, uncontrolled     Past Surgical History:  Procedure Laterality Date    COLONOSCOPY     COLONOSCOPY N/A 09/03/2021   Procedure: COLONOSCOPY;  Surgeon: Lucilla Lame, MD;  Location: [facility] ENDOSCOPY;  Service: Endoscopy;  Laterality: N/A;   TUBAL LIGATION      Family History  Problem Relation Age of Onset   Varicose Veins Father    Diabetes Sister    Diabetes Sister    Heart murmur Brother     Social History   Tobacco Use   Smoking status: Never   Smokeless tobacco: Never  Substance Use Topics   Alcohol use: Never     Current Outpatient Medications:    amLODipine (NORVASC) 5 MG tablet, Take 1 tablet by mouth once daily, Disp: 90 tablet, Rfl: 0   atorvastatin (LIPITOR) 20 MG tablet, Take 1 tablet by mouth once daily, Disp: 90 tablet, Rfl: 0   Blood Glucose Monitoring Suppl (ONETOUCH VERIO IQ SYSTEM) w/Device KIT, Test twice a day, one touch verio flex meter, Disp: 1 kit, Rfl: 0   DULoxetine (CYMBALTA) 30 MG capsule, Take 1 capsule (30 mg total) by mouth daily for 7 days., Disp: 7 capsule, Rfl: 0   losartan (COZAAR) 25 MG tablet, Take 25 mg by mouth daily., Disp: , Rfl:    Semaglutide, 2 MG/DOSE, (OZEMPIC, 2 MG/DOSE,) 8 MG/3ML SOPN, Inject 2 mg into the skin once a week., Disp: 9 mL, Rfl: 1   venlafaxine XR (EFFEXOR XR) 37.5 MG 24 hr capsule, Take 1 capsule (37.5 mg total) by mouth daily with breakfast., Disp: 30 capsule, Rfl: 1  Allergies  Allergen Reactions   Nsaids Nausea Only    nausea    I personally reviewed active problem list, medication list, allergies, health maintenance, notes from last encounter, lab results with the patient/caregiver today.   Review of Systems  HENT:  Negative for hearing loss and tinnitus.   Eyes:  Negative for blurred vision and double vision.  Respiratory:  Negative for shortness of breath and wheezing.   Cardiovascular:  Negative for chest pain, palpitations and leg swelling.  Genitourinary:  Negative for frequency and urgency.  Neurological:  Negative for dizziness and headaches.  Endo/Heme/Allergies:   Positive for polydipsia.      Objective  Vitals:   07/25/22 1036  BP: 131/82  Pulse: 82  Temp: 98 F (36.7 C)  TempSrc: Oral  SpO2: 95%  Weight: 188 lb 8 oz (85.5 kg)    Body mass index is 34.48 kg/m.  Physical Exam Vitals reviewed.  Constitutional:      General: She is awake.     Appearance: Normal appearance. She is well-developed and well-groomed.  HENT:     Head: Normocephalic and atraumatic.     Mouth/Throat:     Lips: Pink.  Eyes:     General: Lids are normal. Gaze aligned appropriately.     Extraocular Movements: Extraocular movements intact.     Conjunctiva/sclera: Conjunctivae normal.     Pupils: Pupils are equal, round, and reactive to light.  Cardiovascular:     Rate and  Rhythm: Normal rate and regular rhythm.     Pulses: Normal pulses.          Radial pulses are 2+ on the right side and 2+ on the left side.       Dorsalis pedis pulses are 2+ on the right side and 2+ on the left side.       Posterior tibial pulses are 2+ on the right side and 2+ on the left side.     Heart sounds: Normal heart sounds. No murmur heard.    No friction rub. No gallop.  Pulmonary:     Effort: Pulmonary effort is normal.     Breath sounds: Normal breath sounds. No decreased air movement. No decreased breath sounds, wheezing, rhonchi or rales.  Musculoskeletal:     Right lower leg: No edema.     Left lower leg: No edema.  Neurological:     General: No focal deficit present.     Mental Status: She is alert and oriented to person, place, and time.     GCS: GCS eye subscore is 4. GCS verbal subscore is 5. GCS motor subscore is 6.     Cranial Nerves: No dysarthria or facial asymmetry.  Psychiatric:        Attention and Perception: Attention and perception normal.        Mood and Affect: Mood normal. Affect is flat.        Speech: Speech normal.        Behavior: Behavior is withdrawn. Behavior is cooperative.        Thought Content: Thought content normal.      Recent  Results (from the past 2160 hour(s))  Bayer DCA Hb A1c Waived (STAT)     Status: Abnormal   Collection Time: 07/25/22 11:13 AM  Result Value Ref Range   HB A1C (BAYER DCA - WAIVED) 8.3 (H) 4.8 - 5.6 %    Comment:          Prediabetes: 5.7 - 6.4          Diabetes: >6.4          Glycemic control for adults with diabetes: <7.0   Microalbumin, Urine Waived (STAT)     Status: Abnormal   Collection Time: 07/25/22 11:20 AM  Result Value Ref Range   Microalb, Ur Waived 30 (H) 0 - 19 mg/L   Creatinine, Urine Waived 200 10 - 300 mg/dL   Microalb/Creat Ratio <30 <30 mg/g    Comment:                              Abnormal:       30 - 300                         High Abnormal:           >300      PHQ2/9:    07/25/2022   10:45 AM 01/28/2022    4:09 PM 07/11/2021    8:23 AM 12/09/2018    9:21 AM  Depression screen PHQ 2/9  Decreased Interest _0 0  Down, Depressed, Hopeless _1 0  PHQ - 2 Score _2 0  Altered sleeping _3 Tired, decreased energy _4 Change in appetite _5 Feeling bad or failure about yourself  2 3 2  Trouble concentrating _0 Moving slowly or fidgety/restless 2 1 0   Suicidal thoughts 0 0 0   PHQ-9 Score _1 Difficult doing work/chores Somewhat difficult Very difficult Somewhat difficult       Fall Risk:    07/25/2022   10:45 AM 07/25/2022   10:44 AM 01/28/2022    4:09 PM 08/01/2021    3:56 PM 07/11/2021    8:23 AM  Fall Risk   Falls in the past year? 1 1 0 0 0  Number falls in past yr: 0 0 0 0 0  Injury with Fall? 1 1 0 0 0  Risk for fall due to : No Fall Risks  No Fall Risks No Fall Risks No Fall Risks  Follow up Falls evaluation completed  Falls evaluation completed Falls evaluation completed Falls evaluation completed      Functional Status Survey:      Assessment & Plan  Problem List Items Addressed This Visit       Cardiovascular and Mediastinum   Hypertension complicating diabetes ([place])    Chronic,  ongoing Appears well controlled today She is taking Amlodipine 5 mg PO QD and Losartan 25 mg PO QD and reports she is tolerating well Continue current medications Recommend engaging with lifestyle education to assist with diet and exercise efforts as this will provide further cardiovascular benefit Follow up in 3 months for monitoring       Relevant Medications   Semaglutide, 2 MG/DOSE, (OZEMPIC, 2 MG/DOSE,) 8 MG/3ML SOPN   Other Relevant Orders   Ambulatory referral to diabetic education     Endocrine   Type 2 diabetes mellitus with hyperglycemia, without long-term current use of insulin (HCC) - Primary    Chronic, ongoing concern Most recent A1c in June 2023 was 11.1 She is taking Ozempic 1 mg/dose once weekly and reports good compliance She is not checking glucose regularly at home and reports she has never been sent for nutrition or exercise education Will place referral to diabetes education services to assist with patient education A1c today was 8.3 which is significantly improved from previous Recommend increasing ozempic dose for further control and follow up in 3 months for monitoring        Relevant Medications   Semaglutide, 2 MG/DOSE, (OZEMPIC, 2 MG/DOSE,) 8 MG/3ML SOPN   Other Relevant Orders   Bayer DCA Hb A1c Waived (STAT) (Completed)   Microalbumin, Urine Waived (STAT) (Completed)   Ambulatory referral to diabetic education     Other   Encounter for screening for malignant neoplasm of breast   Relevant Orders   MM 3D SCREEN BREAST BILATERAL   Depression, recurrent ([place])    Chronic, ongoing and not well controlled  PHQ9 was 20/27 today  She has been taking Cymbalta for some time now and reports little effect She is amenable to switching medication today Will taper Cymbalta to 30 mg PO QD for 7 days then switch to Effexor 37.5 mg PO QD  Will place referral for therapy services to assist with depressed mood further Follow up in 6 weeks to assess progress and  response to medication changes.       Relevant Medications   venlafaxine XR (EFFEXOR XR) 37.5 MG 24 hr capsule   DULoxetine (CYMBALTA) 30 MG capsule   Other Relevant Orders   Ambulatory referral to Psychology     Return in about 6 weeks (around 09/05/2022) for Depression.   I, Milus Fritze E Joyanna Kleman, PA-C, have  reviewed all documentation for this visit. The documentation on 07/25/22 for the exam, diagnosis, procedures, and orders are all accurate and complete.   Talitha Givens, MHS, PA-C [facility]

## 2022-07-25 NOTE — Telephone Encounter (Signed)
Appointment 09/05/22- courtesy RF #[id number] given Requested Prescriptions  Pending Prescriptions Disp Refills   atorvastatin (LIPITOR) 20 MG tablet 30 tablet 0    Sig: Take 1 tablet (20 mg total) by mouth daily.     Cardiovascular:  Antilipid - Statins Failed - 07/25/2022  3:20 PM      Failed - Lipid Panel in normal range within the last 12 months    Cholesterol, Total  Date Value Ref Range Status  07/16/2021 193 100 - 199 mg/dL Final   LDL Chol Calc (NIH)  Date Value Ref Range Status  07/16/2021 114 (H) 0 - 99 mg/dL Final   HDL  Date Value Ref Range Status  07/16/2021 39 (L) >39 mg/dL Final   Triglycerides  Date Value Ref Range Status  07/16/2021 229 (H) 0 - 149 mg/dL Final         Passed - Patient is not pregnant      Passed - Valid encounter within last 12 months    Recent Outpatient Visits           Today Type 2 diabetes mellitus with hyperglycemia, without long-term current use of insulin ([place])   [facility] Mecum, Erin E, PA-C   4 months ago Diabetes mellitus without complication ([place])   [facility] Vigg, Avanti, MD   5 months ago [place] Vigg, Avanti, MD   9 months ago Neck pain   [place] Vigg, Avanti, MD   11 months ago Screening for colon cancer   [place], MD       Future Appointments             In 1 month Jon Billings, NP [employer], [facility]   In 3 months Jon Billings, NP [facility], [facility]             amLODipine (NORVASC) 5 MG tablet 30 tablet 0    Sig: Take 1 tablet (5 mg total) by mouth daily.     Cardiovascular: Calcium Channel Blockers 2 Passed - 07/25/2022  3:20 PM      Passed - Last BP in normal range    BP Readings from Last 1 Encounters:  07/25/22 131/82         Passed - Last Heart Rate in normal range    Pulse Readings from Last 1 Encounters:  07/25/22 82         Passed - Valid encounter within last 6  months    Recent Outpatient Visits           Today Type 2 diabetes mellitus with hyperglycemia, without long-term current use of insulin ([place])   [place] Mecum, Erin E, PA-C   4 months ago Diabetes mellitus without complication ([place])   [facility] Vigg, Avanti, MD   5 months ago [place], MD   9 months ago Neck pain   [place], MD   11 months ago Screening for colon cancer   [facility] Vigg, Avanti, MD       Future Appointments             In 1 month Jon Billings, NP [facility], [place]   In 3 months Jon Billings, NP [employer], [place]

## 2022-07-25 NOTE — Assessment & Plan Note (Signed)
Chronic, ongoing and not well controlled  PHQ9 was 20/27 today  She has been taking Cymbalta for some time now and reports little effect She is amenable to switching medication today Will taper Cymbalta to 30 mg PO QD for 7 days then switch to Effexor 37.5 mg PO QD  Will place referral for therapy services to assist with depressed mood further Follow up in 6 weeks to assess progress and response to medication changes.

## 2022-07-25 NOTE — Assessment & Plan Note (Addendum)
Chronic, ongoing concern Most recent A1c in June 2023 was 11.1 She is taking Ozempic 1 mg/dose once weekly and reports good compliance She is not checking glucose regularly at home and reports she has never been sent for nutrition or exercise education Will place referral to diabetes education services to assist with patient education A1c today was 8.3 which is significantly improved from previous Recommend increasing ozempic dose for further control and follow up in 3 months for monitoring

## 2022-07-25 NOTE — Assessment & Plan Note (Signed)
Chronic, ongoing Appears well controlled today She is taking Amlodipine 5 mg PO QD and Losartan 25 mg PO QD and reports she is tolerating well Continue current medications Recommend engaging with lifestyle education to assist with diet and exercise efforts as this will provide further cardiovascular benefit Follow up in 3 months for monitoring

## 2022-07-29 ENCOUNTER — Other Ambulatory Visit: Payer: Self-pay | Admitting: Physician Assistant

## 2022-07-29 ENCOUNTER — Other Ambulatory Visit (HOSPITAL_BASED_OUTPATIENT_CLINIC_OR_DEPARTMENT_OTHER): Payer: Self-pay

## 2022-07-29 DIAGNOSIS — E1165 Type 2 diabetes mellitus with hyperglycemia: Secondary | ICD-10-CM

## 2022-07-29 MED ORDER — OZEMPIC (2 MG/DOSE) 8 MG/3ML ~~LOC~~ SOPN
2.0000 mg | PEN_INJECTOR | SUBCUTANEOUS | 1 refills | Status: DC
  Filled 2022-07-29 – 2022-07-30 (×2): qty 3, 28d supply, fill #0
  Filled 2022-09-28: qty 3, 28d supply, fill #1

## 2022-07-30 ENCOUNTER — Other Ambulatory Visit (HOSPITAL_BASED_OUTPATIENT_CLINIC_OR_DEPARTMENT_OTHER): Payer: Self-pay

## 2022-07-30 ENCOUNTER — Other Ambulatory Visit (HOSPITAL_COMMUNITY): Payer: Self-pay

## 2022-07-31 ENCOUNTER — Other Ambulatory Visit (HOSPITAL_BASED_OUTPATIENT_CLINIC_OR_DEPARTMENT_OTHER): Payer: Self-pay

## 2022-08-19 ENCOUNTER — Other Ambulatory Visit: Payer: Self-pay | Admitting: Physician Assistant

## 2022-08-19 DIAGNOSIS — F339 Major depressive disorder, recurrent, unspecified: Secondary | ICD-10-CM

## 2022-08-21 ENCOUNTER — Other Ambulatory Visit: Payer: Self-pay | Admitting: Physician Assistant

## 2022-08-21 DIAGNOSIS — F339 Major depressive disorder, recurrent, unspecified: Secondary | ICD-10-CM

## 2022-08-21 MED ORDER — DULOXETINE HCL 30 MG PO CPEP: 30.0000 mg | ORAL_CAPSULE | Freq: Every day | ORAL | 0 refills | Status: DC

## 2022-08-25 ENCOUNTER — Ambulatory Visit (HOSPITAL_BASED_OUTPATIENT_CLINIC_OR_DEPARTMENT_OTHER): Payer: 59 | Attending: Orthopedic Surgery | Admitting: Physical Therapy

## 2022-08-25 ENCOUNTER — Encounter (HOSPITAL_BASED_OUTPATIENT_CLINIC_OR_DEPARTMENT_OTHER): Payer: Self-pay | Admitting: Physical Therapy

## 2022-08-25 DIAGNOSIS — M6281 Muscle weakness (generalized): Secondary | ICD-10-CM | POA: Diagnosis not present

## 2022-08-25 DIAGNOSIS — G8929 Other chronic pain: Secondary | ICD-10-CM | POA: Diagnosis not present

## 2022-08-25 DIAGNOSIS — M25512 Pain in left shoulder: Secondary | ICD-10-CM | POA: Diagnosis present

## 2022-08-25 NOTE — Therapy (Signed)
OUTPATIENT PHYSICAL THERAPY SHOULDER EVALUATION   Patient Name: Cindy Carson MRN: [id number] DOB:[birth year], 51 y.o., female Today's Date: 08/25/2022  END OF SESSION:  PT End of Session - 08/25/22 1034     Visit Number 1    Number of Visits 13    Date for PT Re-Evaluation 10/06/22    Authorization Type UMR    Authorization Time Period 08/25/22 to 10/20/22    PT Start Time 0933    PT Stop Time 1015    PT Time Calculation (min) 42 min    Activity Tolerance Patient tolerated treatment well    [facility] tasks assessed/performed             Past Medical History:  Diagnosis Date   Anemia 12/09/2018   Chronic low back pain    Chronic neck pain    Diabetes type 2, uncontrolled    diagnosed in 2019   Hyperlipidemia 08/01/2021   Hypertension complicating diabetes (HCC)    Neck pain 07/11/2021   Obesity 12/09/2018   OSA (obstructive sleep apnea)    sleep study done in [place] in 2019 per pt   Rash 01/28/2022   Screening for colon cancer 08/01/2021   Past Surgical History:  Procedure Laterality Date   COLONOSCOPY     COLONOSCOPY N/A 09/03/2021   Procedure: COLONOSCOPY;  Surgeon: Midge MiniumWohl, Darren, MD;  [facility];  Service: Endoscopy;  Laterality: N/A;   TUBAL LIGATION     Patient Active Problem List   Diagnosis Date Noted   Type 2 diabetes mellitus with hyperglycemia, without long-term current use of insulin (HCC) 07/25/2022   Depression, recurrent (HCC) 07/25/2022   Encounter for screening for malignant neoplasm of breast 07/11/2021   Hyperlipidemia associated with type 2 diabetes mellitus (HCC) 07/11/2021   Personal history of noncompliance with medical treatment, presenting hazards to health 02/15/2019   History of syphilis 12/09/2018   Bursitis of hip 12/09/2018   Anemia 12/09/2018   Obesity 12/09/2018   OSA (obstructive sleep apnea)    HTN (hypertension)    Hypertension complicating diabetes (HCC)    Chronic low back pain     Chronic neck pain    Diabetes type 2, uncontrolled     PCP: Larae GroomsHoldsworth, Karen NP   REFERRING PROVIDER: Juanell FairlyKrasinski, Kevin, MD  REFERRING DIAG: [phone] (ICD-10-CM) - Strain of unspecified muscle, fascia and tendon at shoulder and upper arm level, left arm, subsequent encounte  THERAPY DIAG:  Chronic left shoulder pain  Muscle weakness (generalized)  Rationale for Evaluation and Treatment: Rehabilitation  ONSET DATE: 07/31/2022  SUBJECTIVE:  SUBJECTIVE STATEMENT:  My shoulder has been paining me every day, I had PT earlier this year for this same problem they had me doing all kinds of exercises with my arm. MRI said I have a 50% tear in my rotator cuff, MD gave me cortisone shot and then sent me back to PT. It was OK after I finished therapy before but it was still hurting. I didn't keep up with the HEP from PT often. Pain is in the front of my elbow now too for some reason. Its hard for me to sleep due to pain.   PERTINENT HISTORY: 07/25/2022 07/25/2022  ASSESSMENT:       Left shoulder pain secondary to impingement versus partial rotator cuff tear.         PLAN:      I recommended Ms. Helmkamp that we try her on a Medrol Dosepak. She is diabetic and will monitor closely her blood sugars. If her blood sugars elevate significantly, she will stop the steroid medication. I am also referring her back to physical therapy as I think this is important to getting her range of motion and strength back. She will follow up with me in 6 to 8 weeks. If she has persistent pain, she may be considered for an intra-articular injection of corticosteroid versus considering arthroscopic evaluation of the left shoulder. Patient understood and agreed with this plan.     PAIN:  Are you having pain? Yes: NPRS scale:  7-8/10; can get to 9/10 at worst, never gets less than 7/10 Pain location: front of L elbow and all of the L shoulder  Pain description: throbbing, aching Aggravating factors: moving arm too much like for housework, dressing, sleeping hurts  Relieving factors: propping/supporting arm   PRECAUTIONS: None  WEIGHT BEARING RESTRICTIONS: No  FALLS:  Has patient fallen in last 6 months? Yes. 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   FINDINGS: No evidence of fracture or dislocation. Mild degenerative changes of the acromioclavicular joint. Soft tissues are unremarkable.   IMPRESSION: No acute osseous abnormality of the left shoulder or humerus.    PATIENT SURVEYS:  FOTO 36  COGNITION: Overall cognitive status: Within functional limits for tasks assessed     SENSATION: Not tested  POSTURE: Forward head, rounded shoulders, decreased lumbar and thoracic lordosis   UPPER EXTREMITY ROM:   Active ROM Right eval Left eval  Shoulder flexion 130 85 pain limited   Shoulder abduction  153 35 pain limited   Shoulder extension     Shoulder adduction    Shoulder internal rotation FIR L5  FIR mid right glute  Shoulder external rotation FER C7  FER to occiput   Elbow flexion    Elbow extension    Wrist flexion    Wrist extension    Wrist ulnar deviation    Wrist radial deviation    Wrist pronation    Wrist supination     (  Blank rows = not tested)  Cervical ROM:  flexion and extension WNL, L lateral flexion moderate limitation pain limited, R lateral flexion mild limitation; rotation B mild limitation   Thoracic ROM:  flexion OK, severe limitation thoracic extension, moderate limitation B lateral flexion and rotation   UPPER EXTREMITY MMT:  MMT Right eval Left eval  Shoulder flexion 4+ 3 pain limited   Shoulder extension 4 3- pain limited   Shoulder abduction 4+ 2 pain limited   Shoulder adduction    Shoulder internal rotation  DNT known 50% cuff tear   Shoulder external rotation  DNT known 50% cuff tear   Middle trapezius    Lower trapezius    Elbow flexion 4 4  Elbow extension 4 3 pain limited   Wrist flexion    Wrist extension    Wrist ulnar deviation    Wrist radial deviation    Wrist pronation    Wrist supination    Grip strength (lbs)    (Blank rows = not tested)  SHOULDER SPECIAL TESTS: Impingement tests:  SLAP lesions:  Instability tests:  Rotator cuff assessment:  Biceps assessment:   JOINT MOBILITY TESTING:    PALPATION:   B upper traps with multiple trigger points and TTP, L rotator cuff TTP, L anterior/posterior/especially middle delt very TTP, L biceps TTP    TODAY'S TREATMENT:                                                                                                                                         DATE:   Eval   Objective measures + education  TherEx  Supine shoulder flexion x10 with Cane AAROM Seated shoulder flexion stretch at table x10 Pendulums AP and lateral      PATIENT EDUCATION: Education details: exam findings, POC, HEP  Person educated: Patient Education method: Programmer, multimedia, Demonstration, and Handouts Education comprehension: verbalized understanding, returned demonstration, and needs further education  HOME EXERCISE PROGRAM:  Access Code: [email or web address] URL: [email or web address] Date: 08/25/2022 Prepared by: Nedra Hai  Exercises - Seated Shoulder Flexion Table Top Stretch  - 2 x daily - 7 x weekly - 1 sets - 10 reps - 5 hold - Seated Shoulder Abduction Towel Slide at Table Top  - 2 x daily - 7 x weekly - 1 sets - 10 reps - 5 hold - Flexion-Extension Shoulder Pendulum with Table Support  - 3 x daily - 7 x weekly - 1 sets - 20 reps - Horizontal Shoulder Pendulum with Table Support  - 3 x daily - 7 x weekly - 1 sets - 20 reps  ASSESSMENT:  CLINICAL IMPRESSION: Patient is a 51 y.o. F who was seen today for physical therapy evaluation and treatment for L shoulder pain. Of note, she did have a significant fall onto that arm this summer which caused this injury, she also had PT earlier this year which gave some  relief but per her report her shoulder is getting worse. Exam is consistent with impairments related to rotator cuff tear. Will make every effort to improve pain and strength but will monitor closely.   OBJECTIVE IMPAIRMENTS: decreased ROM, decreased strength, hypomobility, increased fascial restrictions, increased muscle spasms, impaired UE functional use, improper body mechanics, postural dysfunction, and pain.   ACTIVITY LIMITATIONS: carrying, lifting, transfers, bed mobility, bathing, toileting, dressing, and reach over head  PARTICIPATION LIMITATIONS: cleaning, laundry, driving, shopping, community activity, and occupation  PERSONAL FACTORS: Age, Behavior pattern, Fitness, Past/current experiences, Social background, and Time since onset of injury/illness/exacerbation are also affecting patient's functional outcome.   REHAB POTENTIAL: Fair limited results from PT earlier this year, new 50% tear   CLINICAL DECISION MAKING: Stable/uncomplicated  EVALUATION COMPLEXITY: Moderate   GOALS: Goals reviewed with patient? Yes  SHORT TERM GOALS: Target date: 09/15/2022    Will be compliant with appropriate progressive HEP  Baseline: Goal status: INITIAL  2.  L shoulder ROM to be equal to that  of R  Baseline:  Goal status: INITIAL  3.  Pain in L shoulder to be no more than 5/10 Baseline:  Goal status: INITIAL  4.  Thoracic ROM limitations to have resolved  Baseline:  Goal status: INITIAL   LONG TERM GOALS: Target date: 10/06/2022    MMT to improve by 1 grade in all weak groups  Baseline:  Goal status: INITIAL  2.  Pain in L shoulder to be no more than 3/10 Baseline:  Goal status: INITIAL  3.  Will be able to perform all work duties without increase in pain  Baseline:  Goal status: INITIAL  4.  Will be able to perform all dressing/bathing tasks without increase in pain  Baseline:  Goal status: INITIAL   PLAN:  PT FREQUENCY: 2x/week  PT DURATION: 6 weeks  PLANNED INTERVENTIONS: Therapeutic exercises, Therapeutic activity, Neuromuscular re-education, Balance training, Gait training, Patient/Family education, Self Care, Joint mobilization, Aquatic Therapy, Dry Needling, Electrical stimulation, Cryotherapy, Moist heat, Taping, Ultrasound, Ionotophoresis 4mg /ml Dexamethasone, Manual therapy, and Re-evaluation  PLAN FOR NEXT SESSION: gentle progression as tolerated, activities as tolerated within pain limitations    Anselma Herbel U PT DPT PN2 08/25/2022, 10:37 AM

## 2022-08-27 ENCOUNTER — Encounter: Payer: 59 | Admitting: Radiology

## 2022-08-28 ENCOUNTER — Other Ambulatory Visit: Payer: Self-pay | Admitting: Nurse Practitioner

## 2022-08-28 DIAGNOSIS — E785 Hyperlipidemia, unspecified: Secondary | ICD-10-CM

## 2022-08-28 DIAGNOSIS — E1159 Type 2 diabetes mellitus with other circulatory complications: Secondary | ICD-10-CM

## 2022-08-28 NOTE — Telephone Encounter (Signed)
Requested medication (s) are due for refill today: yes  Requested medication (s) are on the active medication list: yes  Last refill:  07/25/22 #30 0 refills  Future visit scheduled: yes in 1 week  Notes to clinic:  do you want to give a 2nd courtesy refill?      Requested Prescriptions  Pending Prescriptions Disp Refills   atorvastatin (LIPITOR) 20 MG tablet [Pharmacy Med Name: Atorvastatin Calcium 20 MG Oral Tablet] 30 tablet 0    Sig: Take 1 tablet by mouth once daily     Cardiovascular:  Antilipid - Statins Failed - 08/28/2022 10:12 AM      Failed - Lipid Panel in normal range within the last 12 months    Cholesterol, Total  Date Value Ref Range Status  07/16/2021 193 100 - 199 mg/dL Final   LDL Chol Calc (NIH)  Date Value Ref Range Status  07/16/2021 114 (H) 0 - 99 mg/dL Final   HDL  Date Value Ref Range Status  07/16/2021 39 (L) >39 mg/dL Final   Triglycerides  Date Value Ref Range Status  07/16/2021 229 (H) 0 - 149 mg/dL Final         Passed - Patient is not pregnant      Passed - Valid encounter within last 12 months    Recent Outpatient Visits           1 month ago Type 2 diabetes mellitus with hyperglycemia, without long-term current use of insulin ([place])   [facility] Mecum, Erin E, PA-C   5 months ago Diabetes mellitus without complication ([place])   [facility] Vigg, Avanti, MD   7 months ago [place] Vigg, Avanti, MD   11 months ago Neck pain   [facility] Vigg, Customer service manager, MD   1 year ago Screening for colon cancer   [facility] Vigg, Avanti, MD       Future Appointments             In 1 week Jon Billings, NP [employer] [facility], [place]   In 2 months Jon Billings, NP [place], [facility]             amLODipine ([place]) 5 MG tablet [Pharmacy Med Name: amLODIPine Besylate 5 MG Oral Tablet] 30 tablet 0    Sig: Take 1 tablet by mouth once daily      Cardiovascular: Calcium Channel Blockers 2 Passed - 08/28/2022 10:12 AM      Passed - Last BP in normal range    BP Readings from Last 1 Encounters:  07/25/22 131/82         Passed - Last Heart Rate in normal range    Pulse Readings from Last 1 Encounters:  07/25/22 82         Passed - Valid encounter within last 6 months    Recent Outpatient Visits           1 month ago Type 2 diabetes mellitus with hyperglycemia, without long-term current use of insulin ([place])   [place], Erin E, PA-C   5 months ago Diabetes mellitus without complication ([place])   [facility] Vigg, Avanti, MD   7 months ago [place], MD   11 months ago Neck pain   [facility] Vigg, Avanti, MD   1 year ago Screening for colon cancer   [facility] Vigg, Loman [place], MD  Future Appointments             In 1 week Larae Grooms, NP [facility], [facility]   In 2 months Larae Grooms, NP [facility], [facility]

## 2022-08-29 ENCOUNTER — Ambulatory Visit: Payer: Self-pay | Admitting: *Deleted

## 2022-09-01 ENCOUNTER — Ambulatory Visit (HOSPITAL_BASED_OUTPATIENT_CLINIC_OR_DEPARTMENT_OTHER): Payer: 59 | Admitting: Physical Therapy

## 2022-09-01 ENCOUNTER — Encounter (HOSPITAL_BASED_OUTPATIENT_CLINIC_OR_DEPARTMENT_OTHER): Payer: Self-pay | Admitting: Physical Therapy

## 2022-09-01 DIAGNOSIS — M25512 Pain in left shoulder: Secondary | ICD-10-CM | POA: Diagnosis not present

## 2022-09-01 DIAGNOSIS — G8929 Other chronic pain: Secondary | ICD-10-CM

## 2022-09-01 DIAGNOSIS — M6281 Muscle weakness (generalized): Secondary | ICD-10-CM

## 2022-09-01 NOTE — Therapy (Signed)
OUTPATIENT PHYSICAL THERAPY SHOULDER TREATMENT   Patient Name: Cindy Carson MRN: [id number] DOB:Oct 04, 1970, 51 y.o., female Today's Date: 09/01/2022  END OF SESSION:  PT End of Session - 09/01/22 0941     Visit Number 2    Number of Visits 13    Date for PT Re-Evaluation 10/06/22    Authorization Type UMR    Authorization Time Period 08/25/22 to 10/20/22    PT Start Time 0933    PT Stop Time 1013    PT Time Calculation (min) 40 min    Activity Tolerance Patient tolerated treatment well    Behavior During Therapy [facility] for tasks assessed/performed              Past Medical History:  Diagnosis Date   Anemia 12/09/2018   Chronic low back pain    Chronic neck pain    Diabetes type 2, uncontrolled    diagnosed in 2019   Hyperlipidemia 08/01/2021   Hypertension complicating diabetes (HCC)    Neck pain 07/11/2021   Obesity 12/09/2018   OSA (obstructive sleep apnea)    sleep study done in [place] in 2019 per pt   Rash 01/28/2022   Screening for colon cancer 08/01/2021   Past Surgical History:  Procedure Laterality Date   COLONOSCOPY     COLONOSCOPY N/A 09/03/2021   Procedure: COLONOSCOPY;  Surgeon: Midge Minium, MD;  Location: [facility] ENDOSCOPY;  Service: Endoscopy;  Laterality: N/A;   TUBAL LIGATION     Patient Active Problem List   Diagnosis Date Noted   Type 2 diabetes mellitus with hyperglycemia, without long-term current use of insulin (HCC) 07/25/2022   Depression, recurrent (HCC) 07/25/2022   Encounter for screening for malignant neoplasm of breast 07/11/2021   Hyperlipidemia associated with type 2 diabetes mellitus (HCC) 07/11/2021   Personal history of noncompliance with medical treatment, presenting hazards to health 02/15/2019   History of syphilis 12/09/2018   Bursitis of hip 12/09/2018   Anemia 12/09/2018   Obesity 12/09/2018   OSA (obstructive sleep apnea)    HTN (hypertension)    Hypertension complicating diabetes (HCC)    Chronic low back pain     Chronic neck pain    Diabetes type 2, uncontrolled     PCP: Larae Grooms NP   REFERRING PROVIDER: Juanell Fairly, MD  REFERRING DIAG: [phone] (ICD-10-CM) - Strain of unspecified muscle, fascia and tendon at shoulder and upper arm level, left arm, subsequent encounte  THERAPY DIAG:  Chronic left shoulder pain  Muscle weakness (generalized)  Rationale for Evaluation and Treatment: Rehabilitation  ONSET DATE: 07/31/2022  SUBJECTIVE:  SUBJECTIVE STATEMENT:  Things are about the same, my doctor took my off one medicine for nerve pain but I think I should be back on it because I've been feeling pain ever since. Nothing new otherwise. Pendulums help.   PERTINENT HISTORY: 07/25/2022 07/25/2022  ASSESSMENT:       Left shoulder pain secondary to impingement versus partial rotator cuff tear.         PLAN:      I recommended Ms. Mccullers that we try her on a Medrol Dosepak. She is diabetic and will monitor closely her blood sugars. If her blood sugars elevate significantly, she will stop the steroid medication. I am also referring her back to physical therapy as I think this is important to getting her range of motion and strength back. She will follow up with me in 6 to 8 weeks. If she has persistent pain, she may be considered for an intra-articular injection of corticosteroid versus considering arthroscopic evaluation of the left shoulder. Patient understood and agreed with this plan.     PAIN:  Are you having pain? Yes: NPRS scale: 7/10; can get to 9/10 at worst, never gets less than 7/10 Pain location: L shoulder radiating to elbow   Pain description: throbbing, aching Aggravating factors: moving arm too much like for housework, dressing, sleeping hurts  Relieving factors:  propping/supporting arm, hanging arm down like for pendulums    PRECAUTIONS: None  WEIGHT BEARING RESTRICTIONS: No  FALLS:  Has patient fallen in last 6 months? Yes. 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   FINDINGS: No evidence of fracture or dislocation. Mild degenerative changes of the acromioclavicular joint. Soft tissues are unremarkable.   IMPRESSION: No acute osseous abnormality of the left shoulder or humerus.    PATIENT SURVEYS:  FOTO 36  COGNITION: Overall cognitive status: Within functional limits for tasks assessed     SENSATION: Not tested  POSTURE: Forward head, rounded shoulders, decreased lumbar and thoracic lordosis   UPPER EXTREMITY ROM:   Active ROM Right eval Left eval  Shoulder flexion 130 85 pain limited   Shoulder abduction  153 35 pain limited   Shoulder extension     Shoulder adduction    Shoulder internal rotation FIR L5  FIR mid right glute  Shoulder external rotation FER C7  FER to occiput   Elbow flexion    Elbow extension    Wrist flexion    Wrist extension    Wrist ulnar deviation    Wrist radial deviation    Wrist pronation    Wrist supination    (Blank rows = not tested)  Cervical ROM:  flexion and extension WNL, L lateral flexion moderate limitation pain limited, R lateral flexion mild limitation; rotation B mild limitation   Thoracic ROM:  flexion OK, severe  limitation thoracic extension, moderate limitation B lateral flexion and rotation   UPPER EXTREMITY MMT:  MMT Right eval Left eval  Shoulder flexion 4+ 3 pain limited   Shoulder extension 4 3- pain limited   Shoulder abduction 4+ 2 pain limited   Shoulder adduction    Shoulder internal rotation  DNT known 50% cuff tear   Shoulder external rotation  DNT known 50% cuff tear   Middle trapezius    Lower trapezius    Elbow flexion 4 4  Elbow extension 4 3 pain limited   Wrist flexion    Wrist extension    Wrist ulnar deviation    Wrist radial deviation    Wrist pronation    Wrist supination    Grip strength (lbs)    (Blank rows = not tested)  SHOULDER SPECIAL TESTS: Impingement tests:  SLAP lesions:  Instability tests:  Rotator cuff assessment:  Biceps assessment:   JOINT MOBILITY TESTING:    PALPATION:   B upper traps with multiple trigger points and TTP, L rotator cuff TTP, L anterior/posterior/especially middle delt very TTP, L biceps TTP    TODAY'S TREATMENT:                                                                                                                                         DATE:   09/01/22  TherEx  UBE L1 x6 minutes (3 forward/3 backward) Supine AAROM flexion x15 with cane Supine AAROM ABD x10 with cane unable to tolerate with L UE in ER, performed limited ROM with UE in IR but still very pain limited Isometrics: supine shoulder extensions onto mat table x12 with 5 second holds, standing shoulder flexion into doorframe with towel x12 with 5 second holds, standing shoulder ABD into doorframe with towel x12 with 5 second holds, shoulder ER into doorframe with towel x12 with 5 second holds effort to tolerance      Manual  PROM all directions to tolerance, mixed with gentle grade II GH jobs + oscillation and gentle distraction; only able to tolerate ABD to 80*  with UE internally rotated not externally rotated, also could not tolerate ER today at  all   Eval   Objective measures + education  TherEx  Supine shoulder flexion x10 with Cane AAROM Seated shoulder flexion stretch at table x10 Pendulums AP and lateral      PATIENT EDUCATION: Education details: exercise form and purpose  Person educated: Patient Education method: Programmer, multimediaxplanation, Facilities managerDemonstration, and Handouts Education comprehension: verbalized understanding, returned demonstration, and needs further education  HOME EXERCISE PROGRAM:  Access Code: [email or web address] URL: [email or web address] Date: 08/25/2022 Prepared by: Nedra HaiKristen Silvina Hackleman  Exercises - Seated Shoulder Flexion Table Top Stretch  - 2 x daily - 7 x weekly - 1 sets - 10 reps - 5 hold - Seated Shoulder Abduction Towel Slide at Table Top  - 2 x daily - 7 x weekly - 1 sets - 10 reps - 5 hold - Flexion-Extension Shoulder Pendulum with Table Support  - 3 x daily -  7 x weekly - 1 sets - 20 reps - Horizontal Shoulder Pendulum with Table Support  - 3 x daily - 7 x weekly - 1 sets - 20 reps  ASSESSMENT:  CLINICAL IMPRESSION:  Nimco arrives today doing OK, no major changes. Warmed up on UBE then performed therapeutic exercises and manual interventions as tolerated. Let pain be our guide today. Will continue to progress as able to continue to feel PT prognosis is fair. Sees MD this Friday, will await updates from medical team in terms of POC.   OBJECTIVE IMPAIRMENTS: decreased ROM, decreased strength, hypomobility, increased fascial restrictions, increased muscle spasms, impaired UE functional use, improper body mechanics, postural dysfunction, and pain.   ACTIVITY LIMITATIONS: carrying, lifting, transfers, bed mobility, bathing, toileting, dressing, and reach over head  PARTICIPATION LIMITATIONS: cleaning, laundry, driving, shopping, community activity, and occupation  PERSONAL FACTORS: Age, Behavior pattern, Fitness, Past/current experiences, Social background, and Time since onset of  injury/illness/exacerbation are also affecting patient's functional outcome.   REHAB POTENTIAL: Fair limited results from PT earlier this year, new 50% tear   CLINICAL DECISION MAKING: Stable/uncomplicated  EVALUATION COMPLEXITY: Moderate   GOALS: Goals reviewed with patient? Yes  SHORT TERM GOALS: Target date: 09/15/2022    Will be compliant with appropriate progressive HEP  Baseline: Goal status: INITIAL  2.  L shoulder ROM to be equal to that of R  Baseline:  Goal status: INITIAL  3.  Pain in L shoulder to be no more than 5/10 Baseline:  Goal status: INITIAL  4.  Thoracic ROM limitations to have resolved  Baseline:  Goal status: INITIAL   LONG TERM GOALS: Target date: 10/06/2022    MMT to improve by 1 grade in all weak groups  Baseline:  Goal status: INITIAL  2.  Pain in L shoulder to be no more than 3/10 Baseline:  Goal status: INITIAL  3.  Will be able to perform all work duties without increase in pain  Baseline:  Goal status: INITIAL  4.  Will be able to perform all dressing/bathing tasks without increase in pain  Baseline:  Goal status: INITIAL   PLAN:  PT FREQUENCY: 2x/week  PT DURATION: 6 weeks  PLANNED INTERVENTIONS: Therapeutic exercises, Therapeutic activity, Neuromuscular re-education, Balance training, Gait training, Patient/Family education, Self Care, Joint mobilization, Aquatic Therapy, Dry Needling, Electrical stimulation, Cryotherapy, Moist heat, Taping, Ultrasound, Ionotophoresis 4mg /ml Dexamethasone, Manual therapy, and Re-evaluation  PLAN FOR NEXT SESSION: gentle progression as tolerated, activities as tolerated within pain limitations; add isometrics to HEP if she felt ok the day after last PT session    Nikia Levels U PT DPT PN2 09/01/2022, 10:13 AM

## 2022-09-02 ENCOUNTER — Other Ambulatory Visit (HOSPITAL_BASED_OUTPATIENT_CLINIC_OR_DEPARTMENT_OTHER): Payer: Self-pay

## 2022-09-02 MED FILL — Atorvastatin Calcium Tab 20 MG (Base Equivalent): ORAL | 30 days supply | Qty: 30 | Fill #0 | Status: AC

## 2022-09-03 ENCOUNTER — Other Ambulatory Visit (HOSPITAL_BASED_OUTPATIENT_CLINIC_OR_DEPARTMENT_OTHER): Payer: Self-pay

## 2022-09-03 MED FILL — Amlodipine Besylate Tab 5 MG (Base Equivalent): ORAL | 30 days supply | Qty: 30 | Fill #0 | Status: AC

## 2022-09-04 NOTE — Progress Notes (Deleted)
   There were no vitals taken for this visit.   Subjective:    Patient ID: Cindy Carson, female    DOB: 1971/02/18, 51 y.o.   MRN: [id number]  HPI: Cindy Carson is a 51 y.o. female  No chief complaint on file.  DEPRESSION Mood status: worse Reports she has had 2 funerals and "I don't like my husband anymore and I feel stuck living in [place]"   Satisfied with current treatment?: no Symptom severity: severe  Duration of current treatment : chronic Side effects: no Medication compliance: good compliance Psychotherapy/counseling: no in the past Previous psychiatric medications: cymbalta unsure if she has been on other medications in the past  Depressed mood: yes Anxious mood: yes Anhedonia: yes Significant weight loss or gain: no Insomnia: yes hard to stay asleep Fatigue: yes Feelings of worthlessness or guilt: yes Impaired concentration/indecisiveness: yes reports concerns for memory troubles  Suicidal ideations: no Hopelessness: yes Crying spells: no    07/25/2022   10:45 AM 01/28/2022    4:09 PM 07/11/2021    8:23 AM 12/09/2018    9:21 AM  Depression screen PHQ 2/9  Decreased Interest 3 3 2  0  Down, Depressed, Hopeless 2 3 1  0  PHQ - 2 Score 5 6 3  0  Altered sleeping 3 3 3    Tired, decreased energy 3 3 3    Change in appetite 3 3 2    Feeling bad or failure about yourself  2 3 2    Trouble concentrating 2 2 2    Moving slowly or fidgety/restless 2 1 0   Suicidal thoughts 0 0 0   PHQ-9 Score 20 21 15    Difficult doing work/chores Somewhat difficult Very difficult Somewhat difficult    Relevant past medical, surgical, family and social history reviewed and updated as indicated. Interim medical history since our last visit reviewed. Allergies and medications reviewed and updated.  Review of Systems  Per HPI unless specifically indicated above     Objective:    There were no vitals taken for this visit.  Wt Readings from Last 3 Encounters:  07/25/22 188 lb 8 oz (85.5 kg)   04/02/22 180 lb (81.6 kg)  03/19/22 186 lb 3.2 oz (84.5 kg)    Physical Exam  Results for orders placed or performed in visit on 07/25/22  Bayer DCA Hb A1c Waived (STAT)  Result Value Ref Range   HB A1C (BAYER DCA - WAIVED) 8.3 (H) 4.8 - 5.6 %  Microalbumin, Urine Waived (STAT)  Result Value Ref Range   Microalb, Ur Waived 30 (H) 0 - 19 mg/L   Creatinine, Urine Waived 200 10 - 300 mg/dL   Microalb/Creat Ratio <30 <30 mg/g      Assessment & Plan:   Problem List Items Addressed This Visit       Other   Depression, recurrent (HCC) - Primary     Follow up plan: No follow-ups on file.

## 2022-09-05 ENCOUNTER — Ambulatory Visit: Payer: 59 | Admitting: Nurse Practitioner

## 2022-09-05 DIAGNOSIS — F339 Major depressive disorder, recurrent, unspecified: Secondary | ICD-10-CM

## 2022-09-08 ENCOUNTER — Ambulatory Visit (HOSPITAL_BASED_OUTPATIENT_CLINIC_OR_DEPARTMENT_OTHER): Payer: 59 | Admitting: Physical Therapy

## 2022-09-08 ENCOUNTER — Encounter (HOSPITAL_BASED_OUTPATIENT_CLINIC_OR_DEPARTMENT_OTHER): Payer: Self-pay | Admitting: Physical Therapy

## 2022-09-08 DIAGNOSIS — M6281 Muscle weakness (generalized): Secondary | ICD-10-CM

## 2022-09-08 DIAGNOSIS — M25512 Pain in left shoulder: Secondary | ICD-10-CM | POA: Diagnosis not present

## 2022-09-08 DIAGNOSIS — G8929 Other chronic pain: Secondary | ICD-10-CM

## 2022-09-08 NOTE — Therapy (Signed)
OUTPATIENT PHYSICAL THERAPY SHOULDER TREATMENT   Patient Name: Cindy Carson MRN: [id number] DOB:[birth year], 51 y.o., female Today's Date: 09/08/2022  END OF SESSION:  PT End of Session - 09/08/22 0954     Visit Number 3    Number of Visits 13    Date for PT Re-Evaluation 10/06/22    Authorization Type UMR    Authorization Time Period 08/25/22 to 10/20/22    PT Start Time 0935   pt late   PT Stop Time 1015    PT Time Calculation (min) 40 min    Activity Tolerance Patient tolerated treatment well    Behavior During Therapy [facility] for tasks assessed/performed               Past Medical History:  Diagnosis Date   Anemia 12/09/2018   Chronic low back pain    Chronic neck pain    Diabetes type 2, uncontrolled    diagnosed in 2019   Hyperlipidemia 08/01/2021   Hypertension complicating diabetes ([place])    Neck pain 07/11/2021   Obesity 12/09/2018   OSA (obstructive sleep apnea)    sleep study done in [place] in 2019 per pt   Rash 01/28/2022   Screening for colon cancer 08/01/2021   Past Surgical History:  Procedure Laterality Date   COLONOSCOPY     COLONOSCOPY N/A 09/03/2021   Procedure: COLONOSCOPY;  Surgeon: Lucilla Lame, MD;  Location: [facility] ENDOSCOPY;  Service: Endoscopy;  Laterality: N/A;   TUBAL LIGATION     Patient Active Problem List   Diagnosis Date Noted   Type 2 diabetes mellitus with hyperglycemia, without long-term current use of insulin ([place]) 07/25/2022   Depression, recurrent ([place]) 07/25/2022   Encounter for screening for malignant neoplasm of breast 07/11/2021   Hyperlipidemia associated with type 2 diabetes mellitus ([place]) 07/11/2021   Personal history of noncompliance with medical treatment, presenting hazards to health 02/15/2019   History of syphilis 12/09/2018   Bursitis of hip 12/09/2018   Anemia 12/09/2018   Obesity 12/09/2018   OSA (obstructive sleep apnea)    HTN (hypertension)    Hypertension complicating diabetes ([place])    Chronic low back  pain    Chronic neck pain    Diabetes type 2, uncontrolled     PCP: Jon Billings NP   REFERRING PROVIDER: Thornton Park, MD  REFERRING DIAG: [phone] (ICD-10-CM) - Strain of unspecified muscle, fascia and tendon at shoulder and upper arm level, left arm, subsequent encounte  THERAPY DIAG:  Chronic left shoulder pain  Muscle weakness (generalized)  Rationale for Evaluation and Treatment: Rehabilitation  ONSET DATE: 07/31/2022  SUBJECTIVE:  SUBJECTIVE STATEMENT:  I'm OK. Shoulder is moving better. Nothing new.   PERTINENT HISTORY: 07/25/2022 07/25/2022  ASSESSMENT:       Left shoulder pain secondary to impingement versus partial rotator cuff tear.         PLAN:      I recommended [name] that we try her on a Medrol Dosepak. She is diabetic and will monitor closely her blood sugars. If her blood sugars elevate significantly, she will stop the steroid medication. I am also referring her back to physical therapy as I think this is important to getting her range of motion and strength back. She will follow up with me in 6 to 8 weeks. If she has persistent pain, she may be considered for an intra-articular injection of corticosteroid versus considering arthroscopic evaluation of the left shoulder. Patient understood and agreed with this plan.     PAIN:  Are you having pain? Yes: NPRS scale: 6/10; can get to 9/10 at worst, never gets less than 7/10 Pain location: L shoulder radiating to elbow   Pain description: throbbing, aching Aggravating factors: moving arm too much like for housework, dressing, sleeping hurts  Relieving factors: propping/supporting arm, hanging arm down like for pendulums    PRECAUTIONS: None  WEIGHT BEARING RESTRICTIONS: No  FALLS:  Has patient fallen  in last 6 months? Yes. 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, registration   PLOF: Independent, Independent with basic ADLs, Independent with gait, and Independent with transfers  PATIENT GOALS:get arm feeling better if possible  NEXT MD VISIT:   OBJECTIVE:   DIAGNOSTIC FINDINGS:  EXAM: LEFT HUMERUS - 2 VIEW; LEFT SHOULDER - 3 VIEW   COMPARISON:  None Available.   FINDINGS: No evidence of fracture or dislocation. Mild degenerative changes of the acromioclavicular joint. Soft tissues are unremarkable.   IMPRESSION: No acute osseous abnormality of the left shoulder or humerus.    PATIENT SURVEYS:  FOTO 36  COGNITION: Overall cognitive status: Within functional limits for tasks assessed     SENSATION: Not tested  POSTURE: Forward head, rounded shoulders, decreased lumbar and thoracic lordosis   UPPER EXTREMITY ROM:   Active ROM Right eval Left eval  Shoulder flexion 130 85 pain limited   Shoulder abduction  153 35 pain limited   Shoulder extension     Shoulder adduction    Shoulder internal rotation FIR L5  FIR mid right glute  Shoulder external rotation FER C7  FER to occiput   Elbow flexion    Elbow extension    Wrist flexion    Wrist extension    Wrist ulnar deviation    Wrist radial deviation    Wrist pronation    Wrist supination    (Blank rows = not tested)  Cervical ROM:  flexion and extension WNL, L lateral flexion moderate limitation pain limited, R lateral flexion mild limitation; rotation B mild limitation   Thoracic ROM:  flexion OK, severe limitation thoracic extension, moderate limitation B lateral flexion and rotation   UPPER EXTREMITY MMT:  MMT Right eval Left eval  Shoulder  flexion 4+ 3 pain limited   Shoulder extension 4 3- pain limited   Shoulder abduction 4+ 2 pain limited   Shoulder adduction  Shoulder internal rotation  DNT known 50% cuff tear   Shoulder external rotation  DNT known 50% cuff tear   Middle trapezius    Lower trapezius    Elbow flexion 4 4  Elbow extension 4 3 pain limited   Wrist flexion    Wrist extension    Wrist ulnar deviation    Wrist radial deviation    Wrist pronation    Wrist supination    Grip strength (lbs)    (Blank rows = not tested)  SHOULDER SPECIAL TESTS: Impingement tests:  SLAP lesions:  Instability tests:  Rotator cuff assessment:  Biceps assessment:   JOINT MOBILITY TESTING:    PALPATION:   B upper traps with multiple trigger points and TTP, L rotator cuff TTP, L anterior/posterior/especially middle delt very TTP, L biceps TTP    TODAY'S TREATMENT:                                                                                                                                         DATE:   09/08/22  TherEx  UBE L1 x6 min (3 min forward/3 min backward) AAROM flexion x15 with cane to about 120-13* AAROM ABD x10 with cane limited to about 50* due to pain AAROM ER with cane x15 supine at about 20* ABD LUE flexion AROM x12 0# supine LUE ER with sheet tucked under elbow seated 0# x12 Isometrics: supine shoulder extensions onto mat table x10 with 3 second holds, standing shoulder flexion into doorframe with towel x10 with 3 second holds, standing shoulder ABD into doorframe with towel x10 with 3 second holds, shoulder ER into doorframe with towel x10 with 3 second holds effort to tolerance       Manual  PROM all directions to tolerance,gentle oscillation and cues to avoid/reduce guarding- flexion about 130*, ABD about 110*, ER about 30* with 10* ABD     09/01/22  TherEx  UBE L1 x6 minutes (3 forward/3 backward) Supine AAROM flexion x15 with cane Supine AAROM ABD x10 with cane unable to  tolerate with L UE in ER, performed limited ROM with UE in IR but still very pain limited Isometrics: supine shoulder extensions onto mat table x12 with 5 second holds, standing shoulder flexion into doorframe with towel x12 with 5 second holds, standing shoulder ABD into doorframe with towel x12 with 5 second holds, shoulder ER into doorframe with towel x12 with 5 second holds effort to tolerance      Manual  PROM all directions to tolerance, mixed with gentle grade II [place] jobs + oscillation and gentle distraction; only able to tolerate ABD to 80*  with UE internally rotated not externally rotated, also could not tolerate ER today at all   Eval   Objective measures + education  TherEx  Supine shoulder flexion x10 with Cane AAROM Seated shoulder flexion stretch at table x10 Pendulums AP and lateral  PATIENT EDUCATION: Education details: exercise form and purpose  Person educated: Patient Education method: Explanation, Demonstration, and Handouts Education comprehension: verbalized understanding, returned demonstration, and needs further education  HOME EXERCISE PROGRAM:  Access Code: [email or web address] URL: [email or web address] Date: 09/08/2022 Prepared by: Nedra Hai  Exercises - Seated Shoulder Flexion Table Top Stretch  - 2 x daily - 7 x weekly - 1 sets - 10 reps - 5 hold - Seated Shoulder Abduction Towel Slide at Table Top  - 2 x daily - 7 x weekly - 1 sets - 10 reps - 5 hold - Flexion-Extension Shoulder Pendulum with Table Support  - 3 x daily - 7 x weekly - 1 sets - 20 reps - Horizontal Shoulder Pendulum with Table Support  - 3 x daily - 7 x weekly - 1 sets - 20 reps - Isometric Shoulder Abduction at Wall  - 1 x daily - 7 x weekly - 3 sets - 10 reps - Isometric Shoulder Extension at Wall  - 1 x daily - 7 x weekly - 3 sets - 10 reps - Isometric Shoulder External Rotation at Wall  - 1 x daily - 7 x weekly - 3 sets - 10 reps  ASSESSMENT:  CLINICAL  IMPRESSION:  Arynn arrives today doing OK, tells me shoulder is feeling a bit better. Warmed up on the UBE then proceeded with functional exercise as able. Able to tolerate more today, but I am still concerned she may need surgical intervention- she is her ortho next week, will await their opinion.   OBJECTIVE IMPAIRMENTS: decreased ROM, decreased strength, hypomobility, increased fascial restrictions, increased muscle spasms, impaired UE functional use, improper body mechanics, postural dysfunction, and pain.   ACTIVITY LIMITATIONS: carrying, lifting, transfers, bed mobility, bathing, toileting, dressing, and reach over head  PARTICIPATION LIMITATIONS: cleaning, laundry, driving, shopping, community activity, and occupation  PERSONAL FACTORS: Age, Behavior pattern, Fitness, Past/current experiences, Social background, and Time since onset of injury/illness/exacerbation are also affecting patient's functional outcome.   REHAB POTENTIAL: Fair limited results from PT earlier this year, new 50% tear   CLINICAL DECISION MAKING: Stable/uncomplicated  EVALUATION COMPLEXITY: Moderate   GOALS: Goals reviewed with patient? Yes  SHORT TERM GOALS: Target date: 09/15/2022    Will be compliant with appropriate progressive HEP  Baseline: Goal status: INITIAL  2.  L shoulder ROM to be equal to that of R  Baseline:  Goal status: INITIAL  3.  Pain in L shoulder to be no more than 5/10 Baseline:  Goal status: INITIAL  4.  Thoracic ROM limitations to have resolved  Baseline:  Goal status: INITIAL   LONG TERM GOALS: Target date: 10/06/2022    MMT to improve by 1 grade in all weak groups  Baseline:  Goal status: INITIAL  2.  Pain in L shoulder to be no more than 3/10 Baseline:  Goal status: INITIAL  3.  Will be able to perform all work duties without increase in pain  Baseline:  Goal status: INITIAL  4.  Will be able to perform all dressing/bathing tasks without increase in pain   Baseline:  Goal status: INITIAL   PLAN:  PT FREQUENCY: 2x/week  PT DURATION: 6 weeks  PLANNED INTERVENTIONS: Therapeutic exercises, Therapeutic activity, Neuromuscular re-education, Balance training, Gait training, Patient/Family education, Self Care, Joint mobilization, Aquatic Therapy, Dry Needling, Electrical stimulation, Cryotherapy, Moist heat, Taping, Ultrasound, Ionotophoresis 4mg /ml Dexamethasone, Manual therapy, and Re-evaluation  PLAN FOR NEXT SESSION: gentle progression as tolerated, activities as tolerated within pain limitations; add isometrics  to HEP if she felt ok the day after last PT session    Walfred Bettendorf U PT DPT PN2 09/08/2022, 10:15 AM

## 2022-09-11 ENCOUNTER — Ambulatory Visit (HOSPITAL_BASED_OUTPATIENT_CLINIC_OR_DEPARTMENT_OTHER): Payer: 59 | Admitting: Physical Therapy

## 2022-09-11 ENCOUNTER — Encounter (HOSPITAL_BASED_OUTPATIENT_CLINIC_OR_DEPARTMENT_OTHER): Payer: Self-pay | Admitting: Physical Therapy

## 2022-09-11 ENCOUNTER — Other Ambulatory Visit: Payer: Self-pay | Admitting: Physician Assistant

## 2022-09-11 DIAGNOSIS — G8929 Other chronic pain: Secondary | ICD-10-CM

## 2022-09-11 DIAGNOSIS — F339 Major depressive disorder, recurrent, unspecified: Secondary | ICD-10-CM

## 2022-09-11 DIAGNOSIS — M6281 Muscle weakness (generalized): Secondary | ICD-10-CM

## 2022-09-11 DIAGNOSIS — M25512 Pain in left shoulder: Secondary | ICD-10-CM | POA: Diagnosis not present

## 2022-09-11 MED ORDER — DULOXETINE HCL 30 MG PO CPEP
30.0000 mg | ORAL_CAPSULE | Freq: Every day | ORAL | 1 refills | Status: DC
  Filled 2022-09-17 – 2022-09-18 (×2): qty 30, 30d supply, fill #0

## 2022-09-11 NOTE — Progress Notes (Signed)
Discontinuing Venlafaxine as patient reports she did not like the way it made her feel She reports she would like to continue taking Cymbalta 30 mg instead- refill provided and patient instructed to follow up at her next visit.

## 2022-09-11 NOTE — Therapy (Signed)
OUTPATIENT PHYSICAL THERAPY SHOULDER TREATMENT   Patient Name: Cindy Carson MRN: [id number] DOB:15-Oct-1970, 51 y.o., female Today's Date: 09/08/2022  END OF SESSION:  PT End of Session - 09/08/22 0954     Visit Number 3    Number of Visits 13    Date for PT Re-Evaluation 10/06/22    Authorization Type UMR    Authorization Time Period 08/25/22 to 10/20/22    PT Start Time 0935   pt late   PT Stop Time 1015    PT Time Calculation (min) 40 min    Activity Tolerance Patient tolerated treatment well    Behavior During Therapy [facility] for tasks assessed/performed               Past Medical History:  Diagnosis Date   Anemia 12/09/2018   Chronic low back pain    Chronic neck pain    Diabetes type 2, uncontrolled    diagnosed in 2019   Hyperlipidemia 08/01/2021   Hypertension complicating diabetes ([place])    Neck pain 07/11/2021   Obesity 12/09/2018   OSA (obstructive sleep apnea)    sleep study done in [place] in 2019 per pt   Rash 01/28/2022   Screening for colon cancer 08/01/2021   Past Surgical History:  Procedure Laterality Date   COLONOSCOPY     COLONOSCOPY N/A 09/03/2021   Procedure: COLONOSCOPY;  Surgeon: Lucilla Lame, MD;  Location: [facility] ENDOSCOPY;  Service: Endoscopy;  Laterality: N/A;   TUBAL LIGATION     Patient Active Problem List   Diagnosis Date Noted   Type 2 diabetes mellitus with hyperglycemia, without long-term current use of insulin ([place]) 07/25/2022   Depression, recurrent ([place]) 07/25/2022   Encounter for screening for malignant neoplasm of breast 07/11/2021   Hyperlipidemia associated with type 2 diabetes mellitus ([place]) 07/11/2021   Personal history of noncompliance with medical treatment, presenting hazards to health 02/15/2019   History of syphilis 12/09/2018   Bursitis of hip 12/09/2018   Anemia 12/09/2018   Obesity 12/09/2018   OSA (obstructive sleep apnea)    HTN (hypertension)    Hypertension complicating diabetes ([place])    Chronic low back  pain    Chronic neck pain    Diabetes type 2, uncontrolled     PCP: Jon Billings NP   REFERRING PROVIDER: Thornton Park, MD  REFERRING DIAG: [phone] (ICD-10-CM) - Strain of unspecified muscle, fascia and tendon at shoulder and upper arm level, left arm, subsequent encounte  THERAPY DIAG:  Chronic left shoulder pain  Muscle weakness (generalized)  Rationale for Evaluation and Treatment: Rehabilitation  ONSET DATE: 07/31/2022  SUBJECTIVE:  SUBJECTIVE STATEMENT:  The patient reports her pain today is about a 6/10. She has the most pain when she sleeps at night. She feels like it is about the same.   PERTINENT HISTORY: 07/25/2022 07/25/2022  ASSESSMENT:       Left shoulder pain secondary to impingement versus partial rotator cuff tear.         PLAN:      I recommended Ms. Schrier that we try her on a Medrol Dosepak. She is diabetic and will monitor closely her blood sugars. If her blood sugars elevate significantly, she will stop the steroid medication. I am also referring her back to physical therapy as I think this is important to getting her range of motion and strength back. She will follow up with me in 6 to 8 weeks. If she has persistent pain, she may be considered for an intra-articular injection of corticosteroid versus considering arthroscopic evaluation of the left shoulder. Patient understood and agreed with this plan.     PAIN:  Are you having pain? Yes: NPRS scale: 6/10; can get to 9/10 at worst, never gets less than 7/10 12/21 Pain location: L shoulder radiating to elbow   Pain description: throbbing, aching Aggravating factors: moving arm too much like for housework, dressing, sleeping hurts  Relieving factors: propping/supporting arm, hanging arm down like for  pendulums    PRECAUTIONS: None  WEIGHT BEARING RESTRICTIONS: No  FALLS:  Has patient fallen in last 6 months? Yes. 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, registration   PLOF: Independent, Independent with basic ADLs, Independent with gait, and Independent with transfers  PATIENT GOALS:get arm feeling better if possible  NEXT MD VISIT:   OBJECTIVE:   DIAGNOSTIC FINDINGS:  EXAM: LEFT HUMERUS - 2 VIEW; LEFT SHOULDER - 3 VIEW   COMPARISON:  None Available.   FINDINGS: No evidence of fracture or dislocation. Mild degenerative changes of the acromioclavicular joint. Soft tissues are unremarkable.   IMPRESSION: No acute osseous abnormality of the left shoulder or humerus.    PATIENT SURVEYS:  FOTO 36  COGNITION: Overall cognitive status: Within functional limits for tasks assessed     SENSATION: Not tested  POSTURE: Forward head, rounded shoulders, decreased lumbar and thoracic lordosis   UPPER EXTREMITY ROM:   Active ROM Right eval Left eval  Shoulder flexion 130 85 pain limited   Shoulder abduction  153 35 pain limited   Shoulder extension     Shoulder adduction    Shoulder internal rotation FIR L5  FIR mid right glute  Shoulder external rotation FER C7  FER to occiput   Elbow flexion    Elbow extension    Wrist flexion    Wrist extension    Wrist ulnar deviation    Wrist radial deviation    Wrist pronation    Wrist supination    (Blank rows = not tested)  Cervical ROM:  flexion and extension WNL, L lateral flexion moderate limitation pain limited, R lateral flexion mild limitation; rotation B mild limitation   Thoracic ROM:  flexion OK, severe limitation thoracic extension, moderate limitation B  lateral flexion and rotation   UPPER EXTREMITY MMT:  MMT Right eval Left eval  Shoulder flexion 4+ 3 pain limited  Shoulder extension 4 3- pain limited   Shoulder abduction 4+ 2 pain limited   Shoulder adduction    Shoulder internal rotation  DNT known 50% cuff tear   Shoulder external rotation  DNT known 50% cuff tear   Middle trapezius    Lower trapezius    Elbow flexion 4 4  Elbow extension 4 3 pain limited   Wrist flexion    Wrist extension    Wrist ulnar deviation    Wrist radial deviation    Wrist pronation    Wrist supination    Grip strength (lbs)    (Blank rows = not tested)  SHOULDER SPECIAL TESTS: Impingement tests:  SLAP lesions:  Instability tests:  Rotator cuff assessment:  Biceps assessment:   JOINT MOBILITY TESTING:    PALPATION:   B upper traps with multiple trigger points and TTP, L rotator cuff TTP, L anterior/posterior/especially middle delt very TTP, L biceps TTP    TODAY'S TREATMENT:                                                                                                                                         DATE:    12/21 Manual: inferior and posterior glides grade II and III  UBE L1 x6 min (2 min forward/2 min backward) AAROM flexion with a wand x7. Patient had significant pain despite adjustments in range  Side lying er significant pain x10 without weight   Row x10 yellow paitnet reported significant pain going forward  Shoulder extension yellow significant pain  At this time her exercises were halted 2nd to pain with all activity   09/08/22  TherEx  UBE L1 x6 min (3 min forward/3 min backward) AAROM flexion x15 with cane to about 120-13* AAROM ABD x10 with cane limited to about 50* due to pain AAROM ER with cane x15 supine at about 20* ABD LUE flexion AROM x12 0# supine LUE ER with sheet tucked under elbow seated 0# x12 Isometrics: supine shoulder extensions onto mat table x10 with 3 second holds, standing shoulder  flexion into doorframe with towel x10 with 3 second holds, standing shoulder ABD into doorframe with towel x10 with 3 second holds, shoulder ER into doorframe with towel x10 with 3 second holds effort to tolerance       Manual  PROM all directions to tolerance,gentle oscillation and cues to avoid/reduce guarding- flexion about 130*, ABD about 110*, ER about 30* with 10* ABD      09/01/22  TherEx  UBE L1 x6 minutes (3 forward/3 backward) Supine AAROM flexion x15 with cane Supine AAROM ABD x10 with cane unable to tolerate with L UE in ER, performed limited ROM with UE in IR but still very pain limited Isometrics: supine shoulder extensions onto mat table x12 with 5 second holds, standing shoulder flexion into doorframe with towel x12 with 5 second holds, standing shoulder ABD into doorframe  with towel x12 with 5 second holds, shoulder ER into doorframe with towel x12 with 5 second holds effort to tolerance      Manual  PROM all directions to tolerance, mixed with gentle grade II GH jobs + oscillation and gentle distraction; only able to tolerate ABD to 80*  with UE internally rotated not externally rotated, also could not tolerate ER today at all   Eval   Objective measures + education  TherEx  Supine shoulder flexion x10 with Cane AAROM Seated shoulder flexion stretch at table x10 Pendulums AP and lateral      PATIENT EDUCATION: Education details: exercise form and purpose  Person educated: Patient Education method: Programmer, multimedia, Facilities manager, and Handouts Education comprehension: verbalized understanding, returned demonstration, and needs further education  HOME EXERCISE PROGRAM:  Access Code: [email or web address] URL: [email or web address] Date: 09/08/2022 Prepared by: Nedra Hai  Exercises - Seated Shoulder Flexion Table Top Stretch  - 2 x daily - 7 x weekly - 1 sets - 10 reps - 5 hold - Seated Shoulder Abduction Towel Slide at Table Top  - 2 x daily - 7  x weekly - 1 sets - 10 reps - 5 hold - Flexion-Extension Shoulder Pendulum with Table Support  - 3 x daily - 7 x weekly - 1 sets - 20 reps - Horizontal Shoulder Pendulum with Table Support  - 3 x daily - 7 x weekly - 1 sets - 20 reps - Isometric Shoulder Abduction at Wall  - 1 x daily - 7 x weekly - 3 sets - 10 reps - Isometric Shoulder Extension at Wall  - 1 x daily - 7 x weekly - 3 sets - 10 reps - Isometric Shoulder External Rotation at Wall  - 1 x daily - 7 x weekly - 3 sets - 10 reps  ASSESSMENT:  CLINICAL IMPRESSION: The patient was unable to do a basic wand flexion today. She had significant limitations in movement. She was guarded at first but relaxed with manual therapy. She continues to have significant pain with activity. She is supposed to go back to the MD but she missed her appointment. We focused mainly on manual therapy today. She had limited tolerance to there-ex. She was advised she may benefi from going back to the MD for follow up.   OBJECTIVE IMPAIRMENTS: decreased ROM, decreased strength, hypomobility, increased fascial restrictions, increased muscle spasms, impaired UE functional use, improper body mechanics, postural dysfunction, and pain.   ACTIVITY LIMITATIONS: carrying, lifting, transfers, bed mobility, bathing, toileting, dressing, and reach over head  PARTICIPATION LIMITATIONS: cleaning, laundry, driving, shopping, community activity, and occupation  PERSONAL FACTORS: Age, Behavior pattern, Fitness, Past/current experiences, Social background, and Time since onset of injury/illness/exacerbation are also affecting patient's functional outcome.   REHAB POTENTIAL: Fair limited results from PT earlier this year, new 50% tear   CLINICAL DECISION MAKING: Stable/uncomplicated  EVALUATION COMPLEXITY: Moderate   GOALS: Goals reviewed with patient? Yes  SHORT TERM GOALS: Target date: 09/15/2022    Will be compliant with appropriate progressive HEP  Baseline: Goal  status: INITIAL  2.  L shoulder ROM to be equal to that of R  Baseline:  Goal status: INITIAL  3.  Pain in L shoulder to be no more than 5/10 Baseline:  Goal status: INITIAL  4.  Thoracic ROM limitations to have resolved  Baseline:  Goal status: INITIAL   LONG TERM GOALS: Target date: 10/06/2022    MMT to improve by 1 grade in all weak groups  Baseline:  Goal status: INITIAL  2.  Pain in L shoulder to be no more than 3/10 Baseline:  Goal status: INITIAL  3.  Will be able to perform all work duties without increase in pain  Baseline:  Goal status: INITIAL  4.  Will be able to perform all dressing/bathing tasks without increase in pain  Baseline:  Goal status: INITIAL   PLAN:  PT FREQUENCY: 2x/week  PT DURATION: 6 weeks  PLANNED INTERVENTIONS: Therapeutic exercises, Therapeutic activity, Neuromuscular re-education, Balance training, Gait training, Patient/Family education, Self Care, Joint mobilization, Aquatic Therapy, Dry Needling, Electrical stimulation, Cryotherapy, Moist heat, Taping, Ultrasound, Ionotophoresis 4mg /ml Dexamethasone, Manual therapy, and Re-evaluation  PLAN FOR NEXT SESSION: gentle progression as tolerated, activities as tolerated within pain limitations; add isometrics to HEP if she felt ok the day after last PT session    Kristen U PT DPT PN2 09/08/2022, 10:15 AM

## 2022-09-16 ENCOUNTER — Ambulatory Visit
Admission: RE | Admit: 2022-09-16 | Discharge: 2022-09-16 | Disposition: A | Discharge status: Home / Self Care | Payer: 59 | Source: Ambulatory Visit | Attending: Physician Assistant | Admitting: Physician Assistant

## 2022-09-16 DIAGNOSIS — Z1231 Encounter for screening mammogram for malignant neoplasm of breast: Secondary | ICD-10-CM | POA: Insufficient documentation

## 2022-09-17 ENCOUNTER — Other Ambulatory Visit (HOSPITAL_BASED_OUTPATIENT_CLINIC_OR_DEPARTMENT_OTHER): Payer: Self-pay

## 2022-09-17 ENCOUNTER — Encounter (HOSPITAL_BASED_OUTPATIENT_CLINIC_OR_DEPARTMENT_OTHER): Payer: Self-pay | Admitting: Physical Therapy

## 2022-09-17 ENCOUNTER — Ambulatory Visit (HOSPITAL_BASED_OUTPATIENT_CLINIC_OR_DEPARTMENT_OTHER): Payer: 59 | Admitting: Physical Therapy

## 2022-09-17 ENCOUNTER — Other Ambulatory Visit (HOSPITAL_COMMUNITY): Payer: Self-pay

## 2022-09-17 DIAGNOSIS — M6281 Muscle weakness (generalized): Secondary | ICD-10-CM

## 2022-09-17 DIAGNOSIS — M25512 Pain in left shoulder: Secondary | ICD-10-CM | POA: Diagnosis not present

## 2022-09-17 DIAGNOSIS — G8929 Other chronic pain: Secondary | ICD-10-CM

## 2022-09-17 NOTE — Therapy (Signed)
OUTPATIENT PHYSICAL THERAPY SHOULDER TREATMENT   Patient Name: Cindy Carson MRN: [id number] DOB:[birth year], 51 y.o., female Today's Date: 09/17/2022  END OF SESSION:  PT End of Session - 09/17/22 1029     Visit Number 5    Number of Visits 13    Date for PT Re-Evaluation 10/06/22    Authorization Type UMR    Authorization Time Period 08/25/22 to 10/20/22    PT Start Time 1019    PT Stop Time 1049   ended session early, feel she needs to be seen and cleared by MD prior to continuing with PT- may be more appropriate for surgery/does not seem to be benefiting from PT right now   PT Time Calculation (min) 30 min    Activity Tolerance Patient tolerated treatment well    Behavior During Therapy [facility] for tasks assessed/performed                Past Medical History:  Diagnosis Date   Anemia 12/09/2018   Chronic low back pain    Chronic neck pain    Diabetes type 2, uncontrolled    diagnosed in 2019   Hyperlipidemia 08/01/2021   Hypertension complicating diabetes ([place])    Neck pain 07/11/2021   Obesity 12/09/2018   OSA (obstructive sleep apnea)    sleep study done in [place] in 2019 per pt   Rash 01/28/2022   Screening for colon cancer 08/01/2021   Past Surgical History:  Procedure Laterality Date   COLONOSCOPY     COLONOSCOPY N/A 09/03/2021   Procedure: COLONOSCOPY;  Surgeon: Lucilla Lame, MD;  Location: [facility] ENDOSCOPY;  Service: Endoscopy;  Laterality: N/A;   TUBAL LIGATION     Patient Active Problem List   Diagnosis Date Noted   Type 2 diabetes mellitus with hyperglycemia, without long-term current use of insulin ([place]) 07/25/2022   Depression, recurrent ([place]) 07/25/2022   Encounter for screening for malignant neoplasm of breast 07/11/2021   Hyperlipidemia associated with type 2 diabetes mellitus ([place]) 07/11/2021   Personal history of noncompliance with medical treatment, presenting hazards to health 02/15/2019   History of syphilis 12/09/2018   Bursitis of hip  12/09/2018   Anemia 12/09/2018   Obesity 12/09/2018   OSA (obstructive sleep apnea)    HTN (hypertension)    Hypertension complicating diabetes ([place])    Chronic low back pain    Chronic neck pain    Diabetes type 2, uncontrolled     PCP: Jon Billings NP   REFERRING PROVIDER: Thornton Park, MD  REFERRING DIAG: [phone] (ICD-10-CM) - Strain of unspecified muscle, fascia and tendon at shoulder and upper arm level, left arm, subsequent encounte  THERAPY DIAG:  Chronic left shoulder pain  Muscle weakness (generalized)  Rationale for Evaluation and Treatment: Rehabilitation  ONSET DATE: 07/31/2022  SUBJECTIVE:  SUBJECTIVE STATEMENT:  Things are about the same, I missed the appointment with my other ortho. Cindy Carson told me seeing Dr. Sammuel Hines may be a good idea.   PERTINENT HISTORY: 07/25/2022 07/25/2022  ASSESSMENT:       Left shoulder pain secondary to impingement versus partial rotator cuff tear.         PLAN:      I recommended Cindy Carson that we try her on a Medrol Dosepak. She is diabetic and will monitor closely her blood sugars. If her blood sugars elevate significantly, she will stop the steroid medication. I am also referring her back to physical therapy as I think this is important to getting her range of motion and strength back. She will follow up with me in 6 to 8 weeks. If she has persistent pain, she may be considered for an intra-articular injection of corticosteroid versus considering arthroscopic evaluation of the left shoulder. Patient understood and agreed with this plan.     PAIN:  Are you having pain? Yes: NPRS scale: 6/10; can get to 9/10 at worst, never gets less than 7/10 12/21 Pain location: L shoulder radiating to elbow   Pain description: throbbing,  aching Aggravating factors: moving arm too much like for housework, dressing, sleeping hurts  Relieving factors: propping/supporting arm, hanging arm down like for pendulums    PRECAUTIONS: None  WEIGHT BEARING RESTRICTIONS: No  FALLS:  Has patient fallen in last 6 months? Yes. 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, registration   PLOF: Independent, Independent with basic ADLs, Independent with gait, and Independent with transfers  PATIENT GOALS:get arm feeling better if possible  NEXT MD VISIT:   OBJECTIVE:   DIAGNOSTIC FINDINGS:  EXAM: LEFT HUMERUS - 2 VIEW; LEFT SHOULDER - 3 VIEW   COMPARISON:  None Available.   FINDINGS: No evidence of fracture or dislocation. Mild degenerative changes of the acromioclavicular joint. Soft tissues are unremarkable.   IMPRESSION: No acute osseous abnormality of the left shoulder or humerus.    PATIENT SURVEYS:  FOTO 36  COGNITION: Overall cognitive status: Within functional limits for tasks assessed     SENSATION: Not tested  POSTURE: Forward head, rounded shoulders, decreased lumbar and thoracic lordosis   UPPER EXTREMITY ROM:   Active ROM Right eval Left eval  Shoulder flexion 130 85 pain limited   Shoulder abduction  153 35 pain limited   Shoulder extension     Shoulder adduction    Shoulder internal rotation FIR L5  FIR mid right glute  Shoulder external rotation FER C7  FER to occiput   Elbow flexion    Elbow extension    Wrist flexion    Wrist extension    Wrist ulnar deviation    Wrist radial deviation    Wrist pronation    Wrist supination    (Blank rows = not tested)  Cervical ROM:  flexion and extension WNL, L lateral flexion moderate limitation  pain limited, R lateral flexion mild limitation; rotation B mild limitation   Thoracic ROM:  flexion OK, severe limitation thoracic extension, moderate limitation B lateral flexion and rotation   UPPER EXTREMITY MMT:  MMT Right eval Left eval  Shoulder flexion 4+ 3 pain limited   Shoulder extension  4 3- pain limited   Shoulder abduction 4+ 2 pain limited   Shoulder adduction    Shoulder internal rotation  DNT known 50% cuff tear   Shoulder external rotation  DNT known 50% cuff tear   Middle trapezius    Lower trapezius    Elbow flexion 4 4  Elbow extension 4 3 pain limited   Wrist flexion    Wrist extension    Wrist ulnar deviation    Wrist radial deviation    Wrist pronation    Wrist supination    Grip strength (lbs)    (Blank rows = not tested)  SHOULDER SPECIAL TESTS: Impingement tests:  SLAP lesions:  Instability tests:  Rotator cuff assessment:  Biceps assessment:   JOINT MOBILITY TESTING:    PALPATION:   B upper traps with multiple trigger points and TTP, L rotator cuff TTP, L anterior/posterior/especially middle delt very TTP, L biceps TTP    TODAY'S TREATMENT:                                                                                                                                         DATE:    09/17/22    TherEx  UBE x6 minutes (3 forward/3 backward) L1 Supine L UE flexion with cane x15 3 second holds (pain 8/10 after, rested to let pain come down) SPC bench press x15  ER AAROM x10 seated with SPC (pain to 5/10) Doorway flexion slides x10 for ROM   12/21 Manual: inferior and posterior glides grade II and III  UBE L1 x6 min (2 min forward/2 min backward) AAROM flexion with a wand x7. Patient had significant pain despite adjustments in range  Side lying er significant pain x10 without weight   Row x10 yellow paitnet reported significant pain going forward  Shoulder extension yellow significant pain  At this time her exercises were  halted 2nd to pain with all activity   09/08/22  TherEx  UBE L1 x6 min (3 min forward/3 min backward) AAROM flexion x15 with cane to about 120-13* AAROM ABD x10 with cane limited to about 50* due to pain AAROM ER with cane x15 supine at about 20* ABD LUE flexion AROM x12 0# supine LUE ER with sheet tucked under elbow seated 0# x12 Isometrics: supine shoulder extensions onto mat table x10 with 3 second holds, standing shoulder flexion into doorframe with towel x10 with 3 second holds, standing shoulder ABD into doorframe with towel x10 with 3 second holds, shoulder ER into doorframe with towel x10 with 3 second holds effort to tolerance       Manual  PROM all directions to tolerance,gentle oscillation and cues to avoid/reduce guarding- flexion about 130*, ABD about 110*, ER about 30* with 10* ABD      09/01/22  TherEx  UBE L1 x6 minutes (3 forward/3 backward) Supine AAROM flexion x15 with cane Supine AAROM ABD  x10 with cane unable to tolerate with L UE in ER, performed limited ROM with UE in IR but still very pain limited Isometrics: supine shoulder extensions onto mat table x12 with 5 second holds, standing shoulder flexion into doorframe with towel x12 with 5 second holds, standing shoulder ABD into doorframe with towel x12 with 5 second holds, shoulder ER into doorframe with towel x12 with 5 second holds effort to tolerance      Manual  PROM all directions to tolerance, mixed with gentle grade II GH jobs + oscillation and gentle distraction; only able to tolerate ABD to 80*  with UE internally rotated not externally rotated, also could not tolerate ER today at all   Eval   Objective measures + education  TherEx  Supine shoulder flexion x10 with Cane AAROM Seated shoulder flexion stretch at table x10 Pendulums AP and lateral      PATIENT EDUCATION: Education details: exercise form and purpose, holding PT until MD follow-up as multiple therapists feel surgery is  possibly indicated at this point  Person educated: Patient Education method: Programmer, multimedia, Demonstration, and Handouts Education comprehension: verbalized understanding, returned demonstration, and needs further education  HOME EXERCISE PROGRAM:  Access Code: [email or web address] URL: [email or web address] Date: 09/08/2022 Prepared by: Nedra Hai  Exercises - Seated Shoulder Flexion Table Top Stretch  - 2 x daily - 7 x weekly - 1 sets - 10 reps - 5 hold - Seated Shoulder Abduction Towel Slide at Table Top  - 2 x daily - 7 x weekly - 1 sets - 10 reps - 5 hold - Flexion-Extension Shoulder Pendulum with Table Support  - 3 x daily - 7 x weekly - 1 sets - 20 reps - Horizontal Shoulder Pendulum with Table Support  - 3 x daily - 7 x weekly - 1 sets - 20 reps - Isometric Shoulder Abduction at Wall  - 1 x daily - 7 x weekly - 3 sets - 10 reps - Isometric Shoulder Extension at Wall  - 1 x daily - 7 x weekly - 3 sets - 10 reps - Isometric Shoulder External Rotation at Wall  - 1 x daily - 7 x weekly - 3 sets - 10 reps  ASSESSMENT:  CLINICAL IMPRESSION:  Margaux arrives with no major changes this morning; we did discuss going on hold after PT today- especially as multiple therapists now feel that surgery may be indicated given slow progress and difficulty tolerating exercise. Performed functional exercise as able and tolerated/appropriate. Going on hold for now until after MD visit as I am really questioning if PT is helpful for her at this point/may even have potential to aggravate condition and would like formal opinion from ortho before continuing.   OBJECTIVE IMPAIRMENTS: decreased ROM, decreased strength, hypomobility, increased fascial restrictions, increased muscle spasms, impaired UE functional use, improper body mechanics, postural dysfunction, and pain.   ACTIVITY LIMITATIONS: carrying, lifting, transfers, bed mobility, bathing, toileting, dressing, and reach over head  PARTICIPATION  LIMITATIONS: cleaning, laundry, driving, shopping, community activity, and occupation  PERSONAL FACTORS: Age, Behavior pattern, Fitness, Past/current experiences, Social background, and Time since onset of injury/illness/exacerbation are also affecting patient's functional outcome.   REHAB POTENTIAL: Fair limited results from PT earlier this year, new 50% tear   CLINICAL DECISION MAKING: Stable/uncomplicated  EVALUATION COMPLEXITY: Moderate   GOALS: Goals reviewed with patient? Yes  SHORT TERM GOALS: Target date: 09/15/2022    Will be compliant with appropriate progressive HEP  Baseline: Goal status: INITIAL  2.  L  shoulder ROM to be equal to that of R  Baseline:  Goal status: INITIAL  3.  Pain in L shoulder to be no more than 5/10 Baseline:  Goal status: INITIAL  4.  Thoracic ROM limitations to have resolved  Baseline:  Goal status: INITIAL   LONG TERM GOALS: Target date: 10/06/2022    MMT to improve by 1 grade in all weak groups  Baseline:  Goal status: INITIAL  2.  Pain in L shoulder to be no more than 3/10 Baseline:  Goal status: INITIAL  3.  Will be able to perform all work duties without increase in pain  Baseline:  Goal status: INITIAL  4.  Will be able to perform all dressing/bathing tasks without increase in pain  Baseline:  Goal status: INITIAL   PLAN:  PT FREQUENCY: 2x/week  PT DURATION: 6 weeks  PLANNED INTERVENTIONS: Therapeutic exercises, Therapeutic activity, Neuromuscular re-education, Balance training, Gait training, Patient/Family education, Self Care, Joint mobilization, Aquatic Therapy, Dry Needling, Electrical stimulation, Cryotherapy, Moist heat, Taping, Ultrasound, Ionotophoresis 4mg /ml Dexamethasone, Manual therapy, and Re-evaluation  PLAN FOR NEXT SESSION: on hold until after she sees MD   Ann Lions PT DPT PN2 09/17/2022, 10:51 AM

## 2022-09-18 ENCOUNTER — Other Ambulatory Visit (HOSPITAL_COMMUNITY): Payer: Self-pay

## 2022-09-18 ENCOUNTER — Other Ambulatory Visit (HOSPITAL_BASED_OUTPATIENT_CLINIC_OR_DEPARTMENT_OTHER): Payer: Self-pay

## 2022-09-19 ENCOUNTER — Other Ambulatory Visit: Payer: Self-pay | Admitting: *Deleted

## 2022-09-19 ENCOUNTER — Inpatient Hospital Stay
Admission: RE | Admit: 2022-09-19 | Discharge: 2022-09-19 | Disposition: A | Discharge status: Home / Self Care | Payer: Self-pay | Source: Ambulatory Visit | Attending: *Deleted | Admitting: *Deleted

## 2022-09-19 ENCOUNTER — Encounter (HOSPITAL_BASED_OUTPATIENT_CLINIC_OR_DEPARTMENT_OTHER): Payer: 59 | Admitting: Physical Therapy

## 2022-09-19 DIAGNOSIS — Z1231 Encounter for screening mammogram for malignant neoplasm of breast: Secondary | ICD-10-CM

## 2022-09-23 ENCOUNTER — Ambulatory Visit: Payer: Self-pay | Admitting: *Deleted

## 2022-09-24 ENCOUNTER — Encounter (HOSPITAL_BASED_OUTPATIENT_CLINIC_OR_DEPARTMENT_OTHER): Payer: Self-pay | Admitting: Physical Therapy

## 2022-09-24 NOTE — Progress Notes (Unsigned)
   There were no vitals taken for this visit.   Subjective:    Patient ID: Cindy Carson, female    DOB: 08-30-1971, 52 y.o.   MRN: [id number]  HPI: Cindy Carson is a 52 y.o. female  No chief complaint on file.  DEPRESSION Mood status: worse Reports she has had 2 funerals and "I don't like my husband anymore and I feel stuck living in [place]"   Satisfied with current treatment?: no Symptom severity: severe  Duration of current treatment : chronic Side effects: no Medication compliance: good compliance Psychotherapy/counseling: no in the past Previous psychiatric medications: cymbalta unsure if she has been on other medications in the past  Depressed mood: yes Anxious mood: yes Anhedonia: yes Significant weight loss or gain: no Insomnia: yes hard to stay asleep Fatigue: yes Feelings of worthlessness or guilt: yes Impaired concentration/indecisiveness: yes reports concerns for memory troubles  Suicidal ideations: no Hopelessness: yes Crying spells: no    07/25/2022   10:45 AM 01/28/2022    4:09 PM 07/11/2021    8:23 AM 12/09/2018    9:21 AM  Depression screen PHQ 2/9  Decreased Interest 3 3 2  0  Down, Depressed, Hopeless 2 3 1  0  PHQ - 2 Score 5 6 3  0  Altered sleeping 3 3 3    Tired, decreased energy 3 3 3    Change in appetite 3 3 2    Feeling bad or failure about yourself  2 3 2    Trouble concentrating 2 2 2    Moving slowly or fidgety/restless 2 1 0   Suicidal thoughts 0 0 0   PHQ-9 Score 20 21 15    Difficult doing work/chores Somewhat difficult Very difficult Somewhat difficult    Relevant past medical, surgical, family and social history reviewed and updated as indicated. Interim medical history since our last visit reviewed. Allergies and medications reviewed and updated.  Review of Systems  Per HPI unless specifically indicated above     Objective:    There were no vitals taken for this visit.  Wt Readings from Last 3 Encounters:  07/25/22 188 lb 8 oz (85.5 kg)   04/02/22 180 lb (81.6 kg)  03/19/22 186 lb 3.2 oz (84.5 kg)    Physical Exam  Results for orders placed or performed in visit on 07/25/22  Bayer DCA Hb A1c Waived (STAT)  Result Value Ref Range   HB A1C (BAYER DCA - WAIVED) 8.3 (H) 4.8 - 5.6 %  Microalbumin, Urine Waived (STAT)  Result Value Ref Range   Microalb, Ur Waived 30 (H) 0 - 19 mg/L   Creatinine, Urine Waived 200 10 - 300 mg/dL   Microalb/Creat Ratio <30 <30 mg/g      Assessment & Plan:   Problem List Items Addressed This Visit   None    Follow up plan: No follow-ups on file.

## 2022-09-25 ENCOUNTER — Encounter: Payer: Self-pay | Admitting: Nurse Practitioner

## 2022-09-25 ENCOUNTER — Ambulatory Visit (INDEPENDENT_AMBULATORY_CARE_PROVIDER_SITE_OTHER): Payer: 59 | Admitting: Nurse Practitioner

## 2022-09-25 ENCOUNTER — Other Ambulatory Visit (HOSPITAL_BASED_OUTPATIENT_CLINIC_OR_DEPARTMENT_OTHER): Payer: Self-pay

## 2022-09-25 VITALS — BP 122/76 | HR 86 | Temp 97.9°F | Wt 181.8 lb

## 2022-09-25 DIAGNOSIS — E669 Obesity, unspecified: Secondary | ICD-10-CM | POA: Diagnosis not present

## 2022-09-25 DIAGNOSIS — I152 Hypertension secondary to endocrine disorders: Secondary | ICD-10-CM | POA: Diagnosis not present

## 2022-09-25 DIAGNOSIS — F339 Major depressive disorder, recurrent, unspecified: Secondary | ICD-10-CM | POA: Diagnosis not present

## 2022-09-25 DIAGNOSIS — E1165 Type 2 diabetes mellitus with hyperglycemia: Secondary | ICD-10-CM

## 2022-09-25 DIAGNOSIS — E1159 Type 2 diabetes mellitus with other circulatory complications: Secondary | ICD-10-CM

## 2022-09-25 DIAGNOSIS — Z6834 Body mass index (BMI) 34.0-34.9, adult: Secondary | ICD-10-CM

## 2022-09-25 DIAGNOSIS — E785 Hyperlipidemia, unspecified: Secondary | ICD-10-CM | POA: Diagnosis not present

## 2022-09-25 DIAGNOSIS — E1169 Type 2 diabetes mellitus with other specified complication: Secondary | ICD-10-CM | POA: Diagnosis not present

## 2022-09-25 MED ORDER — ATORVASTATIN CALCIUM 20 MG PO TABS
20.0000 mg | ORAL_TABLET | Freq: Every day | ORAL | 1 refills | Status: DC
  Filled 2022-09-25: qty 90, 90d supply, fill #0
  Filled 2022-09-28: qty 30, 30d supply, fill #0
  Filled 2022-10-28: qty 30, 30d supply, fill #1
  Filled 2022-12-08: qty 30, 30d supply, fill #2
  Filled 2023-01-10: qty 90, 90d supply, fill #3

## 2022-09-25 MED ORDER — DULOXETINE HCL 30 MG PO CPEP
30.0000 mg | ORAL_CAPSULE | Freq: Every day | ORAL | 1 refills | Status: DC
  Filled 2022-09-25: qty 90, 90d supply, fill #0
  Filled 2022-10-28: qty 30, 30d supply, fill #0
  Filled 2022-12-08: qty 30, 30d supply, fill #1
  Filled 2023-01-10: qty 90, 90d supply, fill #2

## 2022-09-25 MED ORDER — AMLODIPINE BESYLATE 5 MG PO TABS
5.0000 mg | ORAL_TABLET | Freq: Every day | ORAL | 1 refills | Status: DC
  Filled 2022-09-25: qty 30, 30d supply, fill #0
  Filled 2022-10-28: qty 30, 30d supply, fill #1
  Filled 2022-12-08: qty 30, 30d supply, fill #2
  Filled 2023-01-10: qty 90, 90d supply, fill #3

## 2022-09-25 NOTE — Assessment & Plan Note (Signed)
Chronic. Not under great control. Did not like how Effexor made her feel.  Went back on Cymbalta.  Not able to tolerate increased dose.  Declines adding additional medications at visit today.  Refill sent of Cymbalta.  Follow up in 2 months.  Call sooner if concerns arise.

## 2022-09-25 NOTE — Assessment & Plan Note (Signed)
Chronic.  Controlled.  Continue with current medication regimen of Amlodipine 5mg  daily.  Refills sent today. Return to clinic in 2 months for reevaluation.  Call sooner if concerns arise.

## 2022-09-25 NOTE — Assessment & Plan Note (Signed)
Recommended eating smaller high protein, low fat meals more frequently and exercising 30 mins a day 5 times a week with a goal of 10-15lb weight loss in the next 3 months.  

## 2022-09-25 NOTE — Assessment & Plan Note (Signed)
Chronic. Not well controlled.  Last A1c was 8.3.  Ozempic was increased to 2mg .  Continue with current dose.  Follow up in 2 months.  Will recheck labs at next visit.

## 2022-09-25 NOTE — Assessment & Plan Note (Signed)
Chronic.  Controlled.  Continue with current medication regimen of Atorvastatin.  Labs ordered today.  Return to clinic in 6 months for reevaluation.  Call sooner if concerns arise.   

## 2022-09-26 ENCOUNTER — Encounter (HOSPITAL_BASED_OUTPATIENT_CLINIC_OR_DEPARTMENT_OTHER): Payer: Self-pay | Admitting: Physical Therapy

## 2022-09-28 ENCOUNTER — Other Ambulatory Visit (HOSPITAL_BASED_OUTPATIENT_CLINIC_OR_DEPARTMENT_OTHER): Payer: Self-pay

## 2022-09-29 ENCOUNTER — Other Ambulatory Visit (HOSPITAL_BASED_OUTPATIENT_CLINIC_OR_DEPARTMENT_OTHER): Payer: Self-pay

## 2022-09-30 ENCOUNTER — Other Ambulatory Visit (HOSPITAL_BASED_OUTPATIENT_CLINIC_OR_DEPARTMENT_OTHER): Payer: Self-pay

## 2022-10-01 ENCOUNTER — Other Ambulatory Visit (HOSPITAL_BASED_OUTPATIENT_CLINIC_OR_DEPARTMENT_OTHER): Payer: Self-pay

## 2022-10-01 ENCOUNTER — Ambulatory Visit (HOSPITAL_BASED_OUTPATIENT_CLINIC_OR_DEPARTMENT_OTHER): Payer: Commercial Managed Care - PPO | Attending: Orthopedic Surgery | Admitting: Physical Therapy

## 2022-10-01 DIAGNOSIS — M25512 Pain in left shoulder: Secondary | ICD-10-CM | POA: Insufficient documentation

## 2022-10-01 DIAGNOSIS — M6281 Muscle weakness (generalized): Secondary | ICD-10-CM

## 2022-10-01 DIAGNOSIS — G8929 Other chronic pain: Secondary | ICD-10-CM | POA: Diagnosis present

## 2022-10-01 NOTE — Therapy (Signed)
OUTPATIENT PHYSICAL THERAPY SHOULDER TREATMENT   Patient Name: Cindy Carson MRN: [id number] DOB:09-08-1971, 52 y.o., female Today's Date: 10/01/2022  END OF SESSION:       Past Medical History:  Diagnosis Date   Anemia 12/09/2018   Chronic low back pain    Chronic neck pain    Diabetes type 2, uncontrolled    diagnosed in 2019   Hyperlipidemia 08/01/2021   Hypertension complicating diabetes ([place])    Neck pain 07/11/2021   Obesity 12/09/2018   OSA (obstructive sleep apnea)    sleep study done in [place] in 2019 per pt   Rash 01/28/2022   Screening for colon cancer 08/01/2021   Past Surgical History:  Procedure Laterality Date   COLONOSCOPY     COLONOSCOPY N/A 09/03/2021   Procedure: COLONOSCOPY;  Surgeon: Lucilla Lame, MD;  Location: [facility] ENDOSCOPY;  Service: Endoscopy;  Laterality: N/A;   TUBAL LIGATION     Patient Active Problem List   Diagnosis Date Noted   Type 2 diabetes mellitus with hyperglycemia, without long-term current use of insulin ([place]) 07/25/2022   Depression, recurrent ([place]) 07/25/2022   Encounter for screening for malignant neoplasm of breast 07/11/2021   Hyperlipidemia associated with type 2 diabetes mellitus ([place]) 07/11/2021   Personal history of noncompliance with medical treatment, presenting hazards to health 02/15/2019   History of syphilis 12/09/2018   Bursitis of hip 12/09/2018   Anemia 12/09/2018   Obesity 12/09/2018   OSA (obstructive sleep apnea)    HTN (hypertension)    Hypertension complicating diabetes ([place])    Chronic low back pain    Chronic neck pain     PCP: Jon Billings NP   REFERRING PROVIDER: Thornton Park, MD  REFERRING DIAG: [phone] (ICD-10-CM) - Strain of unspecified muscle, fascia and tendon at shoulder and upper arm level, left arm, subsequent encounte  THERAPY DIAG:  No diagnosis found.  Rationale for Evaluation and Treatment: Rehabilitation  ONSET DATE: 07/31/2022  SUBJECTIVE:                                                                                                                                                                                       SUBJECTIVE STATEMENT:  Pt states her shoulder feels about the same overall.  Pt states she has days it's ok and other days it bothers her.  Pt denies any functional improvement or improvement in pain and sx's.  Pt states repetitive movement increases shoulder pain.  "Dressing and sleeping is the most painful".  Pt states she was performing her home exercises, but hasn't performed them as much.  Pt states she does her home exercises when it hurts which does  make her shoulder feel better.  Pt states she can have pain with work activities especially repetitive actions.  Pt is trying to get a 2nd opinion for her shoulder but is waiting for the MD to receive her medical records.  Pt wants to continue with PT while she is trying to see MD in order to see if she makes improvement.     PERTINENT HISTORY: C5-6 severe foraminal stenosis. L4-5 spondylolisthesis with radiculopathy.    ASSESSMENT:       Left shoulder pain secondary to impingement versus partial rotator cuff tear.         PLAN:      I recommended Ms. Mccreedy that we try her on a Medrol Dosepak. She is diabetic and will monitor closely her blood sugars. If her blood sugars elevate significantly, she will stop the steroid medication. I am also referring her back to physical therapy as I think this is important to getting her range of motion and strength back. She will follow up with me in 6 to 8 weeks. If she has persistent pain, she may be considered for an intra-articular injection of corticosteroid versus considering arthroscopic evaluation of the left shoulder. Patient understood and agreed with this plan.     PAIN:  Are you having pain? Yes: NPRS scale: 8/10 current; 10-11/10 at worst; 5/10 best Pain location: L anterior shoulder currently, but also radiates to elbow    Pain description: throbbing, aching Aggravating factors: moving arm too much like for housework, dressing, sleeping hurts  Relieving factors: propping/supporting arm, hanging arm down like for pendulums    PRECAUTIONS: None  WEIGHT BEARING RESTRICTIONS: No  FALLS:  Has patient fallen in last 6 months? Yes. 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, registration   PLOF: Independent, Independent with basic ADLs, Independent with gait, and Independent with transfers  PATIENT GOALS:get arm feeling better if possible  NEXT MD VISIT:   OBJECTIVE:   DIAGNOSTIC FINDINGS:  L shoulder MRI: MRI images which showed a moderate grade interstitial tearing in the supraspinatus tendon involving approximately 50% of the tendon thickness. She had moderate AC joint arthrosis and subacromial bursitis. There is no evidence of a labral tear.  Per MD note  EXAM: LEFT HUMERUS - 2 VIEW; LEFT SHOULDER - 3 VIEW   COMPARISON:  None Available.   FINDINGS: No evidence of fracture or dislocation. Mild degenerative changes of the acromioclavicular joint. Soft tissues are unremarkable.   IMPRESSION: No acute osseous abnormality of the left shoulder or humerus.    PATIEN L upper arm:  Initial/Current:  36/44 with a goal of 54 at visit #11 L shoulder:  52/45 with a goal of goal of 63 at visit #11   POSTURE: Forward head, rounded shoulders, decreased lumbar and thoracic lordosis   UPPER EXTREMITY ROM:   Active ROM Right eval Left eval Left 1/10  Shoulder flexion 130 85 pain limited  130 without increased pain  Shoulder abduction  153 35 pain limited  62 with pain  Shoulder extension      Shoulder adduction     Shoulder internal rotation FIR  L5  FIR mid right glute FIR R glute with pain; 55 deg  Shoulder external rotation FER C7  FER  to occiput  FER C6 without increased pain; 26 AROM/ 31 PROM  Elbow flexion     Elbow extension     Wrist flexion     Wrist extension     Wrist ulnar deviation     Wrist radial deviation     Wrist pronation     Wrist supination     (Blank rows = not tested)   Thoracic ROM:   Flexion: WFL  SB:  R:  moderate limitation, L:  WFL Rotation:  minimal limitation bilat  UPPER EXTREMITY MMT:  MMT Right eval Left eval Left 1/10  Shoulder flexion 4+ 3 pain limited  Tolerates minimal resistance with a little pain  Shoulder extension 4 3- pain limited    Shoulder abduction 4+ 2 pain limited    Shoulder adduction     Shoulder internal rotation  DNT known 50% cuff tear    Shoulder external rotation  DNT known 50% cuff tear    Middle trapezius     Lower trapezius     Elbow flexion 4 4   Elbow extension 4 3 pain limited    Wrist flexion     Wrist extension     Wrist ulnar deviation     Wrist radial deviation     Wrist pronation     Wrist supination     Grip strength (lbs)     (Blank rows = not tested)  SHOULDER SPECIAL TESTS: Impingement tests:  SLAP lesions:  Instability tests:  Rotator cuff assessment:  Biceps assessment:   JOINT MOBILITY TESTING:    PALPATION:   B upper traps with multiple trigger points and TTP, L rotator cuff TTP, L anterior/posterior/especially middle delt very TTP, L biceps TTP    TODAY'S TREATMENT:                                                                                                                                         DATE:    09/17/22    TherEx  UBE x6 minutes (3 forward/3 backward) L1 Supine L UE flexion with cane x15 3 second holds (pain 8/10 after, rested to let pain come down) SPC bench press x15  ER AAROM x10 seated with SPC (pain to 5/10) Doorway flexion slides x10 for ROM   12/21 Manual: inferior and posterior glides grade  II and III  UBE L1 x6 min (2 min forward/2 min backward) AAROM flexion with a wand x7. Patient had significant pain despite adjustments in range  Side lying er significant pain x10 without weight   Row x10 yellow paitnet reported significant pain going forward  Shoulder extension yellow significant pain  At this time her exercises were halted 2nd to pain with all activity   09/08/22  TherEx  UBE L1 x6 min (3 min forward/3 min backward) AAROM flexion x15 with cane to about 120-13* AAROM ABD x10 with cane limited to  about 50* due to pain AAROM ER with cane x15 supine at about 20* ABD LUE flexion AROM x12 0# supine LUE ER with sheet tucked under elbow seated 0# x12 Isometrics: supine shoulder extensions onto mat table x10 with 3 second holds, standing shoulder flexion into doorframe with towel x10 with 3 second holds, standing shoulder ABD into doorframe with towel x10 with 3 second holds, shoulder ER into doorframe with towel x10 with 3 second holds effort to tolerance       Manual  PROM all directions to tolerance,gentle oscillation and cues to avoid/reduce guarding- flexion about 130*, ABD about 110*, ER about 30* with 10* ABD      09/01/22  TherEx  UBE L1 x6 minutes (3 forward/3 backward) Supine AAROM flexion x15 with cane Supine AAROM ABD x10 with cane unable to tolerate with L UE in ER, performed limited ROM with UE in IR but still very pain limited Isometrics: supine shoulder extensions onto mat table x12 with 5 second holds, standing shoulder flexion into doorframe with towel x12 with 5 second holds, standing shoulder ABD into doorframe with towel x12 with 5 second holds, shoulder ER into doorframe with towel x12 with 5 second holds effort to tolerance      Manual  PROM all directions to tolerance, mixed with gentle grade II GH jobs + oscillation and gentle distraction; only able to tolerate ABD to 80*  with UE internally rotated not externally rotated, also could  not tolerate ER today at all   Eval   Objective measures + education  TherEx  Supine shoulder flexion x10 with Cane AAROM Seated shoulder flexion stretch at table x10 Pendulums AP and lateral      PATIENT EDUCATION: Education details: exercise form and purpose, holding PT until MD follow-up as multiple therapists feel surgery is possibly indicated at this point  Person educated: Patient Education method: Programmer, multimedia, Demonstration, and Handouts Education comprehension: verbalized understanding, returned demonstration, and needs further education  HOME EXERCISE PROGRAM:  Access Code: [email or web address] URL: [email or web address] Date: 09/08/2022 Prepared by: Nedra Hai  Exercises - Seated Shoulder Flexion Table Top Stretch  - 2 x daily - 7 x weekly - 1 sets - 10 reps - 5 hold - Seated Shoulder Abduction Towel Slide at Table Top  - 2 x daily - 7 x weekly - 1 sets - 10 reps - 5 hold - Flexion-Extension Shoulder Pendulum with Table Support  - 3 x daily - 7 x weekly - 1 sets - 20 reps - Horizontal Shoulder Pendulum with Table Support  - 3 x daily - 7 x weekly - 1 sets - 20 reps - Isometric Shoulder Abduction at Wall  - 1 x daily - 7 x weekly - 3 sets - 10 reps - Isometric Shoulder Extension at Wall  - 1 x daily - 7 x weekly - 3 sets - 10 reps - Isometric Shoulder External Rotation at Wall  - 1 x daily - 7 x weekly - 3 sets - 10 reps  ASSESSMENT:  CLINICAL IMPRESSION:  Masha arrives with no major changes this morning; we did discuss going on hold after PT today- especially as multiple therapists now feel that surgery may be indicated given slow progress and difficulty tolerating exercise. Performed functional exercise as able and tolerated/appropriate. Going on hold for now until after MD visit as I am really questioning if PT is helpful for her at this point/may even have potential to aggravate condition and would like formal opinion from ortho before  continuing.   Improved  shoulder flexion and functional ER AROM.  Pt demonstrates improved thoracic ROM though does still have limitation.  L upper arm:  Initial/Current:  36/44 with a goal of 54 at visit #11 L shoulder:  52/45 with a goal of goal of 63 at visit #11  OBJECTIVE IMPAIRMENTS: decreased ROM, decreased strength, hypomobility, increased fascial restrictions, increased muscle spasms, impaired UE functional use, improper body mechanics, postural dysfunction, and pain.   ACTIVITY LIMITATIONS: carrying, lifting, transfers, bed mobility, bathing, toileting, dressing, and reach over head  PARTICIPATION LIMITATIONS: cleaning, laundry, driving, shopping, community activity, and occupation  PERSONAL FACTORS: Age, Behavior pattern, Fitness, Past/current experiences, Social background, and Time since onset of injury/illness/exacerbation are also affecting patient's functional outcome.   REHAB POTENTIAL: Fair limited results from PT earlier this year, new 50% tear   CLINICAL DECISION MAKING: Stable/uncomplicated  EVALUATION COMPLEXITY: Moderate   GOALS: Goals reviewed with patient? Yes  SHORT TERM GOALS: Target date: 09/15/2022    Will be compliant with appropriate progressive HEP  Baseline: Goal status: ONGOING  2.  L shoulder ROM to be equal to that of R  Baseline:  Goal status: INITIAL  3.  Pain in L shoulder to be no more than 5/10 Baseline:  Goal status: NOT MET  4.  Thoracic ROM limitations to have resolved  Baseline:  Goal status: INITIAL   LONG TERM GOALS: Target date: 10/06/2022    MMT to improve by 1 grade in all weak groups  Baseline:  Goal status: INITIAL  2.  Pain in L shoulder to be no more than 3/10 Baseline:  Goal status: NOT MET  3.  Will be able to perform all work duties without increase in pain  Baseline:  Goal status: ONGOING  4.  Will be able to perform all dressing/bathing tasks without increase in pain  Baseline:  Goal status: NOT MET   PLAN:  PT  FREQUENCY: 2x/week  PT DURATION: 6 weeks  PLANNED INTERVENTIONS: Therapeutic exercises, Therapeutic activity, Neuromuscular re-education, Balance training, Gait training, Patient/Family education, Self Care, Joint mobilization, Aquatic Therapy, Dry Needling, Electrical stimulation, Cryotherapy, Moist heat, Taping, Ultrasound, Ionotophoresis 4mg /ml Dexamethasone, Manual therapy, and Re-evaluation  PLAN FOR NEXT SESSION: on hold until after she sees MD   PT DPT PN2 10/01/2022, 11:20 AM

## 2022-10-02 ENCOUNTER — Encounter (HOSPITAL_BASED_OUTPATIENT_CLINIC_OR_DEPARTMENT_OTHER): Payer: Self-pay | Admitting: Physical Therapy

## 2022-10-03 ENCOUNTER — Ambulatory Visit (HOSPITAL_BASED_OUTPATIENT_CLINIC_OR_DEPARTMENT_OTHER): Payer: Commercial Managed Care - PPO | Admitting: Physical Therapy

## 2022-10-08 ENCOUNTER — Encounter (HOSPITAL_BASED_OUTPATIENT_CLINIC_OR_DEPARTMENT_OTHER): Payer: Self-pay | Admitting: Physical Therapy

## 2022-10-08 ENCOUNTER — Other Ambulatory Visit (HOSPITAL_BASED_OUTPATIENT_CLINIC_OR_DEPARTMENT_OTHER): Payer: Self-pay

## 2022-10-09 ENCOUNTER — Ambulatory Visit (INDEPENDENT_AMBULATORY_CARE_PROVIDER_SITE_OTHER): Payer: 59 | Admitting: Nurse Practitioner

## 2022-10-09 ENCOUNTER — Other Ambulatory Visit (HOSPITAL_COMMUNITY)
Admission: RE | Admit: 2022-10-09 | Discharge: 2022-10-09 | Disposition: A | Discharge status: Home / Self Care | Payer: Commercial Managed Care - PPO | Source: Ambulatory Visit | Attending: Nurse Practitioner | Admitting: Nurse Practitioner

## 2022-10-09 ENCOUNTER — Encounter: Payer: Self-pay | Admitting: Nurse Practitioner

## 2022-10-09 ENCOUNTER — Other Ambulatory Visit (HOSPITAL_BASED_OUTPATIENT_CLINIC_OR_DEPARTMENT_OTHER): Payer: Self-pay

## 2022-10-09 ENCOUNTER — Other Ambulatory Visit: Payer: Self-pay

## 2022-10-09 VITALS — BP 118/84 | HR 83 | Ht 62.75 in | Wt 182.0 lb

## 2022-10-09 DIAGNOSIS — Z113 Encounter for screening for infections with a predominantly sexual mode of transmission: Secondary | ICD-10-CM | POA: Diagnosis not present

## 2022-10-09 DIAGNOSIS — Z01419 Encounter for gynecological examination (general) (routine) without abnormal findings: Secondary | ICD-10-CM

## 2022-10-09 DIAGNOSIS — Z780 Asymptomatic menopausal state: Secondary | ICD-10-CM | POA: Diagnosis not present

## 2022-10-09 DIAGNOSIS — N898 Other specified noninflammatory disorders of vagina: Secondary | ICD-10-CM | POA: Diagnosis not present

## 2022-10-09 DIAGNOSIS — B3731 Acute candidiasis of vulva and vagina: Secondary | ICD-10-CM

## 2022-10-09 LAB — WET PREP FOR TRICH, YEAST, CLUE

## 2022-10-09 MED ORDER — FLUCONAZOLE 150 MG PO TABS
150.0000 mg | ORAL_TABLET | ORAL | 0 refills | Status: DC
  Filled 2022-10-09: qty 2, 6d supply, fill #0

## 2022-10-09 NOTE — Progress Notes (Signed)
Cindy Carson 1971/05/13 [id number]   History:  52 y.o. [id number] presents as new patient to establish care. Postmenopausal. LMP just over a year ago. No HRT. Complains of vaginal discharge and irritation x 2 weeks. Denies dysuria or odor. Reports abnormal pap history with colpo ~6 years, neg biopsies. H/O DM, HTN, HLD, syphilis.   Gynecologic History Patient's last menstrual period was 03/09/2019.   Contraception/Family planning: post menopausal status and tubal ligation Sexually active: Most recently 2 months ago  Health Maintenance Last Pap: 08/06/2020. Results were: Normal neg HPV Last mammogram: 09/16/2022. Results were: Normal Last colonoscopy: 09/03/2021. Results were: Normal, 10-year recall Last Dexa: Not indicated  Past medical history, past surgical history, family history and social history were all reviewed and documented in the EPIC chart. 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  ROS:  A ROS was performed and pertinent positives and negatives are included.  Exam:  Vitals:   10/09/22 1049  BP: 118/84  Pulse: 83  SpO2: 97%  Weight: 182 lb (82.6 kg)  Height: 5' 2.75" (1.594 m)   Body mass index is 32.5 kg/m.  General appearance:  Normal Thyroid:  Symmetrical, normal in size, without palpable masses or nodularity. Respiratory  Auscultation:  Clear without wheezing or rhonchi Cardiovascular  Auscultation:  Regular rate, without rubs, murmurs or gallops  Edema/varicosities:  Not grossly evident Abdominal  Soft,nontender, without masses, guarding or rebound.  Liver/spleen:  No organomegaly noted  Hernia:  None appreciated  Skin  Inspection:  Grossly normal Breasts: Examined lying and sitting.   Right: Without masses, retractions, nipple discharge or axillary adenopathy.   Left: Without masses, retractions, nipple discharge or axillary adenopathy. Genitourinary   Inguinal/mons:  Normal without inguinal  adenopathy  External genitalia:  Normal appearing vulva with no masses, tenderness, or lesions  BUS/Urethra/Skene's glands:  Normal  Vagina:  Discharge present, no erythema  Cervix:  Normal appearing without discharge or lesions  Uterus:  Normal in size, shape and contour.  Midline and mobile, nontender  Adnexa/parametria:     Rt: Normal in size, without masses or tenderness.   Lt: Normal in size, without masses or tenderness.  Anus and perineum: Normal  Digital rectal exam: Deferred  Patient informed chaperone available to be present for breast and pelvic exam. Patient has requested no chaperone to be present. Patient has been advised what will be completed during breast and pelvic exam.   Wet prep + yeast  Assessment/Plan:  52 y.o. [id number] to establish care.   Well female exam with routine gynecological exam - Plan: Cytology - PAP( [facility]). Education provided on SBEs, importance of preventative screenings, current guidelines, high calcium diet, regular exercise, and multivitamin daily. Labs with PCP.   Postmenopausal - no HRT, no bleeding  Vaginal discharge - Plan: WET PREP FOR [place], YEAST, CLUE. Wet prep positive for yeast  Vaginal candidiasis - Plan: fluconazole (DIFLUCAN) 150 MG tablet today and repeat in 3 days for total of 2 doses.   Screening examination for STD (sexually transmitted disease) - Plan: Cytology - PAP( [place]), RPR, HIV Antibody (routine testing w rflx). GC/CT/trich added to pap.  Screening for cervical cancer - Reports abnormal pap ~6 years ago, neg biopsies. Normal since. Pap today.   Screening for breast cancer - Normal mammogram history.  Continue annual screenings.  Normal breast exam today.  Screening for colon cancer - 08/2021 colonoscopy. Will repeat at 10-year interval per GI's recommendation.   Screening for osteoporosis -  Average risk. Will plan DXA at age 89.   Return in 1 year for annual.     Tamela Gammon DNP, 11:15 AM  10/09/2022

## 2022-10-10 ENCOUNTER — Encounter (HOSPITAL_BASED_OUTPATIENT_CLINIC_OR_DEPARTMENT_OTHER): Payer: Self-pay | Admitting: Physical Therapy

## 2022-10-10 ENCOUNTER — Ambulatory Visit (HOSPITAL_BASED_OUTPATIENT_CLINIC_OR_DEPARTMENT_OTHER): Payer: Commercial Managed Care - PPO | Admitting: Physical Therapy

## 2022-10-10 LAB — CYTOLOGY - PAP
Chlamydia: NEGATIVE
Comment: NEGATIVE
Comment: NEGATIVE
Comment: NEGATIVE
Comment: NORMAL
Diagnosis: NEGATIVE
High risk HPV: NEGATIVE
Neisseria Gonorrhea: NEGATIVE
Trichomonas: NEGATIVE

## 2022-10-11 LAB — T PALLIDUM AB: T Pallidum Abs: POSITIVE — AB

## 2022-10-11 LAB — RPR: RPR Ser Ql: REACTIVE — AB

## 2022-10-11 LAB — RPR TITER: RPR Titer: 1:2 {titer} — ABNORMAL HIGH

## 2022-10-11 LAB — HIV ANTIBODY (ROUTINE TESTING W REFLEX): HIV 1&2 Ab, 4th Generation: NONREACTIVE

## 2022-10-15 ENCOUNTER — Other Ambulatory Visit: Payer: Self-pay

## 2022-10-27 ENCOUNTER — Other Ambulatory Visit (HOSPITAL_BASED_OUTPATIENT_CLINIC_OR_DEPARTMENT_OTHER): Payer: Self-pay

## 2022-10-27 ENCOUNTER — Encounter: Payer: Self-pay | Admitting: Nurse Practitioner

## 2022-10-27 ENCOUNTER — Encounter (HOSPITAL_BASED_OUTPATIENT_CLINIC_OR_DEPARTMENT_OTHER): Payer: Self-pay | Admitting: Pharmacist

## 2022-10-27 ENCOUNTER — Telehealth (INDEPENDENT_AMBULATORY_CARE_PROVIDER_SITE_OTHER): Payer: 59 | Admitting: Nurse Practitioner

## 2022-10-27 DIAGNOSIS — F339 Major depressive disorder, recurrent, unspecified: Secondary | ICD-10-CM | POA: Diagnosis not present

## 2022-10-27 DIAGNOSIS — E119 Type 2 diabetes mellitus without complications: Secondary | ICD-10-CM

## 2022-10-27 DIAGNOSIS — E1165 Type 2 diabetes mellitus with hyperglycemia: Secondary | ICD-10-CM

## 2022-10-27 DIAGNOSIS — Z7985 Long-term (current) use of injectable non-insulin antidiabetic drugs: Secondary | ICD-10-CM

## 2022-10-27 DIAGNOSIS — I152 Hypertension secondary to endocrine disorders: Secondary | ICD-10-CM

## 2022-10-27 DIAGNOSIS — E785 Hyperlipidemia, unspecified: Secondary | ICD-10-CM

## 2022-10-27 DIAGNOSIS — E1169 Type 2 diabetes mellitus with other specified complication: Secondary | ICD-10-CM

## 2022-10-27 DIAGNOSIS — E1159 Type 2 diabetes mellitus with other circulatory complications: Secondary | ICD-10-CM | POA: Diagnosis not present

## 2022-10-27 MED ORDER — OZEMPIC (2 MG/DOSE) 8 MG/3ML ~~LOC~~ SOPN
2.0000 mg | PEN_INJECTOR | SUBCUTANEOUS | 1 refills | Status: DC
  Filled 2022-10-27: qty 9, 84d supply, fill #0

## 2022-10-27 NOTE — Assessment & Plan Note (Signed)
Chronic. Improved with being back on Cymbalta.  Not able to tolerate increased dose.  Follow up in 3 months.  Call sooner if concerns arise.

## 2022-10-27 NOTE — Assessment & Plan Note (Signed)
Chronic.  Controlled.  Continue with current medication regimen.  Labs ordered today.  Return to clinic in 6 months for reevaluation.  Call sooner if concerns arise.  ? ?

## 2022-10-27 NOTE — Assessment & Plan Note (Signed)
Chronic.  Controlled.  Continue with current medication regimen of Amlodipine daily.  Labs ordered today.  Return to clinic in 3 months for reevaluation.  Call sooner if concerns arise.

## 2022-10-27 NOTE — Progress Notes (Signed)
LMP 03/09/2019    Subjective:    Patient ID: Cindy Carson, female    DOB: 07/04/1971, 52 y.o.   MRN: [id number]  HPI: Cindy Carson is a 52 y.o. female  Chief Complaint  Patient presents with   Depression   Diabetes   Hypertension   DEPRESSION Patient states she feels like the Cymbalta is working well for her.  Denies concerns a visit today.    Satisfied with current treatment?: no Symptom severity: severe  Duration of current treatment : chronic Side effects: no Medication compliance: good compliance Psychotherapy/counseling: no in the past Previous psychiatric medications: cymbalta unsure if she has been on other medications in the past  Depressed mood: yes Anxious mood: yes Anhedonia: yes Significant weight loss or gain: no Insomnia: yes hard to stay asleep Fatigue: yes Feelings of worthlessness or guilt: yes Impaired concentration/indecisiveness: yes reports concerns for memory troubles  Suicidal ideations: no Hopelessness: yes Crying spells: no    07/25/2022   10:45 AM 01/28/2022    4:09 PM 07/11/2021    8:23 AM 12/09/2018    9:21 AM  Depression screen PHQ 2/9  Decreased Interest 3 3 2  0  Down, Depressed, Hopeless 2 3 1  0  PHQ - 2 Score 5 6 3  0  Altered sleeping 3 3 3    Tired, decreased energy 3 3 3    Change in appetite 3 3 2    Feeling bad or failure about yourself  2 3 2    Trouble concentrating 2 2 2    Moving slowly or fidgety/restless 2 1 0   Suicidal thoughts 0 0 0   PHQ-9 Score 20 21 15    Difficult doing work/chores Somewhat difficult Very difficult Somewhat difficult       10/27/2022    9:39 AM 09/25/2022   10:45 AM 07/25/2022   10:44 AM 01/28/2022    4:09 PM  GAD 7 : Generalized Anxiety Score  Nervous, Anxious, on Edge 0 0 0 2  Control/stop worrying 0 1 1 1   Worry too much - different things 1 1 1 2   Trouble relaxing 0 1 3 2   Restless 0 1 0 1  Easily annoyed or irritable 0 1 1 2   Afraid - awful might happen 0 1 0   Total GAD 7 Score 1 6 6    Anxiety  Difficulty Not difficult at all Somewhat difficult Somewhat difficult Very difficult    DIABETES Hypoglycemic episodes:no Polydipsia/polyuria: no Visual disturbance: no Chest pain: no Paresthesias: no Glucose Monitoring: no  Accucheck frequency: Not Checking  Fasting glucose:  Post prandial:  Evening:  Before meals: Taking Insulin?: no  Long acting insulin:  Short acting insulin: Blood Pressure Monitoring: not checking Retinal Examination: Not up to Date Foot Exam: Up to Date Diabetic Education: Not Completed Pneumovax: Up to Date Influenza: Up to Date Aspirin: no  HYPERTENSION without Chronic Kidney Disease Hypertension status: controlled  Satisfied with current treatment? yes Duration of hypertension: years BP monitoring frequency:  not checking BP range:  BP medication side effects:  no Medication compliance: excellent compliance Previous BP meds: amlodipine Aspirin: no Recurrent headaches: no Visual changes: no Palpitations: no Dyspnea: no Chest pain: no Lower extremity edema: no Dizzy/lightheaded: no   Relevant past medical, surgical, family and social history reviewed and updated as indicated. Interim medical history since our last visit reviewed. Allergies and medications reviewed and updated.  Review of Systems  Psychiatric/Behavioral:  Negative for suicidal ideas. The patient is nervous/anxious.     Per HPI unless  specifically indicated above     Objective:    LMP 03/09/2019   Wt Readings from Last 3 Encounters:  10/09/22 182 lb (82.6 kg)  09/25/22 181 lb 12.8 oz (82.5 kg)  07/25/22 188 lb 8 oz (85.5 kg)    Physical Exam Vitals and nursing note reviewed.  HENT:     Head: Normocephalic.     Right Ear: Hearing normal.     Left Ear: Hearing normal.     Nose: Nose normal.  Eyes:     Pupils: Pupils are equal, round, and reactive to light.  Pulmonary:     Effort: Pulmonary effort is normal. No respiratory distress.  Neurological:      Mental Status: She is alert.  Psychiatric:        Mood and Affect: Mood normal.        Behavior: Behavior normal.        Thought Content: Thought content normal.        Judgment: Judgment normal.     Results for orders placed or performed in visit on 10/09/22  WET PREP FOR [place], YEAST, CLUE   Specimen: Vaginal Fluid  Result Value Ref Range   Source: VAGINA    RESULT    RPR  Result Value Ref Range   RPR Ser Ql REACTIVE (A) NON-REACTIVE  HIV Antibody (routine testing w rflx)  Result Value Ref Range   HIV 1&2 Ab, 4th Generation NON-REACTIVE NON-REACTIVE  Rpr titer  Result Value Ref Range   RPR Titer 1:2 (H)   T PALLIDUM AB  Result Value Ref Range   T Pallidum Abs POSITIVE (A) NEGATIVE  Cytology - PAP( [place])  Result Value Ref Range   High risk HPV Negative    Neisseria Gonorrhea Negative    Chlamydia Negative    Trichomonas Negative    Adequacy      Satisfactory for evaluation; transformation zone component PRESENT.   Diagnosis      - Negative for intraepithelial lesion or malignancy (NILM)   Microorganisms      Fungal organisms present consistent with Candida spp.   Comment Normal Reference Range HPV - Negative    Comment Normal Reference Range Trichomonas - Negative    Comment Normal Reference Ranger Chlamydia - Negative    Comment      Normal Reference Range Neisseria Gonorrhea - Negative      Assessment & Plan:   Problem List Items Addressed This Visit       Cardiovascular and Mediastinum   Hypertension complicating diabetes ([place])    Chronic.  Controlled.  Continue with current medication regimen of Amlodipine daily.  Labs ordered today.  Return to clinic in 3 months for reevaluation.  Call sooner if concerns arise.        Relevant Medications   Semaglutide, 2 MG/DOSE, (OZEMPIC, 2 MG/DOSE,) 8 MG/3ML SOPN     Endocrine   Hyperlipidemia associated with type 2 diabetes mellitus ([place]) - Primary    Chronic.  Controlled.  Continue with current  medication regimen.  Labs ordered today.  Return to clinic in 6 months for reevaluation.  Call sooner if concerns arise.        Relevant Medications   Semaglutide, 2 MG/DOSE, (OZEMPIC, 2 MG/DOSE,) 8 MG/3ML SOPN   Other Relevant Orders   Lipid Profile   Type 2 diabetes mellitus with hyperglycemia, without long-term current use of insulin (HCC)    Chronic. Not well controlled.  Last A1c was 8.3.  Doing well with Ozempic  2mg .   Not checking sugars.  Labs ordered today.  Will make recommendations based on lab results.  Follow up in 3 months.        Relevant Medications   Semaglutide, 2 MG/DOSE, (OZEMPIC, 2 MG/DOSE,) 8 MG/3ML SOPN   Other Relevant Orders   Comp Met (CMET)   HgB A1c     Other   Depression, recurrent (HCC)    Chronic. Improved with being back on Cymbalta.  Not able to tolerate increased dose.  Follow up in 3 months.  Call sooner if concerns arise.         Follow up plan: Return in about 3 months (around 01/25/2023) for HTN, HLD, DM2 FU.   This visit was completed via MyChart due to the restrictions of the COVID-19 pandemic. All issues as above were discussed and addressed. Physical exam was done as above through visual confirmation on MyChart. If it was felt that the patient should be evaluated in the office, they were directed there. The patient verbally consented to this visit. Location of the patient: Home Location of the provider: Office Those involved with this call:  Provider: Jon Billings, NP CMA: Yvonna Alanis, CMA Front Desk/Registration: Lynnell Catalan This encounter was conducted via video.  I spent 30 dedicated to the care of this patient on the date of this encounter to include previsit review of symptoms, plan of care and follow up, face to face time with the patient, and post visit ordering of testing.

## 2022-10-27 NOTE — Assessment & Plan Note (Signed)
Chronic. Not well controlled.  Last A1c was 8.3.  Doing well with Ozempic 2mg .   Not checking sugars.  Labs ordered today.  Will make recommendations based on lab results.  Follow up in 3 months.

## 2022-10-28 ENCOUNTER — Other Ambulatory Visit (HOSPITAL_BASED_OUTPATIENT_CLINIC_OR_DEPARTMENT_OTHER): Payer: Self-pay

## 2022-10-29 ENCOUNTER — Telehealth: Payer: Self-pay

## 2022-10-29 NOTE — Telephone Encounter (Signed)
PA for Ozempic initiated and submitted via Cover My Meds. Key: Irven Shelling

## 2022-11-02 ENCOUNTER — Other Ambulatory Visit (HOSPITAL_BASED_OUTPATIENT_CLINIC_OR_DEPARTMENT_OTHER): Payer: Self-pay

## 2022-11-05 ENCOUNTER — Other Ambulatory Visit (HOSPITAL_BASED_OUTPATIENT_CLINIC_OR_DEPARTMENT_OTHER): Payer: Self-pay

## 2022-11-24 ENCOUNTER — Encounter: Payer: Self-pay | Admitting: Nurse Practitioner

## 2022-11-24 ENCOUNTER — Other Ambulatory Visit (HOSPITAL_BASED_OUTPATIENT_CLINIC_OR_DEPARTMENT_OTHER): Payer: Self-pay

## 2022-11-24 ENCOUNTER — Ambulatory Visit (INDEPENDENT_AMBULATORY_CARE_PROVIDER_SITE_OTHER): Payer: 59 | Admitting: Nurse Practitioner

## 2022-11-24 VITALS — BP 113/73 | HR 76 | Temp 97.9°F | Wt 181.3 lb

## 2022-11-24 DIAGNOSIS — I152 Hypertension secondary to endocrine disorders: Secondary | ICD-10-CM

## 2022-11-24 DIAGNOSIS — E785 Hyperlipidemia, unspecified: Secondary | ICD-10-CM

## 2022-11-24 DIAGNOSIS — E1159 Type 2 diabetes mellitus with other circulatory complications: Secondary | ICD-10-CM | POA: Diagnosis not present

## 2022-11-24 DIAGNOSIS — E1169 Type 2 diabetes mellitus with other specified complication: Secondary | ICD-10-CM | POA: Diagnosis not present

## 2022-11-24 DIAGNOSIS — E1165 Type 2 diabetes mellitus with hyperglycemia: Secondary | ICD-10-CM | POA: Diagnosis not present

## 2022-11-24 DIAGNOSIS — F339 Major depressive disorder, recurrent, unspecified: Secondary | ICD-10-CM

## 2022-11-24 MED ORDER — TRULICITY 0.75 MG/0.5ML ~~LOC~~ SOAJ
0.7500 mg | SUBCUTANEOUS | 0 refills | Status: DC
  Filled 2022-11-24: qty 2, 28d supply, fill #0

## 2022-11-24 NOTE — Progress Notes (Signed)
BP 113/73   Pulse 76   Temp 97.9 F (36.6 C) (Oral)   Wt 181 lb 4.8 oz (82.2 kg)   LMP 03/09/2019   SpO2 95%   BMI 32.37 kg/m    Subjective:    Patient ID: Cindy Carson, female    DOB: 07/21/1971, 52 y.o.   MRN: [id number]  HPI: Cindy Carson is a 52 y.o. female  Chief Complaint  Patient presents with   Depression   Diabetes    No recent eye exam, requesting referral to be sent for one    Hyperlipidemia   Hypertension   HYPERTENSION without Chronic Kidney Disease Hypertension status: controlled  Satisfied with current treatment? yes Duration of hypertension: years BP monitoring frequency:  not checking BP range:  BP medication side effects:  no Medication compliance: excellent compliance Previous BP meds:amlodipine Aspirin: no Recurrent headaches: no Visual changes: no Palpitations: no Dyspnea: no Chest pain: no Lower extremity edema: no Dizzy/lightheaded: no  DIABETES Patient states has been out of her Ozempic due to insurance no longer covering it.  Hypoglycemic episodes:no Polydipsia/polyuria: no Visual disturbance: no Chest pain: no Paresthesias: no Glucose Monitoring: no  Accucheck frequency: Not Checking  Fasting glucose:  Post prandial:  Evening:  Before meals: Taking Insulin?: no  Long acting insulin:  Short acting insulin: Blood Pressure Monitoring: not checking Retinal Examination: Not up to Date Foot Exam:  up to date Diabetic Education: Not Completed Pneumovax: Up to Date Influenza: Up to Date Aspirin: no  DEPRESSION Patient states she is doing well.  Denies concerns at visit today.  Mood status: worse Reports she has had 2 funerals and "I don't like my husband anymore and I feel stuck living in [place]"   Satisfied with current treatment?: no Symptom severity: severe  Duration of current treatment : chronic Side effects: no Medication compliance: good compliance Psychotherapy/counseling: no in the past Previous psychiatric medications:  cymbalta unsure if she has been on other medications in the past  Depressed mood: yes Anxious mood: yes Anhedonia: yes Significant weight loss or gain: no Insomnia: yes hard to stay asleep Fatigue: yes Feelings of worthlessness or guilt: yes Impaired concentration/indecisiveness: yes reports concerns for memory troubles  Suicidal ideations: no Hopelessness: yes Relevant past medical, surgical, family and social history reviewed and updated as indicated. Interim medical history since our last visit reviewed. Allergies and medications reviewed and updated.  Review of Systems  Eyes:  Negative for visual disturbance.  Respiratory:  Negative for cough, chest tightness and shortness of breath.   Cardiovascular:  Negative for chest pain, palpitations and leg swelling.  Endocrine: Negative for polydipsia and polyuria.  Neurological:  Negative for dizziness, numbness and headaches.  Psychiatric/Behavioral:  Positive for dysphoric mood. Negative for suicidal ideas.     Per HPI unless specifically indicated above     Objective:    BP 113/73   Pulse 76   Temp 97.9 F (36.6 C) (Oral)   Wt 181 lb 4.8 oz (82.2 kg)   LMP 03/09/2019   SpO2 95%   BMI 32.37 kg/m   Wt Readings from Last 3 Encounters:  11/24/22 181 lb 4.8 oz (82.2 kg)  10/09/22 182 lb (82.6 kg)  09/25/22 181 lb 12.8 oz (82.5 kg)    Physical Exam Vitals and nursing note reviewed.  Constitutional:      General: She is not in acute distress.    Appearance: Normal appearance. She is normal weight. She is not ill-appearing, toxic-appearing or diaphoretic.  HENT:  Head: Normocephalic.     Right Ear: External ear normal.     Left Ear: External ear normal.     Nose: Nose normal.     Mouth/Throat:     Mouth: Mucous membranes are moist.     Pharynx: Oropharynx is clear.  Eyes:     General:        Right eye: No discharge.        Left eye: No discharge.     Extraocular Movements: Extraocular movements intact.      Conjunctiva/sclera: Conjunctivae normal.     Pupils: Pupils are equal, round, and reactive to light.  Cardiovascular:     Rate and Rhythm: Normal rate and regular rhythm.     Heart sounds: No murmur heard. Pulmonary:     Effort: Pulmonary effort is normal. No respiratory distress.     Breath sounds: Normal breath sounds. No wheezing or rales.  Musculoskeletal:     Cervical back: Normal range of motion and neck supple.  Skin:    General: Skin is warm and dry.     Capillary Refill: Capillary refill takes less than 2 seconds.  Neurological:     General: No focal deficit present.     Mental Status: She is alert and oriented to person, place, and time. Mental status is at baseline.  Psychiatric:        Mood and Affect: Mood normal.        Behavior: Behavior normal.        Thought Content: Thought content normal.        Judgment: Judgment normal.     Results for orders placed or performed in visit on 10/09/22  WET PREP FOR [place], YEAST, CLUE   Specimen: Vaginal Fluid  Result Value Ref Range   Source: VAGINA    RESULT    RPR  Result Value Ref Range   RPR Ser Ql REACTIVE (A) NON-REACTIVE  HIV Antibody (routine testing w rflx)  Result Value Ref Range   HIV 1&2 Ab, 4th Generation NON-REACTIVE NON-REACTIVE  Rpr titer  Result Value Ref Range   RPR Titer 1:2 (H)   T PALLIDUM AB  Result Value Ref Range   T Pallidum Abs POSITIVE (A) NEGATIVE  Cytology - PAP( [place])  Result Value Ref Range   High risk HPV Negative    Neisseria Gonorrhea Negative    Chlamydia Negative    Trichomonas Negative    Adequacy      Satisfactory for evaluation; transformation zone component PRESENT.   Diagnosis      - Negative for intraepithelial lesion or malignancy (NILM)   Microorganisms      Fungal organisms present consistent with Candida spp.   Comment Normal Reference Range HPV - Negative    Comment Normal Reference Range Trichomonas - Negative    Comment Normal Reference Ranger Chlamydia  - Negative    Comment      Normal Reference Range Neisseria Gonorrhea - Negative      Assessment & Plan:   Problem List Items Addressed This Visit       Cardiovascular and Mediastinum   Hypertension complicating diabetes ([place]) - Primary    Chronic.  Controlled.  Continue with current medication regimen of Amlodipine '5mg'$  daily.  Labs ordered today.  Return to clinic in 6 months for reevaluation.  Call sooner if concerns arise.        Relevant Medications   Dulaglutide (TRULICITY) [id number] SOPN   Other Relevant Orders  Comp Met (CMET)     Endocrine   Hyperlipidemia associated with type 2 diabetes mellitus (HCC)    Chronic.  Controlled.  Continue with current medication regimen of Atorvastatin daily.  Labs ordered today.  Return to clinic in 6 months for reevaluation.  Call sooner if concerns arise.        Relevant Medications   Dulaglutide (TRULICITY) [id number] SOPN   Other Relevant Orders   Lipid Profile   Type 2 diabetes mellitus with hyperglycemia, without long-term current use of insulin (HCC)    Chronic. Not well controlled.  A1c 8.3% in November.  Insurance will no longer cover Ozempic since patient hasn't tried trulicity/victoza.  Will change to Trulicity.  Will start Trulicity .'75mg'$  weekly then increase to 1.'5mg'$  weekly after 4 weeks.  Follow up in 1 month to make sure patient is tolerating medication well.       Relevant Medications   Dulaglutide (TRULICITY) [id number] SOPN   Other Relevant Orders   HgB A1c     Other   Depression, recurrent (HCC)    Chronic. Controlled on Cymbalta.  Not able to tolerate increased dose.  Follow up in 3 months.  Call sooner if concerns arise.         Follow up plan: Return in about 1 month (around 12/25/2022) for Follow up Trulicity.

## 2022-11-24 NOTE — Assessment & Plan Note (Signed)
Chronic. Controlled on Cymbalta.  Not able to tolerate increased dose.  Follow up in 3 months.  Call sooner if concerns arise.

## 2022-11-24 NOTE — Assessment & Plan Note (Signed)
Chronic. Not well controlled.  A1c 8.3% in November.  Insurance will no longer cover Ozempic since patient hasn't tried trulicity/victoza.  Will change to Trulicity.  Will start Trulicity .'75mg'$  weekly then increase to 1.'5mg'$  weekly after 4 weeks.  Follow up in 1 month to make sure patient is tolerating medication well.

## 2022-11-24 NOTE — Assessment & Plan Note (Signed)
Chronic.  Controlled.  Continue with current medication regimen of Atorvastatin daily.  Labs ordered today.  Return to clinic in 6 months for reevaluation.  Call sooner if concerns arise.

## 2022-11-24 NOTE — Assessment & Plan Note (Signed)
Chronic.  Controlled.  Continue with current medication regimen of Amlodipine '5mg'$  daily.  Labs ordered today.  Return to clinic in 6 months for reevaluation.  Call sooner if concerns arise.

## 2022-11-25 LAB — COMPREHENSIVE METABOLIC PANEL
ALT: 31 IU/L (ref 0–32)
AST: 21 IU/L (ref 0–40)
Albumin/Globulin Ratio: 1.7 (ref 1.2–2.2)
Albumin: 4.3 g/dL (ref 3.8–4.9)
Alkaline Phosphatase: 100 IU/L (ref 44–121)
BUN/Creatinine Ratio: 10 (ref 9–23)
BUN: 8 mg/dL (ref 6–24)
Bilirubin Total: 0.3 mg/dL (ref 0.0–1.2)
CO2: 25 mmol/L (ref 20–29)
Calcium: 9.3 mg/dL (ref 8.7–10.2)
Chloride: 105 mmol/L (ref 96–106)
Creatinine, Ser: 0.82 mg/dL (ref 0.57–1.00)
Globulin, Total: 2.6 g/dL (ref 1.5–4.5)
Glucose: 177 mg/dL — ABNORMAL HIGH (ref 70–99)
Potassium: 4.7 mmol/L (ref 3.5–5.2)
Sodium: 144 mmol/L (ref 134–144)
Total Protein: 6.9 g/dL (ref 6.0–8.5)
eGFR: 86 mL/min/{1.73_m2} (ref >59)

## 2022-11-25 LAB — HEMOGLOBIN A1C
Est. average glucose Bld gHb Est-mCnc: 160 mg/dL
Hgb A1c MFr Bld: 7.2 % — ABNORMAL HIGH (ref 4.8–5.6)

## 2022-11-25 LAB — LIPID PANEL
Chol/HDL Ratio: 2.5 ratio (ref 0.0–4.4)
Cholesterol, Total: 136 mg/dL (ref 100–199)
HDL: 54 mg/dL (ref >39)
LDL Chol Calc (NIH): 64 mg/dL (ref 0–99)
Triglycerides: 94 mg/dL (ref 0–149)
VLDL Cholesterol Cal: 18 mg/dL (ref 5–40)

## 2022-11-25 NOTE — Progress Notes (Signed)
Hi Cindy Carson. It was nice to see you yesterday.  Your lab work looks good.  Your A1c improved to 7.2%.  Continue with a low carb diet.  We will change over to trulicity and I'm hopeful it will help keep your A1c controlled.  No other concerns at this time. Continue with your current medication regimen.  Follow up as discussed.  Please let me know if you have any questions.

## 2022-11-26 ENCOUNTER — Other Ambulatory Visit (HOSPITAL_BASED_OUTPATIENT_CLINIC_OR_DEPARTMENT_OTHER): Payer: Self-pay

## 2022-11-28 ENCOUNTER — Telehealth: Payer: Self-pay

## 2022-11-28 NOTE — Telephone Encounter (Signed)
PA for Trulicity initiated and submitted via Cover My Meds. Key: [id number]

## 2022-12-01 ENCOUNTER — Other Ambulatory Visit (HOSPITAL_BASED_OUTPATIENT_CLINIC_OR_DEPARTMENT_OTHER): Payer: Self-pay

## 2022-12-02 ENCOUNTER — Other Ambulatory Visit (HOSPITAL_BASED_OUTPATIENT_CLINIC_OR_DEPARTMENT_OTHER): Payer: Self-pay

## 2022-12-02 MED ORDER — ZOSTER VAC RECOMB ADJUVANTED 50 MCG/0.5ML IM SUSR
0.5000 mL | Freq: Every day | INTRAMUSCULAR | 0 refills | Status: DC
  Filled 2022-12-03: qty 0.5, 1d supply, fill #0

## 2022-12-03 ENCOUNTER — Other Ambulatory Visit (HOSPITAL_BASED_OUTPATIENT_CLINIC_OR_DEPARTMENT_OTHER): Payer: Self-pay

## 2022-12-04 ENCOUNTER — Other Ambulatory Visit (HOSPITAL_BASED_OUTPATIENT_CLINIC_OR_DEPARTMENT_OTHER): Payer: Self-pay

## 2022-12-25 ENCOUNTER — Other Ambulatory Visit (HOSPITAL_BASED_OUTPATIENT_CLINIC_OR_DEPARTMENT_OTHER): Payer: Self-pay

## 2022-12-25 ENCOUNTER — Ambulatory Visit (INDEPENDENT_AMBULATORY_CARE_PROVIDER_SITE_OTHER): Payer: 59 | Admitting: Nurse Practitioner

## 2022-12-25 ENCOUNTER — Encounter: Payer: Self-pay | Admitting: Nurse Practitioner

## 2022-12-25 VITALS — BP 120/73 | HR 80 | Temp 97.9°F | Wt 184.3 lb

## 2022-12-25 DIAGNOSIS — E1165 Type 2 diabetes mellitus with hyperglycemia: Secondary | ICD-10-CM | POA: Diagnosis not present

## 2022-12-25 MED ORDER — TRULICITY 0.75 MG/0.5ML ~~LOC~~ SOAJ
0.7500 mg | SUBCUTANEOUS | 1 refills | Status: DC
  Filled 2022-12-25: qty 2, 28d supply, fill #0
  Filled 2023-01-22: qty 2, 28d supply, fill #1
  Filled 2023-03-03: qty 2, 28d supply, fill #2

## 2022-12-25 NOTE — Assessment & Plan Note (Signed)
Chronic.  Improved from prior.  A1c is well controlled 7.2%.  Continue with Trulicity 0.75mg .  Refills sent today.  If not well controlled can add SGLT2 or change to Rybelsus.  Follow up in 2 months.  Call sooner if concerns arise.

## 2022-12-25 NOTE — Progress Notes (Signed)
BP 120/73   Pulse 80   Temp 97.9 F (36.6 C) (Oral)   Wt 184 lb 4.8 oz (83.6 kg)   LMP 03/09/2019   SpO2 96%   BMI 32.91 kg/m    Subjective:    Patient ID: Cindy Carson, female    DOB: 1971-05-21, 52 y.o.   MRN: [id number]  HPI: Cindy Carson is a 52 y.o. female  Chief Complaint  Patient presents with   Diabetes    Pt states she has an eye exam coming up on 12/31/22   DIABETES Patient states she is doing okay with the Trulicity.  Does make her go to the bathroom more than the Ozempic did.  Metformin caused a lot of GI upset for patient in the past.  Hypoglycemic episodes:no Polydipsia/polyuria: no Visual disturbance: no Chest pain: no Paresthesias: no Glucose Monitoring: no  Accucheck frequency: Not Checking  Fasting glucose:  Post prandial:  Evening:  Before meals: Taking Insulin?: no  Long acting insulin:  Short acting insulin: Blood Pressure Monitoring: not checking Retinal Examination: Not up to Date Foot Exam:  up to date Diabetic Education: Not Completed Pneumovax: Up to Date Influenza: Up to Date Aspirin: no  Patient states she feels like she is having trouble remembering things lately.  She feels like it is too often due to how young she is.  She wants to make sure that it is not dementia related.  Does endorse that she has trouble concentrating on different things.      Relevant past medical, surgical, family and social history reviewed and updated as indicated. Interim medical history since our last visit reviewed. Allergies and medications reviewed and updated.  Review of Systems  Eyes:  Negative for visual disturbance.  Respiratory:  Negative for cough, chest tightness and shortness of breath.   Cardiovascular:  Negative for chest pain, palpitations and leg swelling.  Endocrine: Negative for polydipsia and polyuria.  Neurological:  Negative for dizziness, numbness and headaches.       Memory changes    Per HPI unless specifically indicated above      Objective:    BP 120/73   Pulse 80   Temp 97.9 F (36.6 C) (Oral)   Wt 184 lb 4.8 oz (83.6 kg)   LMP 03/09/2019   SpO2 96%   BMI 32.91 kg/m   Wt Readings from Last 3 Encounters:  12/25/22 184 lb 4.8 oz (83.6 kg)  11/24/22 181 lb 4.8 oz (82.2 kg)  10/09/22 182 lb (82.6 kg)    Physical Exam Vitals and nursing note reviewed.  Constitutional:      General: She is not in acute distress.    Appearance: Normal appearance. She is normal weight. She is not ill-appearing, toxic-appearing or diaphoretic.  HENT:     Head: Normocephalic.     Right Ear: External ear normal.     Left Ear: External ear normal.     Nose: Nose normal.     Mouth/Throat:     Mouth: Mucous membranes are moist.     Pharynx: Oropharynx is clear.  Eyes:     General:        Right eye: No discharge.        Left eye: No discharge.     Extraocular Movements: Extraocular movements intact.     Conjunctiva/sclera: Conjunctivae normal.     Pupils: Pupils are equal, round, and reactive to light.  Cardiovascular:     Rate and Rhythm: Normal rate and regular rhythm.  Heart sounds: No murmur heard. Pulmonary:     Effort: Pulmonary effort is normal. No respiratory distress.     Breath sounds: Normal breath sounds. No wheezing or rales.  Musculoskeletal:     Cervical back: Normal range of motion and neck supple.  Skin:    General: Skin is warm and dry.     Capillary Refill: Capillary refill takes less than 2 seconds.  Neurological:     General: No focal deficit present.     Mental Status: She is alert and oriented to person, place, and time. Mental status is at baseline.  Psychiatric:        Mood and Affect: Mood normal.        Behavior: Behavior normal.        Thought Content: Thought content normal.        Judgment: Judgment normal.     Results for orders placed or performed in visit on 11/24/22  Comp Met (CMET)  Result Value Ref Range   Glucose 177 (H) 70 - 99 mg/dL   BUN 8 6 - 24 mg/dL    Creatinine, Ser 0.82 0.57 - 1.00 mg/dL   eGFR 86 >59 mL/min/1.73   BUN/Creatinine Ratio 10 9 - 23   Sodium 144 134 - 144 mmol/L   Potassium 4.7 3.5 - 5.2 mmol/L   Chloride 105 96 - 106 mmol/L   CO2 25 20 - 29 mmol/L   Calcium 9.3 8.7 - 10.2 mg/dL   Total Protein 6.9 6.0 - 8.5 g/dL   Albumin 4.3 3.8 - 4.9 g/dL   Globulin, Total 2.6 1.5 - 4.5 g/dL   Albumin/Globulin Ratio 1.7 1.2 - 2.2   Bilirubin Total 0.3 0.0 - 1.2 mg/dL   Alkaline Phosphatase 100 44 - 121 IU/L   AST 21 0 - 40 IU/L   ALT 31 0 - 32 IU/L  Lipid Profile  Result Value Ref Range   Cholesterol, Total 136 100 - 199 mg/dL   Triglycerides 94 0 - 149 mg/dL   HDL 54 >39 mg/dL   VLDL Cholesterol Cal 18 5 - 40 mg/dL   LDL Chol Calc (NIH) 64 0 - 99 mg/dL   Chol/HDL Ratio 2.5 0.0 - 4.4 ratio  HgB A1c  Result Value Ref Range   Hgb A1c MFr Bld 7.2 (H) 4.8 - 5.6 %   Est. average glucose Bld gHb Est-mCnc 160 mg/dL      Assessment & Plan:   Problem List Items Addressed This Visit       Endocrine   Type 2 diabetes mellitus with hyperglycemia, without long-term current use of insulin - Primary    Chronic.  Improved from prior.  A1c is well controlled 7.2%.  Continue with Trulicity 0.75mg .  Refills sent today.  If not well controlled can add SGLT2 or change to Rybelsus.  Follow up in 2 months.  Call sooner if concerns arise.       Relevant Medications   Dulaglutide (TRULICITY) [id number] SOPN     Follow up plan: Return in about 2 months (around 02/24/2023) for HTN, HLD, DM2 FU.

## 2022-12-26 ENCOUNTER — Other Ambulatory Visit (HOSPITAL_BASED_OUTPATIENT_CLINIC_OR_DEPARTMENT_OTHER): Payer: Self-pay

## 2022-12-31 DIAGNOSIS — E119 Type 2 diabetes mellitus without complications: Secondary | ICD-10-CM | POA: Diagnosis not present

## 2022-12-31 LAB — HM DIABETES EYE EXAM

## 2023-01-01 ENCOUNTER — Encounter: Payer: Self-pay | Admitting: Nurse Practitioner

## 2023-01-11 ENCOUNTER — Other Ambulatory Visit (HOSPITAL_BASED_OUTPATIENT_CLINIC_OR_DEPARTMENT_OTHER): Payer: Self-pay

## 2023-01-16 ENCOUNTER — Other Ambulatory Visit: Payer: Self-pay

## 2023-02-26 ENCOUNTER — Ambulatory Visit: Payer: 59 | Admitting: Nurse Practitioner

## 2023-03-05 ENCOUNTER — Encounter: Payer: Self-pay | Admitting: Nurse Practitioner

## 2023-03-05 ENCOUNTER — Ambulatory Visit (INDEPENDENT_AMBULATORY_CARE_PROVIDER_SITE_OTHER): Payer: 59 | Admitting: Nurse Practitioner

## 2023-03-05 ENCOUNTER — Other Ambulatory Visit (HOSPITAL_BASED_OUTPATIENT_CLINIC_OR_DEPARTMENT_OTHER): Payer: Self-pay

## 2023-03-05 VITALS — BP 131/80 | HR 83 | Temp 98.6°F | Wt 193.2 lb

## 2023-03-05 DIAGNOSIS — I152 Hypertension secondary to endocrine disorders: Secondary | ICD-10-CM

## 2023-03-05 DIAGNOSIS — E1169 Type 2 diabetes mellitus with other specified complication: Secondary | ICD-10-CM | POA: Diagnosis not present

## 2023-03-05 DIAGNOSIS — F339 Major depressive disorder, recurrent, unspecified: Secondary | ICD-10-CM

## 2023-03-05 DIAGNOSIS — E1159 Type 2 diabetes mellitus with other circulatory complications: Secondary | ICD-10-CM | POA: Diagnosis not present

## 2023-03-05 DIAGNOSIS — D649 Anemia, unspecified: Secondary | ICD-10-CM

## 2023-03-05 DIAGNOSIS — E1165 Type 2 diabetes mellitus with hyperglycemia: Secondary | ICD-10-CM

## 2023-03-05 DIAGNOSIS — Z7985 Long-term (current) use of injectable non-insulin antidiabetic drugs: Secondary | ICD-10-CM

## 2023-03-05 DIAGNOSIS — E785 Hyperlipidemia, unspecified: Secondary | ICD-10-CM | POA: Diagnosis not present

## 2023-03-05 MED ORDER — ATORVASTATIN CALCIUM 20 MG PO TABS
20.0000 mg | ORAL_TABLET | Freq: Every day | ORAL | 1 refills | Status: DC
Start: 2023-03-05 — End: 2023-11-11
  Filled 2023-03-05 – 2023-05-03 (×2): qty 90, 90d supply, fill #0
  Filled 2023-08-23: qty 90, 90d supply, fill #1

## 2023-03-05 MED ORDER — DULOXETINE HCL 30 MG PO CPEP
30.0000 mg | ORAL_CAPSULE | Freq: Every day | ORAL | 1 refills | Status: DC
Start: 2023-03-05 — End: 2023-11-11
  Filled 2023-03-05 – 2023-05-03 (×2): qty 90, 90d supply, fill #0
  Filled 2023-08-05: qty 90, 90d supply, fill #1

## 2023-03-05 MED ORDER — OZEMPIC (0.25 OR 0.5 MG/DOSE) 2 MG/3ML ~~LOC~~ SOPN
PEN_INJECTOR | SUBCUTANEOUS | 3 refills | Status: DC
  Filled 2023-03-05: qty 3, 30d supply, fill #0

## 2023-03-05 MED ORDER — AMLODIPINE BESYLATE 5 MG PO TABS
5.0000 mg | ORAL_TABLET | Freq: Every day | ORAL | 1 refills | Status: DC
Start: 2023-03-05 — End: 2023-11-11
  Filled 2023-03-05 – 2023-05-03 (×2): qty 90, 90d supply, fill #0
  Filled 2023-08-23: qty 90, 90d supply, fill #1

## 2023-03-05 NOTE — Patient Instructions (Signed)
Black cohosh- menopausal symptoms

## 2023-03-05 NOTE — Assessment & Plan Note (Signed)
Chronic.  Controlled.  Continue with current medication regimen of Amlodipine 5mg  daily.  Refills sent today.  Labs ordered today.  Return to clinic in 4 months for reevaluation.  Call sooner if concerns arise.

## 2023-03-05 NOTE — Assessment & Plan Note (Signed)
Labs ordered at visit today.  Will make recommendations based on lab results.   

## 2023-03-05 NOTE — Assessment & Plan Note (Signed)
Chronic.  Improved from prior.  A1c is well controlled 7.2%.  Not tolerating Trulicity well due to side effects of constipation.  Will change back to Ozempic 0.5mg  weekly.  Refills sent today.  Follow up in 4 months.  Call sooner if concerns arise.

## 2023-03-05 NOTE — Assessment & Plan Note (Signed)
Chronic.  Controlled.  Continue with current medication regimen of Atorvastatin daily.  Refills sent today.  Labs ordered today.  Return to clinic in 6 months for reevaluation.  Call sooner if concerns arise.  ° °

## 2023-03-05 NOTE — Assessment & Plan Note (Signed)
Chronic. Controlled on Cymbalta 30mg  daily.  Refills sent today.  Not able to tolerate increased dose.  Follow up in 4 months.  Call sooner if concerns arise.

## 2023-03-05 NOTE — Progress Notes (Signed)
BP 131/80   Pulse 83   Temp 98.6 F (37 C) (Oral)   Wt 193 lb 3.2 oz (87.6 kg)   LMP 03/09/2019   SpO2 98%   BMI 34.50 kg/m    Subjective:    Patient ID: Cindy Carson, female    DOB: [birth year], 52 y.o.   MRN: [id number]  HPI: Cindy Carson is a 52 y.o. female  Chief Complaint  Patient presents with   Depression   Diabetes   Hyperlipidemia   Hypertension   HYPERTENSION without Chronic Kidney Disease Hypertension status: controlled  Satisfied with current treatment? yes Duration of hypertension: years BP monitoring frequency:  not checking BP range:  BP medication side effects:  no Medication compliance: excellent compliance Previous BP meds:amlodipine Aspirin: no Recurrent headaches: no Visual changes: no Palpitations: no Dyspnea: no Chest pain: no Lower extremity edema: no Dizzy/lightheaded: no  DIABETES Patient states she is having constipation with the Trulicity.  She did not have that with Ozempic.   Hypoglycemic episodes:no Polydipsia/polyuria: no Visual disturbance: no Chest pain: no Paresthesias: no Glucose Monitoring: no  Accucheck frequency: Not Checking  Fasting glucose:  Post prandial:  Evening:  Before meals: Taking Insulin?: no  Long acting insulin:  Short acting insulin: Blood Pressure Monitoring: not checking Retinal Examination: Not up to Date Foot Exam:  up to date Diabetic Education: Not Completed Pneumovax: Up to Date Influenza: Up to Date Aspirin: no  DEPRESSION Patient states she is doing well.  Denies concerns at visit today. Continues with the Duloxetine 30mg .  She did not tolerate increasing the dose of Duloxetine was too strong.     Satisfied with current treatment?: no Symptom severity: severe  Duration of current treatment : chronic Side effects: no Medication compliance: good compliance Psychotherapy/counseling: no in the past Previous psychiatric medications: cymbalta unsure if she has been on other medications in the  past  Depressed mood: yes Anxious mood: yes Anhedonia: yes Significant weight loss or gain: no Insomnia: yes hard to stay asleep Fatigue: yes Feelings of worthlessness or guilt: yes Impaired concentration/indecisiveness: yes reports concerns for memory troubles  Suicidal ideations: no Hopelessness: yes Relevant past medical, surgical, family and social history reviewed and updated as indicated. Interim medical history since our last visit reviewed. Allergies and medications reviewed and updated.  Review of Systems  Eyes:  Negative for visual disturbance.  Respiratory:  Negative for cough, chest tightness and shortness of breath.   Cardiovascular:  Negative for chest pain, palpitations and leg swelling.  Gastrointestinal:  Positive for constipation.  Endocrine: Negative for polydipsia and polyuria.  Neurological:  Negative for dizziness, numbness and headaches.  Psychiatric/Behavioral:  Positive for dysphoric mood. Negative for suicidal ideas.     Per HPI unless specifically indicated above     Objective:    BP 131/80   Pulse 83   Temp 98.6 F (37 C) (Oral)   Wt 193 lb 3.2 oz (87.6 kg)   LMP 03/09/2019   SpO2 98%   BMI 34.50 kg/m   Wt Readings from Last 3 Encounters:  03/05/23 193 lb 3.2 oz (87.6 kg)  12/25/22 184 lb 4.8 oz (83.6 kg)  11/24/22 181 lb 4.8 oz (82.2 kg)    Physical Exam Vitals and nursing note reviewed.  Constitutional:      General: She is not in acute distress.    Appearance: Normal appearance. She is normal weight. She is not ill-appearing, toxic-appearing or diaphoretic.  HENT:     Head: Normocephalic.  Right Ear: External ear normal.     Left Ear: External ear normal.     Nose: Nose normal.     Mouth/Throat:     Mouth: Mucous membranes are moist.     Pharynx: Oropharynx is clear.  Eyes:     General:        Right eye: No discharge.        Left eye: No discharge.     Extraocular Movements: Extraocular movements intact.      Conjunctiva/sclera: Conjunctivae normal.     Pupils: Pupils are equal, round, and reactive to light.  Cardiovascular:     Rate and Rhythm: Normal rate and regular rhythm.     Heart sounds: No murmur heard. Pulmonary:     Effort: Pulmonary effort is normal. No respiratory distress.     Breath sounds: Normal breath sounds. No wheezing or rales.  Musculoskeletal:     Cervical back: Normal range of motion and neck supple.  Skin:    General: Skin is warm and dry.     Capillary Refill: Capillary refill takes less than 2 seconds.  Neurological:     General: No focal deficit present.     Mental Status: She is alert and oriented to person, place, and time. Mental status is at baseline.  Psychiatric:        Mood and Affect: Mood normal.        Behavior: Behavior normal.        Thought Content: Thought content normal.        Judgment: Judgment normal.     Results for orders placed or performed in visit on 01/01/23  HM DIABETES EYE EXAM  Result Value Ref Range   HM Diabetic Eye Exam No Retinopathy No Retinopathy      Assessment & Plan:   Problem List Items Addressed This Visit       Cardiovascular and Mediastinum   Hypertension complicating diabetes (HCC) - Primary    Chronic.  Controlled.  Continue with current medication regimen of Amlodipine 5mg  daily.  Refills sent today.  Labs ordered today.  Return to clinic in 4 months for reevaluation.  Call sooner if concerns arise.        Relevant Medications   amLODipine (NORVASC) 5 MG tablet   atorvastatin (LIPITOR) 20 MG tablet   Semaglutide,0.25 or 0.5MG /DOS, (OZEMPIC, 0.25 OR 0.5 MG/DOSE,) 2 MG/1.5ML SOPN   Other Relevant Orders   Comp Met (CMET)     Endocrine   Hyperlipidemia associated with type 2 diabetes mellitus (HCC)    Chronic.  Controlled.  Continue with current medication regimen of Atorvastatin daily.  Refills sent today.  Labs ordered today.  Return to clinic in 6 months for reevaluation.  Call sooner if concerns arise.         Relevant Medications   amLODipine (NORVASC) 5 MG tablet   atorvastatin (LIPITOR) 20 MG tablet   Semaglutide,0.25 or 0.5MG /DOS, (OZEMPIC, 0.25 OR 0.5 MG/DOSE,) 2 MG/1.5ML SOPN   Other Relevant Orders   Lipid Profile   Type 2 diabetes mellitus with hyperglycemia, without long-term current use of insulin (HCC)    Chronic.  Improved from prior.  A1c is well controlled 7.2%.  Not tolerating Trulicity well due to side effects of constipation.  Will change back to Ozempic 0.5mg  weekly.  Refills sent today.  Follow up in 4 months.  Call sooner if concerns arise.       Relevant Medications   atorvastatin (LIPITOR) 20 MG tablet  Semaglutide,0.25 or 0.5MG /DOS, (OZEMPIC, 0.25 OR 0.5 MG/DOSE,) 2 MG/1.5ML SOPN   Other Relevant Orders   HgB A1c     Other   Anemia    Labs ordered at visit today.  Will make recommendations based on lab results.        Relevant Orders   CBC w/Diff   Depression, recurrent (HCC)    Chronic. Controlled on Cymbalta 30mg  daily.  Refills sent today.  Not able to tolerate increased dose.  Follow up in 4 months.  Call sooner if concerns arise.       Relevant Medications   DULoxetine (CYMBALTA) 30 MG capsule   Other Visit Diagnoses     Hyperlipidemia, unspecified hyperlipidemia type       Relevant Medications   amLODipine (NORVASC) 5 MG tablet   atorvastatin (LIPITOR) 20 MG tablet        Follow up plan: Return in about 4 months (around 07/05/2023) for HTN, HLD, DM2 FU.

## 2023-03-06 LAB — CBC WITH DIFFERENTIAL/PLATELET
Basophils Absolute: 0.1 10*3/uL (ref 0.0–0.2)
Basos: 1 %
EOS (ABSOLUTE): 0.2 10*3/uL (ref 0.0–0.4)
Eos: 3 %
Hematocrit: 40.4 % (ref 34.0–46.6)
Hemoglobin: 13.4 g/dL (ref 11.1–15.9)
Immature Grans (Abs): 0 10*3/uL (ref 0.0–0.1)
Immature Granulocytes: 0 %
Lymphocytes Absolute: 1.9 10*3/uL (ref 0.7–3.1)
Lymphs: 34 %
MCH: 27.2 pg (ref 26.6–33.0)
MCHC: 33.2 g/dL (ref 31.5–35.7)
MCV: 82 fL (ref 79–97)
Monocytes Absolute: 0.4 10*3/uL (ref 0.1–0.9)
Monocytes: 8 %
Neutrophils Absolute: 2.9 10*3/uL (ref 1.4–7.0)
Neutrophils: 54 %
Platelets: 373 10*3/uL (ref 150–450)
RBC: 4.92 x10E6/uL (ref 3.77–5.28)
RDW: 13.2 % (ref 11.7–15.4)
WBC: 5.4 10*3/uL (ref 3.4–10.8)

## 2023-03-06 LAB — COMPREHENSIVE METABOLIC PANEL
ALT: 18 IU/L (ref 0–32)
AST: 15 IU/L (ref 0–40)
Albumin/Globulin Ratio: 1.4
Albumin: 4.2 g/dL (ref 3.8–4.9)
Alkaline Phosphatase: 119 IU/L (ref 44–121)
BUN/Creatinine Ratio: 16 (ref 9–23)
BUN: 13 mg/dL (ref 6–24)
Bilirubin Total: 0.3 mg/dL (ref 0.0–1.2)
CO2: 26 mmol/L (ref 20–29)
Calcium: 9.5 mg/dL (ref 8.7–10.2)
Chloride: 103 mmol/L (ref 96–106)
Creatinine, Ser: 0.8 mg/dL (ref 0.57–1.00)
Globulin, Total: 3 g/dL (ref 1.5–4.5)
Glucose: 123 mg/dL — ABNORMAL HIGH (ref 70–99)
Potassium: 4.7 mmol/L (ref 3.5–5.2)
Sodium: 141 mmol/L (ref 134–144)
Total Protein: 7.2 g/dL (ref 6.0–8.5)
eGFR: 88 mL/min/{1.73_m2} (ref >59)

## 2023-03-06 LAB — HEMOGLOBIN A1C
Est. average glucose Bld gHb Est-mCnc: 183 mg/dL
Hgb A1c MFr Bld: 8 % — ABNORMAL HIGH (ref 4.8–5.6)

## 2023-03-06 LAB — LIPID PANEL
Chol/HDL Ratio: 2.8 ratio (ref 0.0–4.4)
Cholesterol, Total: 128 mg/dL (ref 100–199)
HDL: 46 mg/dL (ref >39)
LDL Chol Calc (NIH): 62 mg/dL (ref 0–99)
Triglycerides: 107 mg/dL (ref 0–149)
VLDL Cholesterol Cal: 20 mg/dL (ref 5–40)

## 2023-03-06 NOTE — Progress Notes (Signed)
Hi Cindy Carson. It was nice to see you yesterday.  Your lab work looks good.  Your A1c did increase to 8.0.  I went ahead and sent in the Ozempic to get that restarted.  No other concerns at this time. Continue with your current medication regimen.  Follow up as discussed.  Please let me know if you have any questions.

## 2023-04-17 ENCOUNTER — Ambulatory Visit (HOSPITAL_BASED_OUTPATIENT_CLINIC_OR_DEPARTMENT_OTHER): Payer: Commercial Managed Care - PPO | Admitting: Student

## 2023-04-20 ENCOUNTER — Ambulatory Visit (HOSPITAL_BASED_OUTPATIENT_CLINIC_OR_DEPARTMENT_OTHER): Payer: Commercial Managed Care - PPO | Admitting: Student

## 2023-04-21 ENCOUNTER — Ambulatory Visit (INDEPENDENT_AMBULATORY_CARE_PROVIDER_SITE_OTHER): Payer: 59 | Admitting: Student

## 2023-04-21 ENCOUNTER — Encounter (HOSPITAL_BASED_OUTPATIENT_CLINIC_OR_DEPARTMENT_OTHER): Payer: Self-pay | Admitting: Student

## 2023-04-21 DIAGNOSIS — G8929 Other chronic pain: Secondary | ICD-10-CM | POA: Diagnosis not present

## 2023-04-21 DIAGNOSIS — M25512 Pain in left shoulder: Secondary | ICD-10-CM | POA: Diagnosis not present

## 2023-04-21 NOTE — Progress Notes (Signed)
Chief Complaint: Left shoulder pain     History of Present Illness:    Cindy Carson is a 52 y.o. female presenting for evaluation second opinion of left shoulder pain.  This began 1 year ago after she sustained a fall on 04/02/22.  She has been followed by [facility] for previous workup and treatment.  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 She had moderate AC joint arthrosis and subacromial bursitis. There is no evidence of a labral tear" per report.  She completed many sessions of physical therapy and was recommended for follow-up with MD after failing to progress.  She received an intra-articular left shoulder injection on 10/22/2022 and MD was also considering epidural steroid injection at that time for pre-existing cervical issues.  Patient states that she got some good relief from this injection, but feels like it is beginning to wear off.  She continues to have moderate pain and difficulty with range of motion, but also reports that it is worse while sitting at her desk at work.  Denies any night pain, numbness, or tingling.  Takes ibuprofen occasionally for pain.  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   Surgical History:   None  PMH/PSH/Family History/Social History/Meds/Allergies:    Past Medical History:  Diagnosis Date   Anemia 12/09/2018   Chronic low back pain    Chronic neck pain    Diabetes type 2, uncontrolled    diagnosed in 2019   Hyperlipidemia 08/01/2021   Hypertension complicating diabetes (HCC)    Neck pain 07/11/2021   Obesity 12/09/2018   OSA (obstructive sleep apnea)    sleep study done in [place] in 2019 per pt   Rash 01/28/2022   Screening for colon cancer 08/01/2021   Past Surgical History:  Procedure  Laterality Date   COLONOSCOPY     COLONOSCOPY N/A 09/03/2021   Procedure: COLONOSCOPY;  Surgeon: Midge Minium, MD;  Location: [facility] ENDOSCOPY;  Service: Endoscopy;  Laterality: N/A;   TUBAL LIGATION     Social History   Socioeconomic History   Marital status: Legally Separated    Spouse name: Not on file   Number of children: Not on file   Years of education: Not on file   Highest education level: Associate degree: occupational, Scientist, product/process development, or vocational program  Occupational History   Not on file  Tobacco Use   Smoking status: Never   Smokeless tobacco: Never  Vaping Use   Vaping status: Never Used  Substance and Sexual Activity   Alcohol use: Never   Drug use: Never   Sexual activity: Not Currently    Partners: Male    Birth control/protection: Surgical, Post-menopausal    Comment: BTL, First IC <16 y/o, >5 Partners, Hx of RPR, GC  Other Topics Concern   Not on file  Social History Narrative   Not on file   Social Determinants of Health   Financial Resource Strain: Medium Risk (12/21/2022)   Overall Financial Resource Strain (CARDIA)    Difficulty of Paying Living Expenses: Somewhat hard  Food Insecurity: Food Insecurity Present (12/21/2022)   Hunger Vital Sign    Worried About Running Out of Food in the  Last Year: Sometimes true    Ran Out of Food in the Last Year: Sometimes true  Transportation Needs: No Transportation Needs (12/21/2022)   PRAPARE - Administrator, Civil Service (Medical): No    Lack of Transportation (Non-Medical): No  Physical Activity: Inactive (12/21/2022)   Exercise Vital Sign    Days of Exercise per Week: 2 days    Minutes of Exercise per Session: 0 min  Stress: No Stress Concern Present (12/21/2022)   [employer] of Occupational Health - Occupational Stress Questionnaire    Feeling of Stress : Only a little  Social Connections: Socially Isolated (12/21/2022)   Social Connection and Isolation Panel [NHANES]    Frequency of  Communication with Friends and Family: More than three times a week    Frequency of Social Gatherings with Friends and Family: Not on file    Attends Religious Services: Never    Database administrator or Organizations: No    Attends Engineer, structural: Not on file    Marital Status: Separated   Family History  Problem Relation Age of Onset   Varicose Veins Father    Hypertension Sister    Diabetes Sister    Diabetes Sister    Heart murmur Brother    Cancer Maternal Grandmother    Allergies  Allergen Reactions   Nsaids Nausea Only    nausea   Current Outpatient Medications  Medication Sig Dispense Refill   amLODipine (NORVASC) 5 MG tablet Take 1 tablet by mouth once daily 90 tablet 1   atorvastatin (LIPITOR) 20 MG tablet Take 1 tablet by mouth once daily 90 tablet 1   DULoxetine (CYMBALTA) 30 MG capsule Take 1 capsule (30 mg total) by mouth daily. 90 capsule 1   No current facility-administered medications for this visit.   No results found.  Review of Systems:   A ROS was performed including pertinent positives and negatives as documented in the HPI.  Physical Exam :   Constitutional: NAD and appears stated age Neurological: Alert and oriented Psych: Appropriate affect and cooperative Last menstrual period 03/09/2019.   Comprehensive Musculoskeletal Exam:    Musculoskeletal Exam    Inspection Right Left  Skin No atrophy or winging No atrophy or winging  Palpation    Tenderness None None  Range of Motion    Flexion (passive) 170 160  Flexion (active) 160 150  Abduction 140 140  ER at the side 60 60  Can reach behind back to L4 L4 with discomfort  Strength     5/5 4/5  Special Tests    Pseudoparalytic No No  Neurologic    Fires PIN, radial, median, ulnar, musculocutaneous, axillary, suprascapular, long thoracic, and spinal accessory innervated muscles. No abnormal sensibility  Vascular/Lymphatic    Radial Pulse 2+ 2+  Cervical Exam    Patient  has symmetric cervical range of motion with negative Spurling's test.  Special Test: Positive left empty can and Neer/Hawkins. Negative belly press      Imaging:   Xray review from 04/02/2022 (left shoulder 3 views): Negative    I personally reviewed and interpreted the radiographs.   Assessment:   52 y.o. female with chronic right shoulder pain ongoing for the last year.  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  Cortisone injections have given her some relief but she has overall failed to progress with physical therapy.  At this point I have recommended  close follow-up with Dr. Steward Drone to review previous imaging and discussion of treatment options to determine if she would be a potential surgical candidate.  She will work on obtaining MRI images to bring with her.  Will hold off on repeating any injections today.  Plan :    - Return to clinic in 2 weeks to review MRI with Dr. Steward Drone and discuss treatment options     I personally saw and evaluated the patient, and participated in the management and treatment plan.  Hazle Nordmann, PA-C Orthopedics  This document was dictated using Conservation officer, historic buildings. A reasonable attempt at proof reading has been made to minimize errors.

## 2023-05-03 ENCOUNTER — Other Ambulatory Visit (HOSPITAL_BASED_OUTPATIENT_CLINIC_OR_DEPARTMENT_OTHER): Payer: Self-pay

## 2023-05-06 ENCOUNTER — Ambulatory Visit (HOSPITAL_BASED_OUTPATIENT_CLINIC_OR_DEPARTMENT_OTHER): Payer: 59 | Admitting: Orthopaedic Surgery

## 2023-05-08 ENCOUNTER — Ambulatory Visit (HOSPITAL_BASED_OUTPATIENT_CLINIC_OR_DEPARTMENT_OTHER): Payer: 59 | Admitting: Orthopaedic Surgery

## 2023-05-08 DIAGNOSIS — M75121 Complete rotator cuff tear or rupture of right shoulder, not specified as traumatic: Secondary | ICD-10-CM

## 2023-05-08 DIAGNOSIS — M25512 Pain in left shoulder: Secondary | ICD-10-CM

## 2023-05-08 DIAGNOSIS — G8929 Other chronic pain: Secondary | ICD-10-CM | POA: Diagnosis not present

## 2023-05-08 NOTE — Progress Notes (Signed)
Chief Complaint: Left shoulder pain        History of Present Illness:    05/08/2023: Presents today for follow-up of the left shoulder.  She is experiencing pain and persistent overhead activity.  She did not get significant relief from her injection or physical therapy.   Elysia Eben is a 52 y.o. female presenting for evaluation second opinion of left shoulder pain.  This began 1 year ago after she sustained a fall on 04/02/22.  She has been followed by [facility] for previous workup and treatment.  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 She had moderate AC joint arthrosis and subacromial bursitis. There is no evidence of a labral tear" per report.  She completed many sessions of physical therapy and was recommended for follow-up with MD after failing to progress.  She received an intra-articular left shoulder injection on 10/22/2022 and MD was also considering epidural steroid injection at that time for pre-existing cervical issues.  Patient states that she got some good relief from this injection, but feels like it is beginning to wear off.  She continues to have moderate pain and difficulty with range of motion, but also reports that it is worse while sitting at her desk at work.  Denies any night pain, numbness, or tingling.  Takes ibuprofen occasionally for pain.  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     Surgical History:   None   PMH/PSH/Family History/Social History/Meds/Allergies:         Past Medical History:  Diagnosis Date   Anemia 12/09/2018   Chronic low back pain     Chronic neck pain     Diabetes type 2, uncontrolled      diagnosed in 2019   Hyperlipidemia 08/01/2021   Hypertension complicating diabetes (HCC)     Neck pain 07/11/2021    Obesity 12/09/2018   OSA (obstructive sleep apnea)      sleep study done in [place] in 2019 per pt   Rash 01/28/2022   Screening for colon cancer 08/01/2021             Past Surgical History:  Procedure Laterality Date   COLONOSCOPY       COLONOSCOPY N/A 09/03/2021    Procedure: COLONOSCOPY;  Surgeon: Midge Minium, MD;  Location[facility] ENDOSCOPY;  Service: Endoscopy;  Laterality: N/A;   TUBAL LIGATION            Social History         Socioeconomic History   Marital status: Legally Separated      Spouse name: Not on file   Number of children: Not on file   Years of education: Not on file   Highest education level: Associate degree: occupational, Scientist, product/process development, or vocational program  Occupational History   Not on file  Tobacco Use   Smoking status: Never   Smokeless tobacco: Never  Vaping Use   Vaping status: Never Used  Substance and Sexual Activity   Alcohol use: Never   Drug use: Never   Sexual activity: Not Currently      Partners: Male      Birth control/protection: Surgical, Post-menopausal      Comment: BTL, First IC <16 y/o, >5 Partners, Hx of RPR, GC  Other Topics Concern   Not on file  Social History Narrative   Not on file    Social Determinants of Health        Financial Resource Strain: Medium Risk (12/21/2022)    Overall Financial Resource Strain (CARDIA)     Difficulty of Paying Living Expenses: Somewhat hard  Food Insecurity: Food Insecurity Present (12/21/2022)    Hunger Vital Sign     Worried About Running Out of Food in the Last Year: Sometimes true     Ran Out of Food in the Last Year: Sometimes true  Transportation Needs: No Transportation Needs (12/21/2022)    PRAPARE - Therapist, art (Medical): No     Lack of Transportation (Non-Medical): No  Physical Activity: Inactive (12/21/2022)    Exercise Vital Sign     Days of Exercise per Week: 2 days     Minutes of Exercise per Session: 0 min  Stress: No Stress Concern  Present (12/21/2022)    [employer] of Occupational Health - Occupational Stress Questionnaire     Feeling of Stress : Only a little  Social Connections: Socially Isolated (12/21/2022)    Social Connection and Isolation Panel [NHANES]     Frequency of Communication with Friends and Family: More than three times a week     Frequency of Social Gatherings with Friends and Family: Not on file     Attends Religious Services: Never     Database administrator or Organizations: No     Attends Engineer, structural: Not on file     Marital Status: Separated         Family History  Problem Relation Age of Onset   Varicose Veins Father     Hypertension Sister     Diabetes Sister     Diabetes Sister     Heart murmur Brother     Cancer Maternal Grandmother          Allergies       Allergies  Allergen Reactions   Nsaids Nausea Only      nausea            Current Outpatient Medications  Medication Sig Dispense Refill   amLODipine (NORVASC) 5 MG tablet Take 1 tablet by mouth once daily 90 tablet 1   atorvastatin (LIPITOR) 20 MG tablet Take 1 tablet by mouth once daily 90 tablet 1   DULoxetine (CYMBALTA) 30 MG capsule Take 1 capsule (30 mg total) by mouth daily. 90 capsule 1      No current facility-administered medications for this visit.      Imaging Results (Last 48 hours)  No results found.     Review of Systems:   A ROS was performed including pertinent positives and negatives as documented in the HPI.   Physical Exam :   Constitutional: NAD and appears stated age Neurological: Alert and oriented Psych: Appropriate affect and cooperative Last menstrual period 03/09/2019.    Comprehensive Musculoskeletal Exam:     Musculoskeletal Exam      Inspection Right Left  Skin No atrophy or winging No atrophy or winging  Palpation      Tenderness None None  Range of Motion      Flexion (passive) 170 160  Flexion (active) 160 150  Abduction 140 140  ER at the  side 60 60  Can reach behind back to L4 L4 with discomfort  Strength        5/5 4/5  Special Tests  Pseudoparalytic No No  Neurologic      Fires PIN, radial, median, ulnar, musculocutaneous, axillary, suprascapular, long thoracic, and spinal accessory innervated muscles. No abnormal sensibility  Vascular/Lymphatic      Radial Pulse 2+ 2+  Cervical Exam      Patient has symmetric cervical range of motion with negative Spurling's test.  Special Test: Positive left empty can and Neer/Hawkins. Negative belly press        Imaging:   Xray review from 04/02/2022 (left shoulder 3 views): Negative       I personally reviewed and interpreted the radiographs.     Assessment:   52 y.o. female with chronic right shoulder pain ongoing for the last year.  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  Fortunately we were not able to visualize the MRI today.  Given the fact that she has failed an injection as well as physical therapy I do believe she will ultimately require arthroscopic intervention with rotator cuff repair.  We will plan for a video visit where I can review her MRI findings at the next appointment Plan :     -Plan for video visit so we can discuss MRI findings         I personally saw and evaluated the patient, and participated in the management and treatment plan.                       Huel Cote, MD Attending Physician, Orthopedic Surgery  This document was dictated using Dragon voice recognition software. A reasonable attempt at proof reading has been made to minimize errors.

## 2023-05-18 ENCOUNTER — Ambulatory Visit (HOSPITAL_BASED_OUTPATIENT_CLINIC_OR_DEPARTMENT_OTHER): Payer: Self-pay | Admitting: Orthopaedic Surgery

## 2023-05-18 ENCOUNTER — Other Ambulatory Visit (HOSPITAL_BASED_OUTPATIENT_CLINIC_OR_DEPARTMENT_OTHER): Payer: Self-pay

## 2023-05-18 ENCOUNTER — Telehealth (INDEPENDENT_AMBULATORY_CARE_PROVIDER_SITE_OTHER): Payer: 59 | Admitting: Orthopaedic Surgery

## 2023-05-18 DIAGNOSIS — M75122 Complete rotator cuff tear or rupture of left shoulder, not specified as traumatic: Secondary | ICD-10-CM | POA: Diagnosis not present

## 2023-05-18 MED ORDER — OXYCODONE HCL 5 MG PO TABS
5.0000 mg | ORAL_TABLET | ORAL | 0 refills | Status: DC | PRN
  Filled 2023-05-18: qty 10, 2d supply, fill #0

## 2023-05-18 MED ORDER — ACETAMINOPHEN 500 MG PO TABS
500.0000 mg | ORAL_TABLET | Freq: Three times a day (TID) | ORAL | 0 refills | Status: AC
  Filled 2023-05-18: qty 30, 10d supply, fill #0

## 2023-05-18 MED ORDER — ASPIRIN 325 MG PO TBEC
325.0000 mg | DELAYED_RELEASE_TABLET | Freq: Every day | ORAL | 0 refills | Status: DC
  Filled 2023-05-18: qty 30, 30d supply, fill #0

## 2023-05-18 NOTE — Progress Notes (Signed)
Chief Complaint: Left shoulder pain        History of Present Illness:   Provider Location: [address], [place], [place] [address] Patient location: [address]  [place] [address]  Visit type: MyChart video visit  05/18/2023: Presents today for MRI video visit of the left shoulder.  At this time she is still having persistent pain with difficulty laying directly on the side.   Cindy Carson is a 52 y.o. female presenting for evaluation second opinion of left shoulder pain.  This began 1 year ago after she sustained a fall on 04/02/22.  She has been followed by [facility] for previous workup and treatment.  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 She had moderate AC joint arthrosis and subacromial bursitis. There is no evidence of a labral tear" per report.  She completed many sessions of physical therapy and was recommended for follow-up with MD after failing to progress.  She received an intra-articular left shoulder injection on 10/22/2022 and MD was also considering epidural steroid injection at that time for pre-existing cervical issues.  Patient states that she got some good relief from this injection, but feels like it is beginning to wear off.  She continues to have moderate pain and difficulty with range of motion, but also reports that it is worse while sitting at her desk at work.  Denies any night pain, numbness, or tingling.  Takes ibuprofen occasionally for pain.  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     Surgical History:   None   PMH/PSH/Family History/Social History/Meds/Allergies:         Past Medical History:  Diagnosis Date   Anemia 12/09/2018   Chronic low back pain     Chronic neck pain     Diabetes  type 2, uncontrolled      diagnosed in 2019   Hyperlipidemia 08/01/2021   Hypertension complicating diabetes (HCC)     Neck pain 07/11/2021   Obesity 12/09/2018   OSA (obstructive sleep apnea)      sleep study done in [place] in 2019 per pt   Rash 01/28/2022   Screening for colon cancer 08/01/2021             Past Surgical History:  Procedure Laterality Date   COLONOSCOPY       COLONOSCOPY N/A 09/03/2021    Procedure: COLONOSCOPY;  Surgeon: Midge Minium, MD;  Location: [facility] ENDOSCOPY;  Service: Endoscopy;  Laterality: N/A;   TUBAL LIGATION            Social History         Socioeconomic History   Marital status: Legally Separated      Spouse name: Not on file   Number of children: Not on file   Years of education: Not on file   Highest education level: Associate degree: occupational, Scientist, product/process development, or vocational program  Occupational History   Not on file  Tobacco Use   Smoking status: Never   Smokeless tobacco: Never  Vaping Use   Vaping status: Never Used  Substance and Sexual Activity   Alcohol use: Never   Drug use: Never   Sexual activity: Not Currently      Partners: Male      Birth control/protection: Surgical, Post-menopausal  Comment: BTL, First IC <16 y/o, >5 Partners, Hx of RPR, GC  Other Topics Concern   Not on file  Social History Narrative   Not on file    Social Determinants of Health        Financial Resource Strain: Medium Risk (12/21/2022)    Overall Financial Resource Strain (CARDIA)     Difficulty of Paying Living Expenses: Somewhat hard  Food Insecurity: Food Insecurity Present (12/21/2022)    Hunger Vital Sign     Worried About Running Out of Food in the Last Year: Sometimes true     Ran Out of Food in the Last Year: Sometimes true  Transportation Needs: No Transportation Needs (12/21/2022)    PRAPARE - Therapist, art (Medical): No     Lack of Transportation (Non-Medical): No  Physical Activity: Inactive  (12/21/2022)    Exercise Vital Sign     Days of Exercise per Week: 2 days     Minutes of Exercise per Session: 0 min  Stress: No Stress Concern Present (12/21/2022)    [employer] of Occupational Health - Occupational Stress Questionnaire     Feeling of Stress : Only a little  Social Connections: Socially Isolated (12/21/2022)    Social Connection and Isolation Panel [NHANES]     Frequency of Communication with Friends and Family: More than three times a week     Frequency of Social Gatherings with Friends and Family: Not on file     Attends Religious Services: Never     Database administrator or Organizations: No     Attends Engineer, structural: Not on file     Marital Status: Separated         Family History  Problem Relation Age of Onset   Varicose Veins Father     Hypertension Sister     Diabetes Sister     Diabetes Sister     Heart murmur Brother     Cancer Maternal Grandmother          Allergies       Allergies  Allergen Reactions   Nsaids Nausea Only      nausea            Current Outpatient Medications  Medication Sig Dispense Refill   amLODipine (NORVASC) 5 MG tablet Take 1 tablet by mouth once daily 90 tablet 1   atorvastatin (LIPITOR) 20 MG tablet Take 1 tablet by mouth once daily 90 tablet 1   DULoxetine (CYMBALTA) 30 MG capsule Take 1 capsule (30 mg total) by mouth daily. 90 capsule 1      No current facility-administered medications for this visit.      Imaging Results (Last 48 hours)  No results found.     Review of Systems:   A ROS was performed including pertinent positives and negatives as documented in the HPI.   Physical Exam :   Constitutional: NAD and appears stated age Neurological: Alert and oriented Psych: Appropriate affect and cooperative Last menstrual period 03/09/2019.    Comprehensive Musculoskeletal Exam:     Musculoskeletal Exam      Inspection Right Left  Skin No atrophy or winging No atrophy or winging   Palpation      Tenderness None None  Range of Motion      Flexion (passive) 170 160  Flexion (active) 160 150  Abduction 140 140  ER at the side 60 60  Can reach behind back to L4  L4 with discomfort  Strength        5/5 4/5  Special Tests      Pseudoparalytic No No  Neurologic      Fires PIN, radial, median, ulnar, musculocutaneous, axillary, suprascapular, long thoracic, and spinal accessory innervated muscles. No abnormal sensibility  Vascular/Lymphatic      Radial Pulse 2+ 2+  Cervical Exam      Patient has symmetric cervical range of motion with negative Spurling's test.  Special Test: Positive left empty can and Neer/Hawkins. Negative belly press        Imaging:   Xray review from 04/02/2022 (left shoulder 3 views): Negative   MRI left shoulder: Full-thickness tearing of the supraspinatus with healthy appearing muscle belly and no evidence of retraction   I personally reviewed and interpreted the radiographs.     Assessment:   52 y.o. female with chronic right shoulder pain ongoing for the last year.  On visit today there does not appear to be a full-thickness rotator cuff supraspinatus tear of the left shoulder.  At this time she has trialed physical therapy as well as an injection.  She does not want want to do an additional injection as she is still having persistent pain and she did plateau but was not able to relieve her pain and disability from physical therapy.  As result we did discuss arthroscopic options.  I did specifically discuss that I do believe she would benefit from left shoulder arthroscopy with rotator cuff repair.  We did discuss about the rehab associated with this.  After discussions of the risks as well as the limitations she has elected for left shoulder arthroscopy with rotator cuff repair Plan :     -Plan for left shoulder arthroscopy with rotator cuff repair   After a lengthy discussion of treatment options, including risks, benefits,  alternatives, complications of surgical and nonsurgical conservative options, the patient elected surgical repair.   The patient  is aware of the material risks  and complications including, but not limited to injury to adjacent structures, neurovascular injury, infection, numbness, bleeding, implant failure, thermal burns, stiffness, persistent pain, failure to heal, disease transmission from allograft, need for further surgery, dislocation, anesthetic risks, blood clots, risks of death,and others. The probabilities of surgical success and failure discussed with patient given their particular co-morbidities.The time and nature of expected rehabilitation and recovery was discussed.The patient's questions were all answered preoperatively.  No barriers to understanding were noted. I explained the natural history of the disease process and Rx rationale.  I explained to the patient what I considered to be reasonable expectations given their personal situation.  The final treatment plan was arrived at through a shared patient decision making process model.          I personally saw and evaluated the patient, and participated in the management and treatment plan.                       Huel Cote, MD Attending Physician, Orthopedic Surgery  This document was dictated using Dragon voice recognition software. A reasonable attempt at proof reading has been made to minimize errors.

## 2023-05-28 ENCOUNTER — Telehealth (HOSPITAL_BASED_OUTPATIENT_CLINIC_OR_DEPARTMENT_OTHER): Payer: Self-pay | Admitting: Orthopaedic Surgery

## 2023-05-28 NOTE — Telephone Encounter (Signed)
Cindy Carson had a question about her surgery I think she called April and talked to her. Best contact number [phone]

## 2023-05-29 NOTE — Telephone Encounter (Signed)
Lmom to return call

## 2023-05-29 NOTE — Telephone Encounter (Signed)
Patient returned call and said her question was answered

## 2023-06-03 ENCOUNTER — Other Ambulatory Visit (HOSPITAL_BASED_OUTPATIENT_CLINIC_OR_DEPARTMENT_OTHER): Payer: Self-pay

## 2023-06-11 ENCOUNTER — Telehealth (HOSPITAL_BASED_OUTPATIENT_CLINIC_OR_DEPARTMENT_OTHER): Payer: Self-pay | Admitting: Orthopaedic Surgery

## 2023-06-11 NOTE — Telephone Encounter (Signed)
IC, spoke with patient. Advised her to contact [employer] and then they will fax [place] the form that needs to be filled out. She expressed understanding.

## 2023-06-11 NOTE — Telephone Encounter (Signed)
Cindy Carson would like you to fax over the details about her surgery Date, time Dr Name etc [employer] absent  for [facility] [phone](U) [phone]  Claim number [id number]. Patient states that she gave them the fax number here but they ask if it could be fax.

## 2023-06-12 ENCOUNTER — Telehealth: Payer: Self-pay | Admitting: Nurse Practitioner

## 2023-06-12 ENCOUNTER — Other Ambulatory Visit (HOSPITAL_BASED_OUTPATIENT_CLINIC_OR_DEPARTMENT_OTHER): Payer: Self-pay

## 2023-06-12 NOTE — Telephone Encounter (Signed)
Medication Refill - Medication: ZEMPIC, 0.25 OR 0.5 MG/DOSE, 2 MG/1.5ML SOPN   Has the patient contacted their pharmacy? No. Pt wants to remain on the .25 she has been taking Preferred Pharmacy (with phone number or street name): [facility]  Has the patient been seen for an appointment in the last year OR does the patient have an upcoming appointment? Yes.    Agent: Please be advised that RX refills may take up to 3 business days. We ask that you follow-up with your pharmacy.

## 2023-06-12 NOTE — Telephone Encounter (Signed)
OZEMPIC, 0.25 OR 0.5 MG/DOSE, 2 MG/1.5ML SOPN not on current list.

## 2023-06-15 NOTE — Telephone Encounter (Signed)
Called and scheduled patient on 06/22/2023 @ 9:40 am.

## 2023-06-22 ENCOUNTER — Other Ambulatory Visit: Payer: Self-pay

## 2023-06-22 ENCOUNTER — Other Ambulatory Visit (HOSPITAL_BASED_OUTPATIENT_CLINIC_OR_DEPARTMENT_OTHER): Payer: Self-pay

## 2023-06-22 ENCOUNTER — Ambulatory Visit (INDEPENDENT_AMBULATORY_CARE_PROVIDER_SITE_OTHER): Payer: 59 | Admitting: Family Medicine

## 2023-06-22 VITALS — BP 131/88 | HR 85 | Temp 97.9°F | Ht 62.99 in | Wt 184.0 lb

## 2023-06-22 DIAGNOSIS — Z7985 Long-term (current) use of injectable non-insulin antidiabetic drugs: Secondary | ICD-10-CM | POA: Diagnosis not present

## 2023-06-22 DIAGNOSIS — E1159 Type 2 diabetes mellitus with other circulatory complications: Secondary | ICD-10-CM

## 2023-06-22 DIAGNOSIS — E119 Type 2 diabetes mellitus without complications: Secondary | ICD-10-CM

## 2023-06-22 DIAGNOSIS — I152 Hypertension secondary to endocrine disorders: Secondary | ICD-10-CM

## 2023-06-22 DIAGNOSIS — F339 Major depressive disorder, recurrent, unspecified: Secondary | ICD-10-CM | POA: Diagnosis not present

## 2023-06-22 DIAGNOSIS — E1165 Type 2 diabetes mellitus with hyperglycemia: Secondary | ICD-10-CM | POA: Diagnosis not present

## 2023-06-22 MED ORDER — BLOOD GLUCOSE MONITOR SYSTEM W/DEVICE KIT
1.0000 | PACK | Freq: Three times a day (TID) | 0 refills | Status: AC
Start: 2023-06-22 — End: ?
  Filled 2023-06-22: qty 1, 1d supply, fill #0

## 2023-06-22 MED ORDER — LANCET DEVICE MISC
99 refills | Status: AC
  Filled 2023-06-22: qty 200, fill #0

## 2023-06-22 MED ORDER — BLOOD GLUCOSE TEST VI STRP
ORAL_STRIP | 99 refills | Status: AC
Start: 2023-06-22 — End: ?
  Filled 2023-06-22: qty 100, 25d supply, fill #0

## 2023-06-22 MED ORDER — FREESTYLE LANCETS MISC
11 refills | Status: AC
Start: 2023-06-22 — End: ?
  Filled 2023-06-22: qty 100, 25d supply, fill #0

## 2023-06-22 MED ORDER — OZEMPIC (0.25 OR 0.5 MG/DOSE) 2 MG/3ML ~~LOC~~ SOPN
0.5000 mg | PEN_INJECTOR | SUBCUTANEOUS | 1 refills | Status: DC
  Filled 2023-06-22: qty 3, 28d supply, fill #0

## 2023-06-22 NOTE — Patient Instructions (Addendum)
Take Metamucil daily for constipation. Try compression socks daily and elevating legs for ankle swelling.   The best way to help and manage your diabetes is through diet and exercise.  I recommend: At least 150 minutes of exercise a week- this can be walking, running, swimming, dancing, etc. Getting up and moving significantly helps to burn more sugar in your body, which results in less sugar in the blood.  Increased water intake of at least 64 ounces a day. Avoid soda, juice, and artificially sweetened drinks as these can trigger higher blood sugar levels.  Start with a nine-inch plate and fill half with non-starchy veggies, one-quarter with lean proteins, and one-quarter with and quality carbs like starchy vegetables, fruits, whole grains, or low-fat dairy.  You can find excellent resources at the American Diabetes Association online to help determine how many carbohydrates, fiber, protein, etc are in foods. Also check out http://www.wall-moore.info/ for information on how to read labels and recommended serving sizes.    High Blood Pressure I do recommend checking your blood pressure at least 3 times a week.  Your BP goal is less than 130/80. If your blood pressure is higher than your goal for three readings in a row, please let me know so we can adjust your medications.  If your blood pressure is higher than 190/90 - recheck in 30 minutes when you are relaxed and calm. If it remains this elevated, please seek emergency care.  If you begin to develop symptoms of shortness of breath, chest pain, headache that is constant, daily, or won't go away, vision changes, palpitations, dizziness, or lightheadedness please contact me immediately or seek emergency care. Monitor your salt intake and be sure you stay very well hydrated (at lest 64 ounces of WATER a day). It is recommended to participate in the DASH diet to maintain your blood pressure control.  If you develop a cold/congestion it is recommended that you use  over the counter Coricidin HBP to help with your symptoms as other over the counter cough medicines can increase your blood pressure.

## 2023-06-22 NOTE — Progress Notes (Addendum)
BP 131/88   Pulse 85   Temp 97.9 F (36.6 C) (Oral)   Ht 5' 2.99" (1.6 m)   Wt 184 lb (83.5 kg)   LMP 03/09/2019   SpO2 97%   BMI 32.60 kg/m    Subjective:    Patient ID: Cindy Carson, female    DOB: [birth year], 52 y.o.   MRN: [id number]  HPI: Cindy Carson is a 52 y.o. female  Chief Complaint  Patient presents with   Diabetes    Follow up    Hypertension   Hyperlipidemia   Depression  Patient has upcoming left shoulder surgery scheduled for July 07, 2023.  DIABETES Last A1C 8.0% 3 months ago. Patient currently taking Ozempic 0.5 MG weekly, she experiences constipation as a side effect. She has stopped taking Ozempic 2-3 weeks ago due to running out of refills. Ozempic has provided weight loss benefit for her, she is down 9 lbs since last visit 3 months ago. Previously tried Museum/gallery curator, [place], and Metformin, all were ineffective. She is requesting blood sugar supplies today.   Hypoglycemic episodes:no Polydipsia/polyuria: yes with dry mouth Visual disturbance: yes blurred sometimes, saw ophthalmology 6 months ago Chest pain: no Paresthesias: no Glucose Monitoring: no  Accucheck frequency: Not Checking Taking Insulin?: no Blood Pressure Monitoring: not checking Retinal Examination: Up to Date Foot Exam: Up to Date Diabetic Education: Completed Pneumovax: Not up to Date Influenza: Not up to Date Aspirin: no   HYPERTENSION / HYPERLIPIDEMIA Taking Amlodipine 5 MG and Atorvastatin 20 MG Satisfied with current treatment? yes Duration of hypertension: chronic BP monitoring frequency: not checking BP range: Not checking, agrees to start BP medication side effects: no Duration of hyperlipidemia: chronic Cholesterol medication side effects: no Cholesterol supplements: None Medication compliance: excellent compliance Aspirin: no Recent stressors: yes Recurrent headaches: no Visual changes: yes starts to have blurred vision r/t looking at computer for extended period  of time Palpitations: no Dyspnea: no Chest pain: no Lower extremity edema: yes ankle swelling from sitting a lot  Dizzy/lightheaded: yes associated with missed doses of Cymbalta  DEPRESSION PHQ-9: 9 GAD 7: 6 Currently having online therapy with psychiatrist once weekly with [facility]. She admits the Cymbalta 30 MG combined with therapy is going well for her.  Mood status: stable Satisfied with current treatment?: yes Symptom severity: mild  Duration of current treatment : chronic Side effects: no Medication compliance: excellent compliance Psychotherapy/counseling: yes current Depressed mood: no Anxious mood: no Anhedonia: yes Significant weight loss or gain: no Insomnia: yes hard to stay asleep due to increased thirst Fatigue: yes Feelings of worthlessness or guilt: yes Impaired concentration/indecisiveness: yes Suicidal ideations: no Hopelessness: yes Crying spells: yes    06/22/2023   10:06 AM 03/05/2023   10:40 AM 12/25/2022   10:08 AM 11/24/2022   10:14 AM 10/27/2022    9:37 AM  Depression screen PHQ 2/9  Decreased Interest 1 1 2 1 2   Down, Depressed, Hopeless 1 1 1 1 1   PHQ - 2 Score 2 2 3 2 3   Altered sleeping 1 1 1 1 1   Tired, decreased energy 1 1 2 2 2   Change in appetite 1 1 2 1 2   Feeling bad or failure about yourself  1 0 1 2 1   Trouble concentrating 1 0 2 0 0  Moving slowly or fidgety/restless 1 0 2 1 0  Suicidal thoughts 1 0 0 0 0  PHQ-9 Score 9 5 13 9 9   Difficult doing work/chores Somewhat difficult Not difficult  at all Somewhat difficult Somewhat difficult Not difficult at all       06/22/2023   10:07 AM 03/05/2023   10:41 AM 12/25/2022   10:09 AM 11/24/2022   10:14 AM  GAD 7 : Generalized Anxiety Score  Nervous, Anxious, on Edge 1 1 1  0  Control/stop worrying 1 1 1 1   Worry too much - different things  1 1 1   Trouble relaxing 1 1 1 1   Restless 1 0 1 0  Easily annoyed or irritable 1 0 1 1  Afraid - awful might happen 1 0 1 0  Total GAD 7 Score  4 7  4   Anxiety Difficulty  Not difficult at all Somewhat difficult Somewhat difficult      Relevant past medical, surgical, family and social history reviewed and updated as indicated. Interim medical history since our last visit reviewed. Allergies and medications reviewed and updated.  Review of Systems  Constitutional:  Positive for fatigue. Negative for fever.  Eyes:  Positive for visual disturbance.  Respiratory: Negative.  Negative for shortness of breath.   Cardiovascular:  Positive for leg swelling. Negative for chest pain and palpitations.  Gastrointestinal:  Positive for constipation.  Endocrine: Positive for polydipsia. Negative for polyphagia and polyuria.  Genitourinary:  Negative for frequency.  Neurological:  Positive for light-headedness. Negative for dizziness, numbness and headaches.  Psychiatric/Behavioral:  Positive for decreased concentration and sleep disturbance. Negative for dysphoric mood. The patient is not nervous/anxious.     Per HPI unless specifically indicated above     Objective:    BP 131/88   Pulse 85   Temp 97.9 F (36.6 C) (Oral)   Ht 5' 2.99" (1.6 m)   Wt 184 lb (83.5 kg)   LMP 03/09/2019   SpO2 97%   BMI 32.60 kg/m   Wt Readings from Last 3 Encounters:  06/22/23 184 lb (83.5 kg)  03/05/23 193 lb 3.2 oz (87.6 kg)  12/25/22 184 lb 4.8 oz (83.6 kg)    Physical Exam Vitals and nursing note reviewed.  Constitutional:      General: She is awake. She is not in acute distress.    Appearance: Normal appearance. She is well-developed and well-groomed. She is obese. She is not ill-appearing.  HENT:     Head: Normocephalic and atraumatic.     Right Ear: Hearing and external ear normal. No drainage.     Left Ear: Hearing and external ear normal. No drainage.     Nose: Nose normal.  Eyes:     General: Lids are normal.        Right eye: No discharge.        Left eye: No discharge.     Conjunctiva/sclera: Conjunctivae normal.  Cardiovascular:      Rate and Rhythm: Normal rate and regular rhythm.     Pulses:          Radial pulses are 2+ on the right side and 2+ on the left side.       Posterior tibial pulses are 2+ on the right side and 2+ on the left side.     Heart sounds: Normal heart sounds, S1 normal and S2 normal. No murmur heard.    No gallop.  Pulmonary:     Effort: Pulmonary effort is normal. No accessory muscle usage or respiratory distress.     Breath sounds: Normal breath sounds.  Musculoskeletal:        General: Normal range of motion.     Cervical  back: Full passive range of motion without pain and normal range of motion.     Right lower leg: No edema.     Left lower leg: No edema.  Skin:    General: Skin is warm and dry.     Capillary Refill: Capillary refill takes less than 2 seconds.  Neurological:     Mental Status: She is alert and oriented to person, place, and time.  Psychiatric:        Attention and Perception: Attention normal.        Mood and Affect: Mood normal.        Speech: Speech normal.        Behavior: Behavior normal. Behavior is cooperative.        Thought Content: Thought content normal.     Results for orders placed or performed in visit on 06/22/23  HgB A1c  Result Value Ref Range   Hgb A1c MFr Bld 12.9 (H) 4.8 - 5.6 %   Est. average glucose Bld gHb Est-mCnc 324 mg/dL  Comp Met (CMET)  Result Value Ref Range   Glucose 349 (H) 70 - 99 mg/dL   BUN 12 6 - 24 mg/dL   Creatinine, Ser 4.09 0.57 - 1.00 mg/dL   eGFR 76 >81 XB/JYN/8.29   BUN/Creatinine Ratio 13 9 - 23   Sodium 138 134 - 144 mmol/L   Potassium 4.7 3.5 - 5.2 mmol/L   Chloride 100 96 - 106 mmol/L   CO2 26 20 - 29 mmol/L   Calcium 9.6 8.7 - 10.2 mg/dL   Total Protein 7.1 6.0 - 8.5 g/dL   Albumin 4.3 3.8 - 4.9 g/dL   Globulin, Total 2.8 1.5 - 4.5 g/dL   Bilirubin Total 0.4 0.0 - 1.2 mg/dL   Alkaline Phosphatase 125 (H) 44 - 121 IU/L   AST 19 0 - 40 IU/L   ALT 27 0 - 32 IU/L      Assessment & Plan:   Problem  List Items Addressed This Visit     Hypertension complicating diabetes (HCC)    Chronic. Stable. Continue with current medication regimen of Amlodipine 5 MG and Atorvastatin 20 MG daily. CMP done today. Recommend DASH diet, 150 mins of physical activity weekly, and to check BP at home and bring BP log at next visit.Marland Kitchen Return in 3 months.        Relevant Orders   Comp Met (CMET) (Completed)   Type 2 diabetes mellitus with hyperglycemia, without long-term current use of insulin (HCC) - Primary    Chronic,Stable. Continue current medication regimen of Ozempic 0.5 MG weekly. A1C done today. Previous A1C is  8.0 %. Recommend checking and recording blood sugars at home various times of the day throughout the week to monitor for hypoglycemia or hyperglycemia, prescription sent for supplies. Refills given. Follow up in 1 month.      Relevant Orders   HgB A1c (Completed)   Comp Met (CMET) (Completed)   Depression, recurrent (HCC)    Chronic, stable. Recommend continued Duloxetine (Cymbalta) 30 MG daily along with x1 weekly online therapy sessions. Recommend relaxation techniques and committing to activities that help provide stress relief. Return in 3 months for follow up.          Follow up plan: Return in about 1 month (around 07/22/2023), or DM2, for Follow up.

## 2023-06-22 NOTE — Assessment & Plan Note (Addendum)
Chronic, stable. Recommend continued Duloxetine (Cymbalta) 30 MG daily along with x1 weekly online therapy sessions. Recommend relaxation techniques and committing to activities that help provide stress relief. Return in 3 months for follow up.

## 2023-06-22 NOTE — Assessment & Plan Note (Addendum)
Chronic,Stable. Continue current medication regimen of Ozempic 0.5 MG weekly. A1C done today. Previous A1C is  8.0 %. Recommend checking and recording blood sugars at home various times of the day throughout the week to monitor for hypoglycemia or hyperglycemia, prescription sent for supplies. Refills given. Follow up in 1 month.

## 2023-06-22 NOTE — Assessment & Plan Note (Addendum)
Chronic. Stable. Continue with current medication regimen of Amlodipine 5 MG and Atorvastatin 20 MG daily. CMP done today. Recommend DASH diet, 150 mins of physical activity weekly, and to check BP at home and bring BP log at next visit.Marland Kitchen Return in 3 months.

## 2023-06-23 LAB — COMPREHENSIVE METABOLIC PANEL
ALT: 27 [IU]/L (ref 0–32)
AST: 19 [IU]/L (ref 0–40)
Albumin: 4.3 g/dL (ref 3.8–4.9)
Alkaline Phosphatase: 125 [IU]/L — ABNORMAL HIGH (ref 44–121)
BUN/Creatinine Ratio: 13 (ref 9–23)
BUN: 12 mg/dL (ref 6–24)
Bilirubin Total: 0.4 mg/dL (ref 0.0–1.2)
CO2: 26 mmol/L (ref 20–29)
Calcium: 9.6 mg/dL (ref 8.7–10.2)
Chloride: 100 mmol/L (ref 96–106)
Creatinine, Ser: 0.9 mg/dL (ref 0.57–1.00)
Globulin, Total: 2.8 g/dL (ref 1.5–4.5)
Glucose: 349 mg/dL — ABNORMAL HIGH (ref 70–99)
Potassium: 4.7 mmol/L (ref 3.5–5.2)
Sodium: 138 mmol/L (ref 134–144)
Total Protein: 7.1 g/dL (ref 6.0–8.5)
eGFR: 76 mL/min/{1.73_m2} (ref >59)

## 2023-06-23 LAB — HEMOGLOBIN A1C
Est. average glucose Bld gHb Est-mCnc: 324 mg/dL
Hgb A1c MFr Bld: 12.9 % — ABNORMAL HIGH (ref 4.8–5.6)

## 2023-06-23 MED ORDER — SEMAGLUTIDE (1 MG/DOSE) 4 MG/3ML ~~LOC~~ SOPN
1.0000 mg | PEN_INJECTOR | SUBCUTANEOUS | 0 refills | Status: DC
  Filled 2023-06-23: qty 3, 28d supply, fill #0

## 2023-06-23 MED ORDER — LANTUS SOLOSTAR 100 UNIT/ML ~~LOC~~ SOPN
10.0000 [IU] | PEN_INJECTOR | Freq: Every day | SUBCUTANEOUS | 99 refills | Status: DC
Start: 2023-06-23 — End: 2023-06-25
  Filled 2023-06-23: qty 15, 60d supply, fill #0

## 2023-06-23 NOTE — Addendum Note (Signed)
Addended by: Prescott Gum on: 06/23/2023 05:52 PM   Modules accepted: Orders

## 2023-06-24 ENCOUNTER — Other Ambulatory Visit (HOSPITAL_BASED_OUTPATIENT_CLINIC_OR_DEPARTMENT_OTHER): Payer: Self-pay

## 2023-06-25 ENCOUNTER — Other Ambulatory Visit: Payer: Self-pay | Admitting: Family Medicine

## 2023-06-25 ENCOUNTER — Other Ambulatory Visit (HOSPITAL_BASED_OUTPATIENT_CLINIC_OR_DEPARTMENT_OTHER): Payer: Self-pay

## 2023-06-25 MED ORDER — INSULIN GLARGINE-YFGN 100 UNIT/ML ~~LOC~~ SOPN
PEN_INJECTOR | SUBCUTANEOUS | 2 refills | Status: DC
  Filled 2023-06-25: qty 6, 24d supply, fill #0
  Filled 2023-06-25: qty 15, 60d supply, fill #0
  Filled 2023-07-28: qty 6, 24d supply, fill #1
  Filled 2023-10-31: qty 6, 24d supply, fill #2

## 2023-06-25 MED ORDER — UNIFINE PENTIPS 31G X 6 MM MISC
1 refills | Status: AC
  Filled 2023-06-25: qty 100, 90d supply, fill #0

## 2023-06-25 NOTE — Progress Notes (Signed)
Hi Cindy Carson, as discussed on phone encounter, A1C and blood glucose levels remain elevated. Continue with plan as discussed for starting long acting insulin nightly and follow up as discussed. Your electrolytes, kidney function, and liver function have returned as normal. Thank you for allowing me to participate in your care.

## 2023-06-29 ENCOUNTER — Encounter (HOSPITAL_BASED_OUTPATIENT_CLINIC_OR_DEPARTMENT_OTHER): Payer: Self-pay | Admitting: Orthopaedic Surgery

## 2023-06-29 ENCOUNTER — Other Ambulatory Visit: Payer: Self-pay

## 2023-07-01 ENCOUNTER — Encounter (HOSPITAL_BASED_OUTPATIENT_CLINIC_OR_DEPARTMENT_OTHER)
Admission: RE | Admit: 2023-07-01 | Discharge: 2023-07-01 | Disposition: A | Discharge status: Home / Self Care | Payer: 59 | Source: Ambulatory Visit | Attending: Orthopaedic Surgery | Admitting: Orthopaedic Surgery

## 2023-07-01 DIAGNOSIS — Z01810 Encounter for preprocedural cardiovascular examination: Secondary | ICD-10-CM | POA: Insufficient documentation

## 2023-07-01 NOTE — Progress Notes (Signed)

## 2023-07-06 NOTE — Anesthesia Preprocedure Evaluation (Signed)
Anesthesia Evaluation  Patient identified by MRN, date of birth, ID band Patient awake    Reviewed: Allergy & Precautions, NPO status , Patient's Chart, lab work & pertinent test results  Airway Mallampati: II  TM Distance: >3 FB Neck ROM: Full    Dental no notable dental hx. (+) Teeth Intact, Dental Advisory Given   Pulmonary sleep apnea    Pulmonary exam normal breath sounds clear to auscultation       Cardiovascular hypertension, Pt. on medications Normal cardiovascular exam Rhythm:Regular Rate:Normal     Neuro/Psych    GI/Hepatic   Endo/Other  diabetes, Type 2    Renal/GU      Musculoskeletal   Abdominal   Peds  Hematology   Anesthesia Other Findings All: NSAIDS  Reproductive/Obstetrics                             Anesthesia Physical Anesthesia Plan  ASA: 3  Anesthesia Plan: Regional and General   Post-op Pain Management: Regional block*, Minimal or no pain anticipated and Toradol IV (intra-op)*   Induction: Intravenous  PONV Risk Score and Plan: 3 and Treatment may vary due to age or medical condition, Midazolam and Ondansetron  Airway Management Planned: Oral ETT  Additional Equipment: None  Intra-op Plan:   Post-operative Plan:   Informed Consent: I have reviewed the patients History and Physical, chart, labs and discussed the procedure including the risks, benefits and alternatives for the proposed anesthesia with the patient or authorized representative who has indicated his/her understanding and acceptance.     Dental advisory given  Plan Discussed with: CRNA  Anesthesia Plan Comments: (GA w L ISB)        Anesthesia Quick Evaluation

## 2023-07-07 ENCOUNTER — Ambulatory Visit (HOSPITAL_BASED_OUTPATIENT_CLINIC_OR_DEPARTMENT_OTHER): Payer: Self-pay | Admitting: Anesthesiology

## 2023-07-07 ENCOUNTER — Encounter (HOSPITAL_BASED_OUTPATIENT_CLINIC_OR_DEPARTMENT_OTHER)
Admission: RE | Disposition: A | Discharge status: Home / Self Care | Payer: Self-pay | Source: Home / Self Care | Attending: Orthopaedic Surgery

## 2023-07-07 ENCOUNTER — Ambulatory Visit (HOSPITAL_BASED_OUTPATIENT_CLINIC_OR_DEPARTMENT_OTHER): Payer: 59 | Admitting: Anesthesiology

## 2023-07-07 ENCOUNTER — Encounter (HOSPITAL_BASED_OUTPATIENT_CLINIC_OR_DEPARTMENT_OTHER): Payer: Self-pay | Admitting: Orthopaedic Surgery

## 2023-07-07 ENCOUNTER — Ambulatory Visit (HOSPITAL_BASED_OUTPATIENT_CLINIC_OR_DEPARTMENT_OTHER)
Admission: RE | Admit: 2023-07-07 | Discharge: 2023-07-07 | Disposition: A | Discharge status: Home / Self Care | Payer: 59 | Attending: Orthopaedic Surgery | Admitting: Orthopaedic Surgery

## 2023-07-07 ENCOUNTER — Ambulatory Visit: Payer: Self-pay

## 2023-07-07 DIAGNOSIS — M75112 Incomplete rotator cuff tear or rupture of left shoulder, not specified as traumatic: Secondary | ICD-10-CM | POA: Insufficient documentation

## 2023-07-07 DIAGNOSIS — G4730 Sleep apnea, unspecified: Secondary | ICD-10-CM | POA: Diagnosis not present

## 2023-07-07 DIAGNOSIS — M25812 Other specified joint disorders, left shoulder: Secondary | ICD-10-CM | POA: Insufficient documentation

## 2023-07-07 DIAGNOSIS — Z01818 Encounter for other preprocedural examination: Secondary | ICD-10-CM

## 2023-07-07 DIAGNOSIS — M75102 Unspecified rotator cuff tear or rupture of left shoulder, not specified as traumatic: Secondary | ICD-10-CM | POA: Diagnosis not present

## 2023-07-07 DIAGNOSIS — M75122 Complete rotator cuff tear or rupture of left shoulder, not specified as traumatic: Secondary | ICD-10-CM | POA: Diagnosis not present

## 2023-07-07 DIAGNOSIS — I10 Essential (primary) hypertension: Secondary | ICD-10-CM

## 2023-07-07 DIAGNOSIS — E119 Type 2 diabetes mellitus without complications: Secondary | ICD-10-CM | POA: Insufficient documentation

## 2023-07-07 DIAGNOSIS — G8918 Other acute postprocedural pain: Secondary | ICD-10-CM | POA: Diagnosis not present

## 2023-07-07 HISTORY — PX: SHOULDER ARTHROSCOPY WITH ROTATOR CUFF REPAIR: SHX5685

## 2023-07-07 LAB — GLUCOSE, CAPILLARY
Glucose-Capillary: 332 mg/dL — ABNORMAL HIGH (ref 70–99)
Glucose-Capillary: 318 mg/dL — ABNORMAL HIGH (ref 70–99)
Glucose-Capillary: 237 mg/dL — ABNORMAL HIGH (ref 70–99)
Glucose-Capillary: 279 mg/dL — ABNORMAL HIGH (ref 70–99)
Glucose-Capillary: 230 mg/dL — ABNORMAL HIGH (ref 70–99)
Glucose-Capillary: 208 mg/dL — ABNORMAL HIGH (ref 70–99)

## 2023-07-07 SURGERY — ARTHROSCOPY, SHOULDER, WITH ROTATOR CUFF REPAIR
Anesthesia: Regional | Laterality: Left

## 2023-07-07 MED ORDER — PHENYLEPHRINE HCL (PRESSORS) 10 MG/ML IV SOLN
INTRAVENOUS | Status: AC
  Filled 2023-07-07: qty 1

## 2023-07-07 MED ORDER — SODIUM CHLORIDE 0.9 % IR SOLN
Status: DC | PRN
  Administered 2023-07-07: 15000 mL

## 2023-07-07 MED ORDER — TRANEXAMIC ACID-NACL 1000-0.7 MG/100ML-% IV SOLN
1000.0000 mg | INTRAVENOUS | Status: AC
  Administered 2023-07-07: 1000 mg via INTRAVENOUS

## 2023-07-07 MED ORDER — ACETAMINOPHEN 500 MG PO TABS
ORAL_TABLET | ORAL | Status: AC
  Filled 2023-07-07: qty 2

## 2023-07-07 MED ORDER — LACTATED RINGERS IV SOLN: INTRAVENOUS | Status: DC

## 2023-07-07 MED ORDER — HYDROMORPHONE HCL 1 MG/ML IJ SOLN: 0.2500 mg | INTRAMUSCULAR | Status: DC | PRN

## 2023-07-07 MED ORDER — PHENYLEPHRINE 80 MCG/ML (10ML) SYRINGE FOR IV PUSH (FOR BLOOD PRESSURE SUPPORT)
PREFILLED_SYRINGE | INTRAVENOUS | Status: AC
  Filled 2023-07-07: qty 20

## 2023-07-07 MED ORDER — CEFAZOLIN SODIUM-DEXTROSE 2-4 GM/100ML-% IV SOLN
2.0000 g | INTRAVENOUS | Status: AC
  Administered 2023-07-07: 2 g via INTRAVENOUS

## 2023-07-07 MED ORDER — MIDAZOLAM HCL 2 MG/2ML IJ SOLN
INTRAMUSCULAR | Status: AC
  Filled 2023-07-07: qty 2

## 2023-07-07 MED ORDER — INSULIN ASPART 100 UNIT/ML IJ SOLN
4.0000 [IU] | Freq: Once | INTRAMUSCULAR | Status: AC
  Administered 2023-07-07: 4 [IU] via SUBCUTANEOUS

## 2023-07-07 MED ORDER — OXYCODONE HCL 5 MG PO TABS: 5.0000 mg | ORAL_TABLET | Freq: Once | ORAL | Status: DC | PRN

## 2023-07-07 MED ORDER — ONDANSETRON HCL 4 MG/2ML IJ SOLN: 4.0000 mg | Freq: Once | INTRAMUSCULAR | Status: DC | PRN

## 2023-07-07 MED ORDER — ONDANSETRON HCL 4 MG/2ML IJ SOLN
INTRAMUSCULAR | Status: DC | PRN
  Administered 2023-07-07: 4 mg via INTRAVENOUS

## 2023-07-07 MED ORDER — PROPOFOL 10 MG/ML IV BOLUS
INTRAVENOUS | Status: DC | PRN
  Administered 2023-07-07: 150 mg via INTRAVENOUS

## 2023-07-07 MED ORDER — PHENYLEPHRINE HCL-NACL 20-0.9 MG/250ML-% IV SOLN
INTRAVENOUS | Status: DC | PRN
Start: 2023-07-07 — End: 2023-07-07
  Administered 2023-07-07: 50 ug/min via INTRAVENOUS

## 2023-07-07 MED ORDER — ACETAMINOPHEN 500 MG PO TABS
1000.0000 mg | ORAL_TABLET | Freq: Once | ORAL | Status: AC
  Administered 2023-07-07: 1000 mg via ORAL

## 2023-07-07 MED ORDER — MIDAZOLAM HCL 2 MG/2ML IJ SOLN
2.0000 mg | Freq: Once | INTRAMUSCULAR | Status: AC
  Administered 2023-07-07: 2 mg via INTRAVENOUS

## 2023-07-07 MED ORDER — GABAPENTIN 300 MG PO CAPS
ORAL_CAPSULE | ORAL | Status: AC
  Filled 2023-07-07: qty 1

## 2023-07-07 MED ORDER — DROPERIDOL 2.5 MG/ML IJ SOLN: 0.6250 mg | Freq: Once | INTRAMUSCULAR | Status: DC | PRN

## 2023-07-07 MED ORDER — SODIUM CHLORIDE 0.9 % IV SOLN
INTRAVENOUS | Status: DC | PRN
Start: 2023-07-07 — End: 2023-07-07

## 2023-07-07 MED ORDER — FENTANYL CITRATE (PF) 100 MCG/2ML IJ SOLN
INTRAMUSCULAR | Status: AC
  Filled 2023-07-07: qty 2

## 2023-07-07 MED ORDER — ROCURONIUM BROMIDE 100 MG/10ML IV SOLN
INTRAVENOUS | Status: DC | PRN
  Administered 2023-07-07: 50 mg via INTRAVENOUS
  Administered 2023-07-07: 5 mg via INTRAVENOUS

## 2023-07-07 MED ORDER — GABAPENTIN 300 MG PO CAPS
300.0000 mg | ORAL_CAPSULE | Freq: Once | ORAL | Status: AC
  Administered 2023-07-07: 300 mg via ORAL

## 2023-07-07 MED ORDER — FENTANYL CITRATE (PF) 100 MCG/2ML IJ SOLN: 100.0000 ug | Freq: Once | INTRAMUSCULAR | Status: DC

## 2023-07-07 MED ORDER — ACETAMINOPHEN 10 MG/ML IV SOLN: 1000.0000 mg | Freq: Once | INTRAVENOUS | Status: DC | PRN

## 2023-07-07 MED ORDER — SUGAMMADEX SODIUM 200 MG/2ML IV SOLN
INTRAVENOUS | Status: DC | PRN
  Administered 2023-07-07: 200 mg via INTRAVENOUS

## 2023-07-07 MED ORDER — CEFAZOLIN SODIUM-DEXTROSE 2-4 GM/100ML-% IV SOLN
INTRAVENOUS | Status: AC
  Filled 2023-07-07: qty 100

## 2023-07-07 MED ORDER — TRANEXAMIC ACID-NACL 1000-0.7 MG/100ML-% IV SOLN
INTRAVENOUS | Status: AC
  Filled 2023-07-07: qty 100

## 2023-07-07 MED ORDER — EPHEDRINE SULFATE (PRESSORS) 50 MG/ML IJ SOLN
INTRAMUSCULAR | Status: DC | PRN
  Administered 2023-07-07 (×2): 5 mg via INTRAVENOUS
  Administered 2023-07-07: 10 mg via INTRAVENOUS
  Administered 2023-07-07 (×2): 5 mg via INTRAVENOUS
  Administered 2023-07-07: 10 mg via INTRAVENOUS

## 2023-07-07 MED ORDER — LIDOCAINE HCL (CARDIAC) PF 100 MG/5ML IV SOSY
PREFILLED_SYRINGE | INTRAVENOUS | Status: DC | PRN
  Administered 2023-07-07: 100 mg via INTRAVENOUS

## 2023-07-07 MED ORDER — OXYCODONE HCL 5 MG/5ML PO SOLN: 5.0000 mg | Freq: Once | ORAL | Status: DC | PRN

## 2023-07-07 MED ORDER — INSULIN ASPART 100 UNIT/ML IJ SOLN
7.0000 [IU] | Freq: Once | INTRAMUSCULAR | Status: AC
  Administered 2023-07-07: 7 [IU] via SUBCUTANEOUS

## 2023-07-07 MED ORDER — PHENYLEPHRINE HCL (PRESSORS) 10 MG/ML IV SOLN
INTRAVENOUS | Status: DC | PRN
  Administered 2023-07-07 (×8): 80 ug via INTRAVENOUS

## 2023-07-07 SURGICAL SUPPLY — 65 items
AID PSTN UNV HD RSTRNT DISP (MISCELLANEOUS) ×1
ANCH SUT 2 FBRTK KNTLS 1.8 (Anchor) IMPLANT
ANCH SUT KNTLS STRL SHLDR SYS (Anchor) ×1 IMPLANT
ANCHOR DUAL INSERTER (Anchor) IMPLANT
ANCHOR JUGGERKNOT 1.5 SOFT S-P (Anchor) IMPLANT
ANCHOR JUGGERKNOT SOFT S-P (Anchor) ×1 IMPLANT
ANCHOR SUT QUATTRO KNTLS 4.5 (Anchor) IMPLANT
APL PRP STRL LF DISP 70% ISPRP (MISCELLANEOUS) ×2
BLADE EXCALIBUR 4.0X13 (MISCELLANEOUS) ×1 IMPLANT
BURR OVAL 8 FLU 4.0X13 (MISCELLANEOUS) ×1 IMPLANT
CANNULA 5.75X71 LONG (CANNULA) IMPLANT
CANNULA 7X7 TWIST-IN (CANNULA) IMPLANT
CANNULA PASSPORT 5 (CANNULA) IMPLANT
CANNULA PASSPORT BUTTON 10-40 (CANNULA) IMPLANT
CANNULA TAPESTRY RC (CANNULA) IMPLANT
CANNULA TWIST IN 8.25X7CM (CANNULA) IMPLANT
CHLORAPREP W/TINT 26 (MISCELLANEOUS) ×2 IMPLANT
COOLER ICEMAN CLASSIC (MISCELLANEOUS) ×1 IMPLANT
DRAPE IMP U-DRAPE 54X76 (DRAPES) ×1 IMPLANT
DRAPE INCISE IOBAN 66X45 STRL (DRAPES) ×1 IMPLANT
DRAPE SHOULDER BEACH CHAIR (DRAPES) ×1 IMPLANT
DRAPE U-SHAPE 47X51 STRL (DRAPES) ×2 IMPLANT
DW OUTFLOW CASSETTE/TUBE SET (MISCELLANEOUS) ×1 IMPLANT
GAUZE PAD ABD 8X10 STRL (GAUZE/BANDAGES/DRESSINGS) ×1 IMPLANT
GAUZE SPONGE 4X4 12PLY STRL (GAUZE/BANDAGES/DRESSINGS) ×1 IMPLANT
GAUZE XEROFORM 1X8 LF (GAUZE/BANDAGES/DRESSINGS) ×1 IMPLANT
GLOVE BIO SURGEON STRL SZ 6 (GLOVE) ×2 IMPLANT
GLOVE BIO SURGEON STRL SZ7.5 (GLOVE) ×1 IMPLANT
GLOVE BIOGEL PI IND STRL 6.5 (GLOVE) ×1 IMPLANT
GLOVE BIOGEL PI IND STRL 8 (GLOVE) ×1 IMPLANT
GOWN STRL REUS W/ TWL LRG LVL3 (GOWN DISPOSABLE) ×2 IMPLANT
GOWN STRL REUS W/ TWL XL LVL3 (GOWN DISPOSABLE) ×1 IMPLANT
GOWN STRL REUS W/TWL LRG LVL3 (GOWN DISPOSABLE) ×2
GOWN STRL REUS W/TWL XL LVL3 (GOWN DISPOSABLE) IMPLANT
KIT SHOULDER STAB MARCO (KITS) ×1 IMPLANT
KIT STR SPEAR 1.8 FBRTK DISP (KITS) IMPLANT
LASSO 90 CVE QUICKPAS (DISPOSABLE) IMPLANT
LASSO CRESCENT QUICKPASS (SUTURE) IMPLANT
MANIFOLD NEPTUNE II (INSTRUMENTS) ×1 IMPLANT
NDL HD SCORPION MEGA LOADER (NEEDLE) IMPLANT
NDL SAFETY ECLIPSE 18X1.5 (NEEDLE) ×1 IMPLANT
PACK ARTHROSCOPY DSU (CUSTOM PROCEDURE TRAY) ×1 IMPLANT
PACK BASIN DAY SURGERY FS (CUSTOM PROCEDURE TRAY) ×1 IMPLANT
PAD COLD SHLDR WRAP-ON (PAD) ×1 IMPLANT
PAD ORTHO SHOULDER 7X19 LRG (SOFTGOODS) IMPLANT
RESTRAINT HEAD UNIVERSAL NS (MISCELLANEOUS) ×1 IMPLANT
SHEET MEDIUM DRAPE 40X70 STRL (DRAPES) ×1 IMPLANT
SLEEVE SCD COMPRESS KNEE MED (STOCKING) ×1 IMPLANT
SUT ETHILON 3 0 PS 1 (SUTURE) ×1 IMPLANT
SUT FIBERWIRE #2 38 T-5 BLUE (SUTURE)
SUT PDS AB 1 CT 36 (SUTURE) IMPLANT
SUT TIGER TAPE 7 IN WHITE (SUTURE) IMPLANT
SUTURE FIBERWR #2 38 T-5 BLUE (SUTURE) IMPLANT
SUTURE TAPE 1.3 40 TPR END (SUTURE) IMPLANT
SUTURE TAPE TIGERLINK 1.3MM BL (SUTURE) IMPLANT
SUTURETAPE 1.3 40 TPR END (SUTURE)
SUTURETAPE TIGERLINK 1.3MM BL (SUTURE)
SYR 5ML LL (SYRINGE) ×1 IMPLANT
TAPE FIBER 2MM 7IN #2 BLUE (SUTURE) IMPLANT
TAPESTRY RC STS 28X28 (Orthopedic Implant) ×1 IMPLANT
TENDON TAPESTRY RC STS 28X28 (Orthopedic Implant) IMPLANT
TOWEL GREEN STERILE FF (TOWEL DISPOSABLE) ×2 IMPLANT
TUBE CONNECTING 20X1/4 (TUBING) ×1 IMPLANT
TUBING ARTHROSCOPY IRRIG 16FT (MISCELLANEOUS) ×1 IMPLANT
WAND ABLATOR APOLLO I90 (BUR) ×1 IMPLANT

## 2023-07-07 NOTE — Op Note (Signed)
Date of Surgery: 07/07/2023  INDICATIONS: Ms. Cindy Carson is a 52 y.o.-year-old female with left shoulder rotator cuff tear.  The risk and benefits of the procedure were discussed in detail and documented in the pre-operative evaluation.   PREOPERATIVE DIAGNOSES: Left shoulder, chronic rotator cuff tear and subacromial impingement.  POSTOPERATIVE DIAGNOSIS: Same.  PROCEDURE: Arthroscopic extensive debridement - 29823 Subdeltoid Bursa, Supraspinatus Tendon, Anterior Labrum, and Superior Labrum Arthroscopic subacromial decompression - 16109 Arthroscopic rotator cuff repair - 60454  SURGEON: Benancio Deeds MD  ASSISTANT: Kerby Less, ATC  ANESTHESIA:  general plus interscalene nerve block  IV FLUIDS AND URINE: See anesthesia record.  ANTIBIOTICS: Ancef  ESTIMATED BLOOD LOSS: 10 mL.  IMPLANTS:  Implant Name Type Inv. Item Serial No. Manufacturer Lot No. LRB No. Used Action  ANCHOR JUGGERKNOT SOFT S-P - [id number] Anchor ANCHOR JUGGERKNOT SOFT S-P  ZIMMER RECON(ORTH,TRAU,BIO,SG) [id number] Left 1 Implanted  [facility] SUT KNTLS STRL SHLDR SYS - [id number] Anchor ANCH SUT KNTLS STRL SHLDR SYS  ZIMMER RECON(ORTH,TRAU,BIO,SG) [id number] Left 1 Implanted  TAPESTRY RC STS [id number] - [id number] Orthopedic Implant TAPESTRY RC STS [id number]  ZIMMER RECON(ORTH,TRAU,BIO,SG) [id number] Left 1 Implanted  ANCHOR DUAL INSERTER - [id number] Anchor ANCHOR DUAL INSERTER  ZIMMER RECON(ORTH,TRAU,BIO,SG) [id number] Left 1 Implanted  ANCHOR DUAL INSERTER - [id number] Anchor ANCHOR DUAL INSERTER  ZIMMER RECON(ORTH,TRAU,BIO,SG) [id number] Left 1 Implanted    DRAINS: None  CULTURES: None  COMPLICATIONS: none  PROCEDURE:    OPERATIVE FINDING: Exam under anesthesia:   Examination under anesthesia revealed forward elevation of 150 degrees.  With the arm at the side, there was 65 degrees of external rotation.  There is a 1+ anterior load shift and a 1+ posterior load shift.    Arthroscopic findings demonstrated: Articular  space: Inflammation synovial hypertrophy of the rotator interval Chondral surfaces: Normal Biceps: There was a self tenotomy with frayed residual stump Subscapularis: Intact Supraspinatus: 80% partial sided tear Infraspinatus: Intact    I identified the patient in the pre-operative holding area.  I marked the operative right shoulder with my initials. I reviewed the risks and benefits of the proposed surgical intervention and the patient wished to proceed.  Anesthesia was then performed with regional block.  The patient was transferred to the operative suite and placed in the beach chair position with all bony prominences padded.     SCDs were placed on bilateral lower extremity. Appropriate antibiotics was administered within 1 hour before incision.  Anesthesia was induced.  The operative extremity was then prepped and draped in standard fashion. A time out was performed confirming the correct extremity, correct patient and correct procedure.   The arthroscope was introduced in the glenohumeral joint from a posterior portal.  An anterior portal was created.  The shoulder was examined and the above findings were noted.     With an arthroscopic shaver and a wand ablator, synovitis throughout the  shoulder was resected.  The arthroscopic shaver was used to excise torn portions of the labrum back to a stable margin. Specifically this was done for the anterior superior and posterior labrum.  Collene Mares was introduced through a lateral subacromial portal which is local utilized and the tear was completed from the articular view in the lateral subacromial portal.  Through visualization from intra-articular, the footprint of the rotator cuff was debrided of soft tissues back to bleeding bone.  This was done with an arthroscopic shaver.     The rotator cuff was then approached through the subacromial space. Anterior, lateral, and posterior portals  were used.  Bursectomy was performed with an arthroscopic  shaver and [employer] wand.  The soft tissues on the undersurface of the acromion and clavicle were resected with the arthroscopic shaver and wand. There was good excursion that was noted of the tendon back to its footprint which would be amendable to an repair.   The rotator cuff was repaired with a double row configuration with1 all suture medial row anchor as noted above with sutures passed from anterior to posterior in horizontal fashion with a self passing suture device.   A total of 4 limbs of suture were passed.  The sutures were placed with a single lateral row anchor.  This provided excellent anatomic footprint approximation.  Given her diabetic status and age the decision was made to perform a collagen patch augmentation.  This implant was introduced into the anterior portal.  The tacks were then placed onto the lateral portion of this and the implant was removed.  A final medial tap was placed with good opposition to the repair.   The shoulder was irrigated.  The arthroscopic instruments were removed.  Wounds were closed with 3-0 nylon sutures.  A sterile dressing was applied with xeroform, 4x8s, abdominal pad, and tape. An Flonnie Hailstone was placed and the upper extremity was placed in a shoulder immobilizer.  The patient tolerated the procedure well and was taken to the recovery room in stable condition.  All counts were correct in the case. The patient tolerated the procedure well and was taken to the recovery room in stable condition.    POSTOPERATIVE PLAN: She will follow the rotator cuff repair protocol.  She will be placed on aspirin for blood clot prevention.  She will be seen by physical therapy postop.  She will be seen at 2 weeks postop for suture removal  Benancio Deeds, MD 1:28 PM

## 2023-07-07 NOTE — Transfer of Care (Signed)
Immediate Anesthesia Transfer of Care Note  Patient: Lorn Junes  Procedure(s) Performed: LEFT SHOULDER ARTHROSCOPY / DEBRIDEMENT WITH ROTATOR CUFF REPAIR (Left)  Patient Location: PACU  Anesthesia Type:General  Level of Consciousness: drowsy and patient cooperative  Airway & Oxygen Therapy: Patient Spontanous Breathing and Patient connected to nasal cannula oxygen  Post-op Assessment: Report given to RN and Post -op Vital signs reviewed and stable  Post vital signs: Reviewed and stable  Last Vitals:  Vitals Value Taken Time  BP 140/80 07/07/23 1339  Temp    Pulse 76 07/07/23 1341  Resp 17 07/07/23 1341  SpO2 98 % 07/07/23 1341  Vitals shown include unfiled device data.  Last Pain:  Vitals:   07/07/23 0925  TempSrc: Temporal  PainSc: 2          Complications: No notable events documented.

## 2023-07-07 NOTE — Telephone Encounter (Signed)
  Chief Complaint: Blood sugar over 300 Symptoms: none Frequency: most mornings Pertinent Negatives: Patient denies  Disposition: [] ED /[] Urgent Care (no appt availability in office) / [] Appointment(In office/virtual)/ []  [facility]/ [] Home Care/ [] Refused Recommended Disposition /[] [facility]/ [x]  Follow-up with PCP Additional Notes: Pt is currently at a surgery center for shoulder surgery. Her BS was tested and found to be over 300. The surgery center has given her additional insulin to bring down BS. Pt was instructed to call PCP for adjustment of medications and post surgery medication. Pt states that most mornings her BS is over 300.  Please advise.    Reason for Disposition  [1] Blood glucose > 300 mg/dL (83.1 mmol/L) AND [5] two or more times in a row  Answer Assessment - Initial Assessment Questions 1. BLOOD GLUCOSE: "What is your blood glucose level?"      300 2. ONSET: "When did you check the blood glucose?"     Just now 3. USUAL RANGE: "What is your glucose level usually?" (e.g., usual fasting morning value, usual evening value)     300's 6. INSULIN: "Do you take insulin?" "What type of insulin(s) do you use? What is the mode of delivery? (syringe, pen; injection or pump)?"      Last night 16 units  Protocols used: Diabetes - High Blood Sugar-A-AH

## 2023-07-07 NOTE — Discharge Instructions (Addendum)
Discharge Instructions    Attending Surgeon: Cindy Cote, MD Office Phone Number: [phone]   Diagnosis and Procedures:    Surgeries Performed: Left shoulder rotator cuff repair  Discharge Plan:    Diet: Resume usual diet. Begin with light or bland foods.  Drink plenty of fluids.  Activity:  Keep sling and dressing in place until your follow up visit in Physical Therapy You are advised to go home directly from the hospital or surgical center. Restrict your activities.  GENERAL INSTRUCTIONS: 1.  Keep your surgical site elevated above your heart for at least 5-7 days or longer to prevent swelling. This will improve your comfort and your overall recovery following surgery.     2. Please call Dr. Serena Carson office at [phone] with questions Monday-Friday during business hours. If no one answers, please leave a message and someone should get back to the patient within 24 hours. For emergencies please call 911 or proceed to the emergency room.   3. Patient to notify surgical team if experiences any of the following: Bowel/Bladder dysfunction, uncontrolled pain, nerve/muscle weakness, incision with increased drainage or redness, nausea/vomiting and Fever greater than 101.0 F.  Be alert for signs of infection including redness, streaking, odor, fever or chills. Be alert for excessive pain or bleeding and notify your surgeon immediately.  WOUND INSTRUCTIONS:   Leave your dressing/cast/splint in place until your post operative visit.  Keep it clean and dry.  Always keep the incision clean and dry until the staples/sutures are removed. If there is no drainage from the incision you should keep it open to air. If there is drainage from the incision you must keep it covered at all times until the drainage stops  Do not soak in a bath tub, hot tub, pool, lake or other body of water until 21 days after your surgery and your incision is completely dry and healed.  If you have  removable sutures (or staples) they must be removed 10-14 days (unless otherwise instructed) from the day of your surgery.     1)  Elevate the extremity as much as possible.  2)  Keep the dressing clean and dry.  3)  Please call us if the dressing becomes wet or dirty.  4)  If you are experiencing worsening pain or worsening swelling, please call.     MEDICATIONS: Resume all previous home medications at the previous prescribed dose and frequency unless otherwise noted Start taking the  pain medications on an as-needed basis as prescribed  Please taper down pain medication over the next week following surgery.  Ideally you should not require a refill of any narcotic pain medication.  Take pain medication with food to minimize nausea. In addition to the prescribed pain medication, you may take over-the-counter pain relievers such as Tylenol.  Do NOT take additional tylenol if your pain medication already has tylenol in it.  Aspirin 325mg  daily for four weeks.      FOLLOWUP INSTRUCTIONS: 1. Follow up at the Physical Therapy Clinic 3-4 days following surgery. This appointment should be scheduled unless other arrangements have been made.The Physical Therapy scheduling number is [phone] if an appointment has not already been arranged.  2. Contact Dr. Serena Carson office during office hours at [phone] or the practice after hours line at [phone] for non-emergencies. For medical emergencies call 911.   Discharge Location: Home  May take Tylenol after 4pm, if needed.    Post Anesthesia Home Care Instructions  Activity: Get plenty  of rest for the remainder of the day. A responsible individual must stay with you for 24 hours following the procedure.  For the next 24 hours, DO NOT: -Drive a car -Advertising copywriter -Drink alcoholic beverages -Take any medication unless instructed by your physician -Make any legal decisions or sign important papers.  Meals: Start with liquid  foods such as gelatin or soup. Progress to regular foods as tolerated. Avoid greasy, spicy, heavy foods. If nausea and/or vomiting occur, drink only clear liquids until the nausea and/or vomiting subsides. Call your physician if vomiting continues.  Special Instructions/Symptoms: Your throat may feel dry or sore from the anesthesia or the breathing tube placed in your throat during surgery. If this causes discomfort, gargle with warm salt water. The discomfort should disappear within 24 hours.  If you had a scopolamine patch placed behind your ear for the management of post- operative nausea and/or vomiting:  1. The medication in the patch is effective for 72 hours, after which it should be removed.  Wrap patch in a tissue and discard in the trash. Wash hands thoroughly with soap and water. 2. You may remove the patch earlier than 72 hours if you experience unpleasant side effects which may include dry mouth, dizziness or visual disturbances. 3. Avoid touching the patch. Wash your hands with soap and water after contact with the patch.    Post Anesthesia Home Care Instructions  Activity: Get plenty of rest for the remainder of the day. A responsible individual must stay with you for 24 hours following the procedure.  For the next 24 hours, DO NOT: -Drive a car -Advertising copywriter -Drink alcoholic beverages -Take any medication unless instructed by your physician -Make any legal decisions or sign important papers.  Meals: Start with liquid foods such as gelatin or soup. Progress to regular foods as tolerated. Avoid greasy, spicy, heavy foods. If nausea and/or vomiting occur, drink only clear liquids until the nausea and/or vomiting subsides. Call your physician if vomiting continues.  Special Instructions/Symptoms: Your throat may feel dry or sore from the anesthesia or the breathing tube placed in your throat during surgery. If this causes discomfort, gargle with warm salt water. The  discomfort should disappear within 24 hours.  If you had a scopolamine patch placed behind your ear for the management of post- operative nausea and/or vomiting:  1. The medication in the patch is effective for 72 hours, after which it should be removed.  Wrap patch in a tissue and discard in the trash. Wash hands thoroughly with soap and water. 2. You may remove the patch earlier than 72 hours if you experience unpleasant side effects which may include dry mouth, dizziness or visual disturbances. 3. Avoid touching the patch. Wash your hands with soap and water after contact with the patch.   Information for Discharge Teaching: EXPAREL (bupivacaine liposome injectable suspension)   Pain relief is important to your recovery. The goal is to control your pain so you can move easier and return to your normal activities as soon as possible after your procedure. Your physician may use several types of medicines to manage pain, swelling, and more.  Your surgeon or anesthesiologist gave you EXPAREL(bupivacaine) to help control your pain after surgery.  EXPAREL is a local anesthetic designed to release slowly over an extended period of time to provide pain relief by numbing the tissue around the surgical site. EXPAREL is designed to release pain medication over time and can control pain for up to 72 hours.  Depending on how you respond to EXPAREL, you may require less pain medication during your recovery. EXPAREL can help reduce or eliminate the need for opioids during the first few days after surgery when pain relief is needed the most. EXPAREL is not an opioid and is not addictive. It does not cause sleepiness or sedation.   Important! A teal colored band has been placed on your arm with the date, time and amount of EXPAREL you have received. Please leave this armband in place for the full 96 hours following administration, and then you may remove the band. If you return to the hospital for any reason  within 96 hours following the administration of EXPAREL, the armband provides important information that your health care providers to know, and alerts them that you have received this anesthetic.    Possible side effects of EXPAREL: Temporary loss of sensation or ability to move in the area where medication was injected. Nausea, vomiting, constipation Rarely, numbness and tingling in your mouth or lips, lightheadedness, or anxiety may occur. Call your doctor right away if you think you may be experiencing any of these sensations, or if you have other questions regarding possible side effects.  Follow all other discharge instructions given to you by your surgeon or nurse. Eat a healthy diet and drink plenty of water or other fluids.

## 2023-07-07 NOTE — H&P (Signed)
Chief Complaint: Left shoulder pain        History of Present Illness:   Provider Location: [address], [place], [place] [address] Patient location: [address]  [place] [address]  Visit type: MyChart video visit  05/18/2023: Presents today for MRI video visit of the left shoulder.  At this time she is still having persistent pain with difficulty laying directly on the side.   Cindy Carson is a 52 y.o. female presenting for evaluation second opinion of left shoulder pain.  This began 1 year ago after she sustained a fall on 04/02/22.  She has been followed by [facility] for previous workup and treatment.  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 She had moderate AC joint arthrosis and subacromial bursitis. There is no evidence of a labral tear" per report.  She completed many sessions of physical therapy and was recommended for follow-up with MD after failing to progress.  She received an intra-articular left shoulder injection on 10/22/2022 and MD was also considering epidural steroid injection at that time for pre-existing cervical issues.  Patient states that she got some good relief from this injection, but feels like it is beginning to wear off.  She continues to have moderate pain and difficulty with range of motion, but also reports that it is worse while sitting at her desk at work.  Denies any night pain, numbness, or tingling.  Takes ibuprofen occasionally for pain.  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     Surgical History:   None   PMH/PSH/Family History/Social History/Meds/Allergies:         Past Medical History:  Diagnosis Date   Anemia 12/09/2018   Chronic low back pain     Chronic neck pain     Diabetes  type 2, uncontrolled      diagnosed in 2019   Hyperlipidemia 08/01/2021   Hypertension complicating diabetes (HCC)     Neck pain 07/11/2021   Obesity 12/09/2018   OSA (obstructive sleep apnea)      sleep study done in [place] in 2019 per pt   Rash 01/28/2022   Screening for colon cancer 08/01/2021             Past Surgical History:  Procedure Laterality Date   COLONOSCOPY       COLONOSCOPY N/A 09/03/2021    Procedure: COLONOSCOPY;  Surgeon: Midge Minium, MD;  Location: [facility] ENDOSCOPY;  Service: Endoscopy;  Laterality: N/A;   TUBAL LIGATION            Social History         Socioeconomic History   Marital status: Legally Separated      Spouse name: Not on file   Number of children: Not on file   Years of education: Not on file   Highest education level: Associate degree: occupational, Scientist, product/process development, or vocational program  Occupational History   Not on file  Tobacco Use   Smoking status: Never   Smokeless tobacco: Never  Vaping Use   Vaping status: Never Used  Substance and Sexual Activity   Alcohol use: Never   Drug use: Never   Sexual activity: Not Currently      Partners: Male      Birth control/protection: Surgical, Post-menopausal  Comment: BTL, First IC <16 y/o, >5 Partners, Hx of RPR, GC  Other Topics Concern   Not on file  Social History Narrative   Not on file    Social Determinants of Health        Financial Resource Strain: Medium Risk (12/21/2022)    Overall Financial Resource Strain (CARDIA)     Difficulty of Paying Living Expenses: Somewhat hard  Food Insecurity: Food Insecurity Present (12/21/2022)    Hunger Vital Sign     Worried About Running Out of Food in the Last Year: Sometimes true     Ran Out of Food in the Last Year: Sometimes true  Transportation Needs: No Transportation Needs (12/21/2022)    PRAPARE - Therapist, art (Medical): No     Lack of Transportation (Non-Medical): No  Physical Activity: Inactive  (12/21/2022)    Exercise Vital Sign     Days of Exercise per Week: 2 days     Minutes of Exercise per Session: 0 min  Stress: No Stress Concern Present (12/21/2022)    [employer] of Occupational Health - Occupational Stress Questionnaire     Feeling of Stress : Only a little  Social Connections: Socially Isolated (12/21/2022)    Social Connection and Isolation Panel [NHANES]     Frequency of Communication with Friends and Family: More than three times a week     Frequency of Social Gatherings with Friends and Family: Not on file     Attends Religious Services: Never     Database administrator or Organizations: No     Attends Engineer, structural: Not on file     Marital Status: Separated         Family History  Problem Relation Age of Onset   Varicose Veins Father     Hypertension Sister     Diabetes Sister     Diabetes Sister     Heart murmur Brother     Cancer Maternal Grandmother          Allergies       Allergies  Allergen Reactions   Nsaids Nausea Only      nausea            Current Outpatient Medications  Medication Sig Dispense Refill   amLODipine (NORVASC) 5 MG tablet Take 1 tablet by mouth once daily 90 tablet 1   atorvastatin (LIPITOR) 20 MG tablet Take 1 tablet by mouth once daily 90 tablet 1   DULoxetine (CYMBALTA) 30 MG capsule Take 1 capsule (30 mg total) by mouth daily. 90 capsule 1      No current facility-administered medications for this visit.      Imaging Results (Last 48 hours)  No results found.     Review of Systems:   A ROS was performed including pertinent positives and negatives as documented in the HPI.   Physical Exam :   Constitutional: NAD and appears stated age Neurological: Alert and oriented Psych: Appropriate affect and cooperative Last menstrual period 03/09/2019.    Comprehensive Musculoskeletal Exam:     Musculoskeletal Exam      Inspection Right Left  Skin No atrophy or winging No atrophy or winging   Palpation      Tenderness None None  Range of Motion      Flexion (passive) 170 160  Flexion (active) 160 150  Abduction 140 140  ER at the side 60 60  Can reach behind back to L4  L4 with discomfort  Strength        5/5 4/5  Special Tests      Pseudoparalytic No No  Neurologic      Fires PIN, radial, median, ulnar, musculocutaneous, axillary, suprascapular, long thoracic, and spinal accessory innervated muscles. No abnormal sensibility  Vascular/Lymphatic      Radial Pulse 2+ 2+  Cervical Exam      Patient has symmetric cervical range of motion with negative Spurling's test.  Special Test: Positive left empty can and Neer/Hawkins. Negative belly press        Imaging:   Xray review from 04/02/2022 (left shoulder 3 views): Negative   MRI left shoulder: Full-thickness tearing of the supraspinatus with healthy appearing muscle belly and no evidence of retraction   I personally reviewed and interpreted the radiographs.     Assessment:   52 y.o. female with chronic right shoulder pain ongoing for the last year.  On visit today there does not appear to be a full-thickness rotator cuff supraspinatus tear of the left shoulder.  At this time she has trialed physical therapy as well as an injection.  She does not want want to do an additional injection as she is still having persistent pain and she did plateau but was not able to relieve her pain and disability from physical therapy.  As result we did discuss arthroscopic options.  I did specifically discuss that I do believe she would benefit from left shoulder arthroscopy with rotator cuff repair.  We did discuss about the rehab associated with this.  After discussions of the risks as well as the limitations she has elected for left shoulder arthroscopy with rotator cuff repair Plan :     -Plan for left shoulder arthroscopy with rotator cuff repair   After a lengthy discussion of treatment options, including risks, benefits,  alternatives, complications of surgical and nonsurgical conservative options, the patient elected surgical repair.   The patient  is aware of the material risks  and complications including, but not limited to injury to adjacent structures, neurovascular injury, infection, numbness, bleeding, implant failure, thermal burns, stiffness, persistent pain, failure to heal, disease transmission from allograft, need for further surgery, dislocation, anesthetic risks, blood clots, risks of death,and others. The probabilities of surgical success and failure discussed with patient given their particular co-morbidities.The time and nature of expected rehabilitation and recovery was discussed.The patient's questions were all answered preoperatively.  No barriers to understanding were noted. I explained the natural history of the disease process and Rx rationale.  I explained to the patient what I considered to be reasonable expectations given their personal situation.  The final treatment plan was arrived at through a shared patient decision making process model.          I personally saw and evaluated the patient, and participated in the management and treatment plan.                       Huel Cote, MD Attending Physician, Orthopedic Surgery  This document was dictated using Dragon voice recognition software. A reasonable attempt at proof reading has been made to minimize errors.

## 2023-07-07 NOTE — Brief Op Note (Signed)
   Brief Op Note  Date of Surgery: 07/07/2023  Preoperative Diagnosis: LEFT ROTATOR CUFF TEAR  Postoperative Diagnosis: same  Procedure: Procedure(s): LEFT SHOULDER ARTHROSCOPY / DEBRIDEMENT WITH ROTATOR CUFF REPAIR  Implants: Implant Name Type Inv. Item Serial No. Manufacturer Lot No. LRB No. Used Action  ANCHOR JUGGERKNOT SOFT S-P - [id number] Anchor ANCHOR JUGGERKNOT SOFT S-P  ZIMMER RECON(ORTH,TRAU,BIO,SG) [id number] Left 1 Implanted  [facility] SUT KNTLS STRL SHLDR SYS - [id number] Anchor ANCH SUT KNTLS STRL SHLDR SYS  ZIMMER RECON(ORTH,TRAU,BIO,SG) [id number] Left 1 Implanted  TAPESTRY RC STS [id number] - [id number] Orthopedic Implant TAPESTRY RC STS [id number]  ZIMMER RECON(ORTH,TRAU,BIO,SG) [id number] Left 1 Implanted  ANCHOR DUAL INSERTER - [id number] Anchor ANCHOR DUAL INSERTER  ZIMMER RECON(ORTH,TRAU,BIO,SG) [id number] Left 1 Implanted  ANCHOR DUAL INSERTER - [id number] Anchor ANCHOR DUAL INSERTER  ZIMMER RECON(ORTH,TRAU,BIO,SG) [id number] Left 1 Implanted    Surgeons: Surgeon(s): Huel Cote, MD  Anesthesia: General    Estimated Blood Loss: See anesthesia record  Complications: None  Condition to PACU: Stable  Benancio Deeds, MD 07/07/2023 1:25 PM

## 2023-07-07 NOTE — Interval H&P Note (Signed)
History and Physical Interval Note:  07/07/2023 9:58 AM  Cindy Carson  has presented today for surgery, with the diagnosis of LEFT ROTATOR CUFF TEAR.  The various methods of treatment have been discussed with the patient and family. After consideration of risks, benefits and other options for treatment, the patient has consented to  Procedure(s): LEFT SHOULDER ARTHROSCOPY / DEBRIDEMENT WITH ROTATOR CUFF REPAIR (Left) as a surgical intervention.  The patient's history has been reviewed, patient examined, no change in status, stable for surgery.  I have reviewed the patient's chart and labs.  Questions were answered to the patient's satisfaction.     Huel Cote

## 2023-07-07 NOTE — Telephone Encounter (Signed)
Called and spoke to patient. She states that she is currently taking 16 units of insulin. States her shoulder surgery was today and that she is already home. Scheduled mychart visit for the patient for tomorrow per provider.

## 2023-07-07 NOTE — Anesthesia Postprocedure Evaluation (Signed)
Anesthesia Post Note  Patient: Cindy Carson  Procedure(s) Performed: LEFT SHOULDER ARTHROSCOPY / DEBRIDEMENT WITH ROTATOR CUFF REPAIR (Left)     Patient location during evaluation: PACU Anesthesia Type: Regional and General Level of consciousness: awake and alert Pain management: pain level controlled Vital Signs Assessment: post-procedure vital signs reviewed and stable Respiratory status: spontaneous breathing, nonlabored ventilation, respiratory function stable and patient connected to nasal cannula oxygen Cardiovascular status: blood pressure returned to baseline and stable Postop Assessment: no apparent nausea or vomiting Anesthetic complications: no   No notable events documented.  Last Vitals:  Vitals:   07/07/23 1400 07/07/23 1426  BP: 134/78 138/89  Pulse: 77 80  Resp: 20 18  Temp:  (!) 36.3 C  SpO2: 97% 96%    Last Pain:  Vitals:   07/07/23 1426  TempSrc:   PainSc: 0-No pain                 Trevor Iha

## 2023-07-07 NOTE — Anesthesia Procedure Notes (Signed)
Anesthesia Regional Block: Interscalene brachial plexus block   Pre-Anesthetic Checklist: , timeout performed,  Correct Patient, Correct Site, Correct Laterality,  Correct Procedure, Correct Position, site marked,  Risks and benefits discussed,  Surgical consent,  Pre-op evaluation,  At surgeon's request and post-op pain management  Laterality: Upper and Left  Prep: Maximum Sterile Barrier Precautions used, chloraprep       Needles:  Injection technique: Single-shot  Needle Type: Echogenic Needle     Needle Length: 5cm  Needle Gauge: 21     Additional Needles:   Procedures:,,,, ultrasound used (permanent image in chart),,    Narrative:  Start time: 07/07/2023 10:36 AM End time: 07/07/2023 10:42 AM Injection made incrementally with aspirations every 5 mL.  Performed by: Personally  Anesthesiologist: Trevor Iha, MD  Additional Notes: Block assessed prior to procedure. Patient tolerated procedure well.

## 2023-07-07 NOTE — Telephone Encounter (Signed)
Please find out how many units patient is currently using?   Find out when her shoulder surgery is, when she will be home and if she can do a virtual visit when she gets home?

## 2023-07-07 NOTE — Progress Notes (Signed)
Assisted Dr. Richardson Landry with left, interscalene , ultrasound guided block. Side rails up, monitors on throughout procedure. See vital signs in flow sheet. Tolerated Procedure well.

## 2023-07-07 NOTE — Anesthesia Procedure Notes (Signed)
Procedure Name: Intubation Date/Time: 07/07/2023 11:42 AM  Performed by: Earmon Phoenix, CRNAPre-anesthesia Checklist: Patient identified, Emergency Drugs available, Suction available, Timeout performed and Patient being monitored Patient Re-evaluated:Patient Re-evaluated prior to induction Oxygen Delivery Method: Circle system utilized Preoxygenation: Pre-oxygenation with 100% oxygen Induction Type: IV induction Ventilation: Mask ventilation without difficulty Laryngoscope Size: Mac and 3 Grade View: Grade I Tube type: Oral Tube size: 7.0 mm Number of attempts: 1 Airway Equipment and Method: Stylet Placement Confirmation: positive ETCO2, breath sounds checked- equal and bilateral and ETT inserted through vocal cords under direct vision Secured at: 22 cm Tube secured with: Tape Dental Injury: Teeth and Oropharynx as per pre-operative assessment

## 2023-07-07 NOTE — Telephone Encounter (Signed)
Routing to provider to advise.  

## 2023-07-08 ENCOUNTER — Telehealth (INDEPENDENT_AMBULATORY_CARE_PROVIDER_SITE_OTHER): Payer: 59 | Admitting: Nurse Practitioner

## 2023-07-08 ENCOUNTER — Other Ambulatory Visit (HOSPITAL_BASED_OUTPATIENT_CLINIC_OR_DEPARTMENT_OTHER): Payer: Self-pay

## 2023-07-08 ENCOUNTER — Encounter: Payer: Self-pay | Admitting: Nurse Practitioner

## 2023-07-08 ENCOUNTER — Other Ambulatory Visit (HOSPITAL_BASED_OUTPATIENT_CLINIC_OR_DEPARTMENT_OTHER): Payer: Self-pay | Admitting: Orthopaedic Surgery

## 2023-07-08 ENCOUNTER — Ambulatory Visit: Payer: 59 | Admitting: Nurse Practitioner

## 2023-07-08 ENCOUNTER — Telehealth (HOSPITAL_BASED_OUTPATIENT_CLINIC_OR_DEPARTMENT_OTHER): Payer: Self-pay | Admitting: Orthopaedic Surgery

## 2023-07-08 DIAGNOSIS — E1165 Type 2 diabetes mellitus with hyperglycemia: Secondary | ICD-10-CM

## 2023-07-08 DIAGNOSIS — Z7985 Long-term (current) use of injectable non-insulin antidiabetic drugs: Secondary | ICD-10-CM | POA: Diagnosis not present

## 2023-07-08 MED ORDER — HYDROCODONE-ACETAMINOPHEN 5-325 MG PO TABS
1.0000 | ORAL_TABLET | Freq: Four times a day (QID) | ORAL | 0 refills | Status: DC | PRN
Start: 2023-07-08 — End: 2023-08-05
  Filled 2023-07-08: qty 20, 5d supply, fill #0

## 2023-07-08 NOTE — Assessment & Plan Note (Signed)
Chronic.  Not well controlled.  Had shoulder surgery yesterday and sugar was over 300. Recommend restarting Ozempic.  Increase Semglee from 16u to 30u.  Check sugars and keep log.  Follow up next week for reevaluation.

## 2023-07-08 NOTE — Telephone Encounter (Signed)
Patient states that she can not take the oxycodone it makes her nauseas. Could you send something else to pharmacy for her pain

## 2023-07-08 NOTE — Progress Notes (Signed)
Appointment has been made

## 2023-07-08 NOTE — Progress Notes (Signed)
LMP 03/09/2019    Subjective:    Patient ID: Cindy Carson, female    DOB: 1971-05-13, 52 y.o.   MRN: [id number]  HPI: Cindy Carson is a 52 y.o. female  Chief Complaint  Patient presents with   Diabetes   DIABETES Patient states she has been checking blood sugars at home and they are running in the 300s.  She hasn't been taking the Ozempic for a couple of weeks but plans to restart it.  She went to 16u of Semglee 2 days ago.  Denies blurred vision, headaches, polyuria and polydipsia.    Relevant past medical, surgical, family and social history reviewed and updated as indicated. Interim medical history since our last visit reviewed. Allergies and medications reviewed and updated.  Review of Systems  Endocrine: Negative for polydipsia and polyuria.    Per HPI unless specifically indicated above     Objective:    LMP 03/09/2019   Wt Readings from Last 3 Encounters:  07/07/23 183 lb 3.2 oz (83.1 kg)  06/22/23 184 lb (83.5 kg)  03/05/23 193 lb 3.2 oz (87.6 kg)    Physical Exam Vitals and nursing note reviewed.  HENT:     Head: Normocephalic.     Right Ear: Hearing normal.     Left Ear: Hearing normal.     Nose: Nose normal.  Eyes:     Pupils: Pupils are equal, round, and reactive to light.  Pulmonary:     Effort: Pulmonary effort is normal. No respiratory distress.  Neurological:     Mental Status: She is alert.  Psychiatric:        Mood and Affect: Mood normal.        Behavior: Behavior normal.        Thought Content: Thought content normal.        Judgment: Judgment normal.     Results for orders placed or performed during the hospital encounter of 07/07/23  Glucose, capillary  Result Value Ref Range   Glucose-Capillary 332 (H) 70 - 99 mg/dL  Glucose, capillary  Result Value Ref Range   Glucose-Capillary 318 (H) 70 - 99 mg/dL  Glucose, capillary  Result Value Ref Range   Glucose-Capillary 279 (H) 70 - 99 mg/dL  Glucose, capillary  Result Value Ref Range    Glucose-Capillary 237 (H) 70 - 99 mg/dL   Comment 1 Notify RN    Comment 2 Document in Chart   Glucose, capillary  Result Value Ref Range   Glucose-Capillary 230 (H) 70 - 99 mg/dL  Glucose, capillary  Result Value Ref Range   Glucose-Capillary 208 (H) 70 - 99 mg/dL      Assessment & Plan:   Problem List Items Addressed This Visit       Endocrine   Type 2 diabetes mellitus with hyperglycemia, without long-term current use of insulin (HCC) - Primary    Chronic.  Not well controlled.  Had shoulder surgery yesterday and sugar was over 300. Recommend restarting Ozempic.  Increase Semglee from 16u to 30u.  Check sugars and keep log.  Follow up next week for reevaluation.        Follow up plan: Return in about 1 week (around 07/15/2023) for Medication Management.  This visit was completed via MyChart due to the restrictions of the COVID-19 pandemic. All issues as above were discussed and addressed. Physical exam was done as above through visual confirmation on MyChart. If it was felt that the patient should be evaluated in the office, they  were directed there. The patient verbally consented to this visit. Location of the patient: Home Location of the provider: Office Those involved with this call:  Provider: Larae Grooms, NP CMA: Wilhemena Durie, CMA Front Desk/Registration: Servando Snare This encounter was conducted via video.  I spent 30 dedicated to the care of this patient on the date of this encounter to include previsit review of symptoms, plan of care and follow up, face to face time with the patient, and post visit ordering of testing.

## 2023-07-10 ENCOUNTER — Other Ambulatory Visit: Payer: Self-pay

## 2023-07-10 ENCOUNTER — Ambulatory Visit (HOSPITAL_BASED_OUTPATIENT_CLINIC_OR_DEPARTMENT_OTHER): Payer: 59 | Attending: Orthopaedic Surgery | Admitting: Physical Therapy

## 2023-07-10 ENCOUNTER — Encounter (HOSPITAL_BASED_OUTPATIENT_CLINIC_OR_DEPARTMENT_OTHER): Payer: Self-pay | Admitting: Physical Therapy

## 2023-07-10 DIAGNOSIS — M25612 Stiffness of left shoulder, not elsewhere classified: Secondary | ICD-10-CM | POA: Insufficient documentation

## 2023-07-10 DIAGNOSIS — Z4789 Encounter for other orthopedic aftercare: Secondary | ICD-10-CM | POA: Insufficient documentation

## 2023-07-10 DIAGNOSIS — M25512 Pain in left shoulder: Secondary | ICD-10-CM | POA: Diagnosis not present

## 2023-07-10 DIAGNOSIS — M6281 Muscle weakness (generalized): Secondary | ICD-10-CM | POA: Diagnosis not present

## 2023-07-10 DIAGNOSIS — M75122 Complete rotator cuff tear or rupture of left shoulder, not specified as traumatic: Secondary | ICD-10-CM | POA: Diagnosis present

## 2023-07-10 NOTE — Therapy (Signed)
OUTPATIENT PHYSICAL THERAPY EVALUATION   Patient Name: Cindy Carson MRN: [id number] DOB:1971/03/18, 52 y.o., female Today's Date: 07/10/2023  END OF SESSION:  PT End of Session - 07/10/23 0853     Visit Number 1    Number of Visits 25    Date for PT Re-Evaluation 10/03/23    Authorization Type U[employer]    PT Start Time 0852    PT Stop Time 0927    PT Time Calculation (min) 35 min    Activity Tolerance Patient tolerated treatment well;Patient limited by pain    Behavior During Therapy [facility] for tasks assessed/performed             Past Medical History:  Diagnosis Date   Anemia 12/09/2018   Chronic low back pain    Chronic neck pain    Diabetes type 2, uncontrolled    diagnosed in 2019   Hyperlipidemia 08/01/2021   Hypertension complicating diabetes (HCC)    Neck pain 07/11/2021   Obesity 12/09/2018   OSA (obstructive sleep apnea)    sleep study done in [place] in 2019 per pt   Rash 01/28/2022   Screening for colon cancer 08/01/2021   Past Surgical History:  Procedure Laterality Date   COLONOSCOPY     COLONOSCOPY N/A 09/03/2021   Procedure: COLONOSCOPY;  Surgeon: Midge Minium, MD;  Location: [facility] ENDOSCOPY;  Service: Endoscopy;  Laterality: N/A;   TUBAL LIGATION     Patient Active Problem List   Diagnosis Date Noted   Nontraumatic complete tear of left rotator cuff 07/07/2023   Type 2 diabetes mellitus with hyperglycemia, without long-term current use of insulin (HCC) 07/25/2022   Depression, recurrent (HCC) 07/25/2022   Encounter for screening for malignant neoplasm of breast 07/11/2021   Hyperlipidemia associated with type 2 diabetes mellitus (HCC) 07/11/2021   Personal history of noncompliance with medical treatment, presenting hazards to health 02/15/2019   History of syphilis 12/09/2018   Bursitis of hip 12/09/2018   Anemia 12/09/2018   Obesity 12/09/2018   OSA (obstructive sleep apnea)    HTN (hypertension)    Hypertension complicating diabetes (HCC)     Chronic low back pain    Chronic neck pain      REFERRING PROVIDER:  Huel Cote, MD    REFERRING DIAG: [phone] (ICD-10-CM) - Nontraumatic complete tear of left rotator cuff  S/p Lt RCR  Rationale for Evaluation and Treatment: Rehabilitation  THERAPY DIAG:  Muscle weakness (generalized)  Acute pain of left shoulder  Stiffness of left shoulder, not elsewhere classified  ONSET DATE: DOS 07/07/23   SUBJECTIVE:  SUBJECTIVE STATEMENT: It is hurting. Pt reports a HA. Taking aspirin as Rx. Hydrocone at 4AM, just took tylenol not too long ago.  PERTINENT HISTORY:  DMT2, anemia, chronic neck pain  PAIN:  Are you having pain? Yes: NPRS scale: 3/10 Pain location: Lt shoulder Pain description: ache, stab Aggravating factors: constant since surgery Relieving factors: meds  PRECAUTIONS:  Shoulder  RED FLAGS: None   WEIGHT BEARING RESTRICTIONS:  Yes POSTOPERATIVE PLAN: She will follow the rotator cuff repair protocol.  She will be placed on aspirin for blood clot prevention.  She will be seen by physical therapy postop.  She will be seen at 2 weeks postop for suture removal  FALLS:  Has patient fallen in last 6 months? 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   PLOF:  Independent  PATIENT GOALS:  Be able to move it.    OBJECTIVE:  Note: Objective measures were completed at Evaluation unless otherwise noted.  PATIENT SURVEYS:  FOTO 13  COGNITIVE STATUS: Within functional limits for tasks assessed   SENSATION: WFL  EDEMA:  No  POSTURE:  Eval: forward rounded shoulder as expected in sling  HAND DOMINANCE:  Right     Body Part #1 Shoulder  PALPATION: Eval: very guarded motion through  shoulder  UPPER EXTREMITY ROM:  Passive ROM Right/Lt Eval 10/18   Shoulder flexion 20   Shoulder extension    Shoulder abduction 0   Shoulder adduction    Shoulder extension    Shoulder internal rotation    Shoulder external rotation -10   Elbow flexion full   Elbow extension -20    (Blank rows = not tested)      TREATMENT:                                                                                                                               DATE: eval 07/10/23 Prom to shoulder Changed bandages- no s/s of infection See HEP     PATIENT EDUCATION:  Education details: Anatomy of condition, POC, HEP, exercise form/rationale Person educated: Patient Education method: Explanation, Demonstration, Tactile cues, Verbal cues, and Handouts Education comprehension: verbalized understanding, returned demonstration, verbal cues required, tactile cues required, and needs further education  HOME EXERCISE PROGRAM: [id number]   ASSESSMENT:  CLINICAL IMPRESSION: Patient is a 52 y.o. F who was seen today for physical therapy evaluation and treatment for Lt RCR. Very minimal tolerance to passive motion and very guarded. Asked that she remove her sling when at rest at home with a pillow propped under her upper arm.    REHAB POTENTIAL: Good  CLINICAL DECISION MAKING: Stable/uncomplicated  EVALUATION COMPLEXITY: Low   GOALS: Goals reviewed with patient? Yes  SHORT TERM GOALS: Target date: 4 wks (08/04/23)  Passive flexion to 140 deg without increase in pain Baseline: Goal status: INITIAL  2.  Passive abd to 60 deg without  increase in pain Baseline:  Goal status: INITIAL  3.  Passive ER at side to 40 deg without increase in pain Baseline:  Goal status: INITIAL  4.  Able to demo proper scapular retraction for proximal shoulder girdle support Baseline:  Goal status: INITIAL   LONG TERM GOALS: Target date: POC DATE  Able to demo AROM to shoulder height, against  gravity, with good scapular control Baseline:  Goal status: INITIAL  Target date:09/04/23 8 weeks  2.  Will begin light resistance exercises pain <=3/10 Baseline:  Goal status: INITIAL Target date:  8 weeks  3.  Full AROM within 10 deg of opp UE without increase in pain Baseline:  Goal status: INITIAL Target date: 10/02/23 12 weeks  4.  Proper scapular control in proprioceptive and CKC strengthening Baseline:  Goal status: INITIAL Target date: 10/03/23  12 weeks  5.  Able to lift cups/plates and other small objects into overhead cabinets without limitation by shoulder Baseline:  Goal status: INITIAL Target date: 10/03/23  12 weeks    PLAN:  PT FREQUENCY: 1-2x/week  PT DURATION: 12 weeks  PLANNED INTERVENTIONS: 97164- PT Re-evaluation, 97110-Therapeutic exercises, 97530- Therapeutic activity, 97112- Neuromuscular re-education, 97535- Self Care, 44010- Manual therapy, [id number]- Aquatic Therapy, 97014- Electrical stimulation (unattended), [phone]- Traction (mechanical), Patient/Family education, Taping, Dry Needling, Joint mobilization, Spinal mobilization, Scar mobilization, Cryotherapy, and Moist heat.  PLAN FOR NEXT SESSION: continue PROM per protocol   Reyanne Hussar C. Keyli Duross PT, DPT 07/10/23 11:48 AM

## 2023-07-14 ENCOUNTER — Other Ambulatory Visit (HOSPITAL_BASED_OUTPATIENT_CLINIC_OR_DEPARTMENT_OTHER): Payer: Self-pay

## 2023-07-14 ENCOUNTER — Encounter: Payer: Self-pay | Admitting: Nurse Practitioner

## 2023-07-14 ENCOUNTER — Telehealth: Payer: 59 | Admitting: Nurse Practitioner

## 2023-07-14 DIAGNOSIS — Z7985 Long-term (current) use of injectable non-insulin antidiabetic drugs: Secondary | ICD-10-CM | POA: Diagnosis not present

## 2023-07-14 DIAGNOSIS — E1165 Type 2 diabetes mellitus with hyperglycemia: Secondary | ICD-10-CM

## 2023-07-14 DIAGNOSIS — Z7984 Long term (current) use of oral hypoglycemic drugs: Secondary | ICD-10-CM | POA: Diagnosis not present

## 2023-07-14 MED ORDER — GLIPIZIDE 5 MG PO TABS
5.0000 mg | ORAL_TABLET | Freq: Two times a day (BID) | ORAL | 1 refills | Status: DC
  Filled 2023-07-14: qty 180, 90d supply, fill #0
  Filled 2023-10-31: qty 180, 90d supply, fill #1

## 2023-07-14 NOTE — Progress Notes (Signed)
Left message on machine for patient to call and schedule a 2 week follow up appt

## 2023-07-14 NOTE — Assessment & Plan Note (Addendum)
Chronic.  Not well controlled.  Sugars have improved to under 300. Recommend restarting Ozempic.  Also added Glipizide 5mg  BID.  Side effects and benefits of medication discussed during visit.  Increase Semglee from 30u to 40u.  Check sugars and keep log.  Follow up in 2 weeks for reevaluation.

## 2023-07-14 NOTE — Progress Notes (Signed)
LMP 03/09/2019    Subjective:    Patient ID: Cindy Carson, female    DOB: 1970-11-23, 52 y.o.   MRN: [id number]  HPI: Cindy Carson is a 52 y.o. female  Chief Complaint  Patient presents with   Diabetes   DIABETES Patient states she has been checking blood sugars at home and they are running in the 240s.  She is under 300 most of the time.  She has forgotten to take her ozempic.  She hasn't been taking the Ozempic for a couple of weeks but plans to restart it.  She went to 30u of Semglee 1 week ago.  Denies blurred vision, headaches, polyuria and polydipsia.    Relevant past medical, surgical, family and social history reviewed and updated as indicated. Interim medical history since our last visit reviewed. Allergies and medications reviewed and updated.  Review of Systems  Endocrine: Negative for polydipsia and polyuria.    Per HPI unless specifically indicated above     Objective:    LMP 03/09/2019   Wt Readings from Last 3 Encounters:  07/07/23 183 lb 3.2 oz (83.1 kg)  06/22/23 184 lb (83.5 kg)  03/05/23 193 lb 3.2 oz (87.6 kg)    Physical Exam Vitals and nursing note reviewed.  HENT:     Head: Normocephalic.     Right Ear: Hearing normal.     Left Ear: Hearing normal.     Nose: Nose normal.  Eyes:     Pupils: Pupils are equal, round, and reactive to light.  Pulmonary:     Effort: Pulmonary effort is normal. No respiratory distress.  Neurological:     Mental Status: She is alert.  Psychiatric:        Mood and Affect: Mood normal.        Behavior: Behavior normal.        Thought Content: Thought content normal.        Judgment: Judgment normal.     Results for orders placed or performed during the hospital encounter of 07/07/23  Glucose, capillary  Result Value Ref Range   Glucose-Capillary 332 (H) 70 - 99 mg/dL  Glucose, capillary  Result Value Ref Range   Glucose-Capillary 318 (H) 70 - 99 mg/dL  Glucose, capillary  Result Value Ref Range    Glucose-Capillary 279 (H) 70 - 99 mg/dL  Glucose, capillary  Result Value Ref Range   Glucose-Capillary 237 (H) 70 - 99 mg/dL   Comment 1 Notify RN    Comment 2 Document in Chart   Glucose, capillary  Result Value Ref Range   Glucose-Capillary 230 (H) 70 - 99 mg/dL  Glucose, capillary  Result Value Ref Range   Glucose-Capillary 208 (H) 70 - 99 mg/dL      Assessment & Plan:   Problem List Items Addressed This Visit       Endocrine   Type 2 diabetes mellitus with hyperglycemia, without long-term current use of insulin (HCC)    Chronic.  Not well controlled.  Sugars have improved to under 300. Recommend restarting Ozempic.  Also added Glipizide 5mg  BID.  Side effects and benefits of medication discussed during visit.  Increase Semglee from 30u to 40u.  Check sugars and keep log.  Follow up in 2 weeks for reevaluation.      Relevant Medications   glipiZIDE (GLUCOTROL) 5 MG tablet      Follow up plan: Return in about 2 weeks (around 07/28/2023) for Medication Management (virtual).  This visit was completed via  MyChart due to the restrictions of the COVID-19 pandemic. All issues as above were discussed and addressed. Physical exam was done as above through visual confirmation on MyChart. If it was felt that the patient should be evaluated in the office, they were directed there. The patient verbally consented to this visit. Location of the patient: Home Location of the provider: Office Those involved with this call:  Provider: Larae Grooms, NP CMA: Wilhemena Durie, CMA Front Desk/Registration: Servando Snare This encounter was conducted via video.  I spent 30 dedicated to the care of this patient on the date of this encounter to include previsit review of symptoms, plan of care and follow up, face to face time with the patient, and post visit ordering of testing.

## 2023-07-15 ENCOUNTER — Encounter (HOSPITAL_BASED_OUTPATIENT_CLINIC_OR_DEPARTMENT_OTHER): Payer: Self-pay | Admitting: Orthopaedic Surgery

## 2023-07-15 ENCOUNTER — Telehealth (HOSPITAL_BASED_OUTPATIENT_CLINIC_OR_DEPARTMENT_OTHER): Payer: Self-pay | Admitting: Orthopaedic Surgery

## 2023-07-15 ENCOUNTER — Other Ambulatory Visit (HOSPITAL_BASED_OUTPATIENT_CLINIC_OR_DEPARTMENT_OTHER): Payer: Self-pay

## 2023-07-15 ENCOUNTER — Ambulatory Visit (HOSPITAL_BASED_OUTPATIENT_CLINIC_OR_DEPARTMENT_OTHER): Payer: 59 | Admitting: Physical Therapy

## 2023-07-15 DIAGNOSIS — G8929 Other chronic pain: Secondary | ICD-10-CM

## 2023-07-15 DIAGNOSIS — Z4789 Encounter for other orthopedic aftercare: Secondary | ICD-10-CM | POA: Diagnosis not present

## 2023-07-15 DIAGNOSIS — M6281 Muscle weakness (generalized): Secondary | ICD-10-CM | POA: Diagnosis not present

## 2023-07-15 DIAGNOSIS — M25612 Stiffness of left shoulder, not elsewhere classified: Secondary | ICD-10-CM

## 2023-07-15 DIAGNOSIS — M25512 Pain in left shoulder: Secondary | ICD-10-CM

## 2023-07-15 NOTE — Telephone Encounter (Signed)
Called patient and asked her to upload a picture of rash to MyChart

## 2023-07-15 NOTE — Telephone Encounter (Signed)
Patient is a little concerned about the redness on her shoulder. She got the bandage taken of at PT today and would like to know if there is anything she should be doing. Best contact number [phone]

## 2023-07-15 NOTE — Therapy (Signed)
OUTPATIENT PHYSICAL THERAPY EVALUATION   Patient Name: Cindy Carson MRN: [id number] DOB:06-Jun-1971, 52 y.o., female Today's Date: 07/15/2023  END OF SESSION:    Past Medical History:  Diagnosis Date   Anemia 12/09/2018   Chronic low back pain    Chronic neck pain    Diabetes type 2, uncontrolled    diagnosed in 2019   Hyperlipidemia 08/01/2021   Hypertension complicating diabetes (HCC)    Neck pain 07/11/2021   Obesity 12/09/2018   OSA (obstructive sleep apnea)    sleep study done in [place] in 2019 per pt   Rash 01/28/2022   Screening for colon cancer 08/01/2021   Past Surgical History:  Procedure Laterality Date   COLONOSCOPY     COLONOSCOPY N/A 09/03/2021   Procedure: COLONOSCOPY;  Surgeon: Midge Minium, MD;  Location: [facility] ENDOSCOPY;  Service: Endoscopy;  Laterality: N/A;   TUBAL LIGATION     Patient Active Problem List   Diagnosis Date Noted   Nontraumatic complete tear of left rotator cuff 07/07/2023   Type 2 diabetes mellitus with hyperglycemia, without long-term current use of insulin (HCC) 07/25/2022   Depression, recurrent (HCC) 07/25/2022   Encounter for screening for malignant neoplasm of breast 07/11/2021   Hyperlipidemia associated with type 2 diabetes mellitus (HCC) 07/11/2021   Personal history of noncompliance with medical treatment, presenting hazards to health 02/15/2019   History of syphilis 12/09/2018   Bursitis of hip 12/09/2018   Anemia 12/09/2018   Obesity 12/09/2018   OSA (obstructive sleep apnea)    HTN (hypertension)    Hypertension complicating diabetes (HCC)    Chronic low back pain    Chronic neck pain      REFERRING PROVIDER:  Huel Cote, MD    REFERRING DIAG: [id number] (ICD-10-CM) - Nontraumatic complete tear of left rotator cuff  S/p Lt RCR  Rationale for Evaluation and Treatment: Rehabilitation  THERAPY DIAG:  No diagnosis found.  ONSET DATE: DOS 07/07/23   SUBJECTIVE:                                                                                                                                                                                            SUBJECTIVE STATEMENT: The patient reports overall it is not too bad. Her pain is controlled. Her bandage is itching her.  PERTINENT HISTORY:  DMT2, anemia, chronic neck pain  PAIN:  Are you having pain? Yes: NPRS scale: 3/10 Pain location: Lt shoulder Pain description: ache, stab Aggravating factors: constant since surgery Relieving factors: meds  PRECAUTIONS:  Shoulder  RED FLAGS: None   WEIGHT BEARING RESTRICTIONS:  Yes POSTOPERATIVE PLAN: She will follow the  rotator cuff repair protocol.  She will be placed on aspirin for blood clot prevention.  She will be seen by physical therapy postop.  She will be seen at 2 weeks postop for suture removal  FALLS:  Has patient fallen in last 6 months? 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   PLOF:  Independent  PATIENT GOALS:  Be able to move it.    OBJECTIVE:  Note: Objective measures were completed at Evaluation unless otherwise noted.  PATIENT SURVEYS:  FOTO 13  COGNITIVE STATUS: Within functional limits for tasks assessed   SENSATION: WFL  EDEMA:  No  POSTURE:  Eval: forward rounded shoulder as expected in sling  HAND DOMINANCE:  Right     Body Part #1 Shoulder  PALPATION: Eval: very guarded motion through shoulder  UPPER EXTREMITY ROM:  Passive ROM Right/Lt Eval 10/18   Shoulder flexion 20   Shoulder extension    Shoulder abduction 0   Shoulder adduction    Shoulder extension    Shoulder internal rotation    Shoulder external rotation -10   Elbow flexion full   Elbow extension -20    (Blank rows = not tested)      TREATMENT:                                                                                                                               10/23 Manual: gentle PROM  into flexion and ER; trigger point release to upper trap; gentle inferior and posterior glides    DATE: eval 07/10/23 Prom to shoulder Changed bandages- no s/s of infection See HEP     PATIENT EDUCATION:  Education details: Anatomy of condition, POC, HEP, exercise form/rationale Person educated: Patient Education method: Explanation, Demonstration, Tactile cues, Verbal cues, and Handouts Education comprehension: verbalized understanding, returned demonstration, verbal cues required, tactile cues required, and needs further education  HOME EXERCISE PROGRAM: [id number]   ASSESSMENT:  CLINICAL IMPRESSION: The patient was guarded today. With cuing to relax her ROM improved. We reviewed pendulums as a way to relax the arm. She will work on them 3-4x a day. She also has had some pain in her elbow. She was advised self elbow flexion and extension should help that. She had some itching and a rash developing under the tegaderm. Therapy talked with Kerby Less who suggested bandaids and keeping it dry. We reviewed her HEP.     Eval:Patient is a 52 y.o. F who was seen today for physical therapy evaluation and treatment for Lt RCR. Very minimal tolerance to passive motion and very guarded. Asked that she remove her sling when at rest at home with a pillow propped under her upper arm.    REHAB POTENTIAL: Good  CLINICAL DECISION MAKING: Stable/uncomplicated  EVALUATION COMPLEXITY: Low   GOALS: Goals reviewed with patient? Yes  SHORT TERM GOALS: Target date: 4 wks (08/04/23)  Passive flexion to 140 deg without increase in pain Baseline: Goal status: INITIAL  2.  Passive abd to 60 deg without increase in pain Baseline:  Goal status: INITIAL  3.  Passive ER at side to 40 deg without increase in pain Baseline:  Goal status: INITIAL  4.  Able to demo proper scapular retraction for proximal shoulder girdle support Baseline:  Goal status: INITIAL   LONG TERM GOALS: Target date:  POC DATE  Able to demo AROM to shoulder height, against gravity, with good scapular control Baseline:  Goal status: INITIAL  Target date:09/04/23 8 weeks  2.  Will begin light resistance exercises pain <=3/10 Baseline:  Goal status: INITIAL Target date:  8 weeks  3.  Full AROM within 10 deg of opp UE without increase in pain Baseline:  Goal status: INITIAL Target date: 10/02/23 12 weeks  4.  Proper scapular control in proprioceptive and CKC strengthening Baseline:  Goal status: INITIAL Target date: 10/03/23  12 weeks  5.  Able to lift cups/plates and other small objects into overhead cabinets without limitation by shoulder Baseline:  Goal status: INITIAL Target date: 10/03/23  12 weeks    PLAN:  PT FREQUENCY: 1-2x/week  PT DURATION: 12 weeks  PLANNED INTERVENTIONS: 97164- PT Re-evaluation, 97110-Therapeutic exercises, 97530- Therapeutic activity, 97112- Neuromuscular re-education, 97535- Self Care, 16109- Manual therapy, [id number]- Aquatic Therapy, 97014- Electrical stimulation (unattended), [phone]- Traction (mechanical), Patient/Family education, Taping, Dry Needling, Joint mobilization, Spinal mobilization, Scar mobilization, Cryotherapy, and Moist heat.  PLAN FOR NEXT SESSION: continue PROM per protocol   Jessica C. Hightower PT, DPT 07/15/23 9:40 AM

## 2023-07-20 ENCOUNTER — Ambulatory Visit (INDEPENDENT_AMBULATORY_CARE_PROVIDER_SITE_OTHER): Payer: 59 | Admitting: Orthopaedic Surgery

## 2023-07-20 ENCOUNTER — Ambulatory Visit (HOSPITAL_BASED_OUTPATIENT_CLINIC_OR_DEPARTMENT_OTHER): Payer: 59 | Admitting: Physical Therapy

## 2023-07-20 ENCOUNTER — Encounter (HOSPITAL_BASED_OUTPATIENT_CLINIC_OR_DEPARTMENT_OTHER): Payer: Self-pay | Admitting: Physical Therapy

## 2023-07-20 DIAGNOSIS — M25612 Stiffness of left shoulder, not elsewhere classified: Secondary | ICD-10-CM | POA: Diagnosis not present

## 2023-07-20 DIAGNOSIS — M25512 Pain in left shoulder: Secondary | ICD-10-CM

## 2023-07-20 DIAGNOSIS — Z4789 Encounter for other orthopedic aftercare: Secondary | ICD-10-CM | POA: Diagnosis not present

## 2023-07-20 DIAGNOSIS — M6281 Muscle weakness (generalized): Secondary | ICD-10-CM

## 2023-07-20 DIAGNOSIS — M75122 Complete rotator cuff tear or rupture of left shoulder, not specified as traumatic: Secondary | ICD-10-CM

## 2023-07-20 NOTE — Therapy (Signed)
OUTPATIENT PHYSICAL THERAPY EVALUATION   Patient Name: Cindy Carson MRN: [id number] DOB:[birth year], 51 y.o., female Today's Date: 07/20/2023  END OF SESSION:    Past Medical History:  Diagnosis Date   Anemia 12/09/2018   Chronic low back pain    Chronic neck pain    Diabetes type 2, uncontrolled    diagnosed in 2019   Hyperlipidemia 08/01/2021   Hypertension complicating diabetes (HCC)    Neck pain 07/11/2021   Obesity 12/09/2018   OSA (obstructive sleep apnea)    sleep study done in [place] in 2019 per pt   Rash 01/28/2022   Screening for colon cancer 08/01/2021   Past Surgical History:  Procedure Laterality Date   COLONOSCOPY     COLONOSCOPY N/A 09/03/2021   Procedure: COLONOSCOPY;  Surgeon: Midge Minium, MD;  Location: [facility] ENDOSCOPY;  Service: Endoscopy;  Laterality: N/A;   TUBAL LIGATION     Patient Active Problem List   Diagnosis Date Noted   Nontraumatic complete tear of left rotator cuff 07/07/2023   Type 2 diabetes mellitus with hyperglycemia, without long-term current use of insulin (HCC) 07/25/2022   Depression, recurrent (HCC) 07/25/2022   Encounter for screening for malignant neoplasm of breast 07/11/2021   Hyperlipidemia associated with type 2 diabetes mellitus (HCC) 07/11/2021   Personal history of noncompliance with medical treatment, presenting hazards to health 02/15/2019   History of syphilis 12/09/2018   Bursitis of hip 12/09/2018   Anemia 12/09/2018   Obesity 12/09/2018   OSA (obstructive sleep apnea)    HTN (hypertension)    Hypertension complicating diabetes (HCC)    Chronic low back pain    Chronic neck pain      REFERRING PROVIDER:  Huel Cote, MD    REFERRING DIAG: [id number] (ICD-10-CM) - Nontraumatic complete tear of left rotator cuff  S/p Lt RCR  Rationale for Evaluation and Treatment: Rehabilitation  THERAPY DIAG:  No diagnosis found.  ONSET DATE: DOS 07/07/23   SUBJECTIVE:                                                                                                                                                                                            SUBJECTIVE STATEMENT: The patient just had her stitches removed. It is more sensitive.  PERTINENT HISTORY:  DMT2, anemia, chronic neck pain  PAIN:  Are you having pain? Yes: NPRS scale: 5/10 Pain location: Lt shoulder Pain description: ache, stab Aggravating factors: constant since surgery Relieving factors: meds  PRECAUTIONS:  Shoulder  RED FLAGS: None   WEIGHT BEARING RESTRICTIONS:  Yes POSTOPERATIVE PLAN: She will follow the rotator cuff repair protocol.  She will  be placed on aspirin for blood clot prevention.  She will be seen by physical therapy postop.  She will be seen at 2 weeks postop for suture removal  FALLS:  Has patient fallen in last 6 months? 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   PLOF:  Independent  PATIENT GOALS:  Be able to move it.    OBJECTIVE:  Note: Objective measures were completed at Evaluation unless otherwise noted.  PATIENT SURVEYS:  FOTO 13  COGNITIVE STATUS: Within functional limits for tasks assessed   SENSATION: WFL  EDEMA:  No  POSTURE:  Eval: forward rounded shoulder as expected in sling  HAND DOMINANCE:  Right     Body Part #1 Shoulder  PALPATION: Eval: very guarded motion through shoulder  UPPER EXTREMITY ROM:  Passive ROM Right/Lt Eval 10/18   Shoulder flexion 20   Shoulder extension    Shoulder abduction 0   Shoulder adduction    Shoulder extension    Shoulder internal rotation    Shoulder external rotation -10   Elbow flexion full   Elbow extension -20    (Blank rows = not tested)      TREATMENT:                                                                                                                               10/28 Manual: gentle PROM into flexion and ER; trigger point  release to upper trap; gentle inferior and posterior glides   Pendulums x15 fwd and side to side   Elbow flexion x20   Sap retraction x20     10/23 Manual: gentle PROM into flexion and ER; trigger point release to upper trap; gentle inferior and posterior glides    DATE: eval 07/10/23 Prom to shoulder Changed bandages- no s/s of infection See HEP     PATIENT EDUCATION:  Education details: Anatomy of condition, POC, HEP, exercise form/rationale Person educated: Patient Education method: Explanation, Demonstration, Tactile cues, Verbal cues, and Handouts Education comprehension: verbalized understanding, returned demonstration, verbal cues required, tactile cues required, and needs further education  HOME EXERCISE PROGRAM: [id number]   ASSESSMENT:  CLINICAL IMPRESSION: Improved guarding today but still guarded. Patients ROM opened up well towards the end of the sessio> she has less pain with cuing to relax. We reviewed her HEP with her including scap retractions We will continue to progress as tolerated.   Eval:Patient is a 52 y.o. F who was seen today for physical therapy evaluation and treatment for Lt RCR. Very minimal tolerance to passive motion and very guarded. Asked that she remove her sling when at rest at home with a pillow propped under her upper arm.    REHAB POTENTIAL: Good  CLINICAL DECISION MAKING: Stable/uncomplicated  EVALUATION COMPLEXITY: Low   GOALS: Goals reviewed with patient? Yes  SHORT TERM GOALS: Target date: 4 wks (08/04/23)  Passive flexion  to 140 deg without increase in pain Baseline: Goal status: INITIAL  2.  Passive abd to 60 deg without increase in pain Baseline:  Goal status: INITIAL  3.  Passive ER at side to 40 deg without increase in pain Baseline:  Goal status: INITIAL  4.  Able to demo proper scapular retraction for proximal shoulder girdle support Baseline:  Goal status: INITIAL   LONG TERM GOALS: Target date: POC  DATE  Able to demo AROM to shoulder height, against gravity, with good scapular control Baseline:  Goal status: INITIAL  Target date:09/04/23 8 weeks  2.  Will begin light resistance exercises pain <=3/10 Baseline:  Goal status: INITIAL Target date:  8 weeks  3.  Full AROM within 10 deg of opp UE without increase in pain Baseline:  Goal status: INITIAL Target date: 10/02/23 12 weeks  4.  Proper scapular control in proprioceptive and CKC strengthening Baseline:  Goal status: INITIAL Target date: 10/03/23  12 weeks  5.  Able to lift cups/plates and other small objects into overhead cabinets without limitation by shoulder Baseline:  Goal status: INITIAL Target date: 10/03/23  12 weeks    PLAN:  PT FREQUENCY: 1-2x/week  PT DURATION: 12 weeks  PLANNED INTERVENTIONS: 97164- PT Re-evaluation, 97110-Therapeutic exercises, 97530- Therapeutic activity, 97112- Neuromuscular re-education, 97535- Self Care, 16109- Manual therapy, [id number]- Aquatic Therapy, 97014- Electrical stimulation (unattended), [phone]- Traction (mechanical), Patient/Family education, Taping, Dry Needling, Joint mobilization, Spinal mobilization, Scar mobilization, Cryotherapy, and Moist heat.  PLAN FOR NEXT SESSION: continue PROM per protocol  Lorayne Bender PT DPT 07/20/23 11:14 AM

## 2023-07-20 NOTE — Progress Notes (Signed)
Post Operative Evaluation    Procedure/Date of Surgery: Left shoulder rotator cuff repair with collagen patch augmentation 10/15  Interval History:   2 weeks status post left shoulder rotator cuff repair doing extremely well.  She is working with physical therapy.  She has having a very difficult time laying flat.   PMH/PSH/Family History/Social History/Meds/Allergies:    Past Medical History:  Diagnosis Date   Anemia 12/09/2018   Chronic low back pain    Chronic neck pain    Diabetes type 2, uncontrolled    diagnosed in 2019   Hyperlipidemia 08/01/2021   Hypertension complicating diabetes (HCC)    Neck pain 07/11/2021   Obesity 12/09/2018   OSA (obstructive sleep apnea)    sleep study done in [place] in 2019 per pt   Rash 01/28/2022   Screening for colon cancer 08/01/2021   Past Surgical History:  Procedure Laterality Date   COLONOSCOPY     COLONOSCOPY N/A 09/03/2021   Procedure: COLONOSCOPY;  Surgeon: Midge Minium, MD;  Location: [facility] ENDOSCOPY;  Service: Endoscopy;  Laterality: N/A;   TUBAL LIGATION     Social History   Socioeconomic History   Marital status: Legally Separated    Spouse name: Not on file   Number of children: Not on file   Years of education: Not on file   Highest education level: Associate degree: occupational, Scientist, product/process development, or vocational program  Occupational History   Not on file  Tobacco Use   Smoking status: Never   Smokeless tobacco: Never  Vaping Use   Vaping status: Never Used  Substance and Sexual Activity   Alcohol use: Never   Drug use: Never   Sexual activity: Not Currently    Partners: Male    Birth control/protection: Surgical, Post-menopausal    Comment: BTL, First IC <16 y/o, >5 Partners, Hx of RPR, GC  Other Topics Concern   Not on file  Social History Narrative   Not on file   Social Determinants of Health   Financial Resource Strain: Medium Risk (12/21/2022)   Overall Financial  Resource Strain (CARDIA)    Difficulty of Paying Living Expenses: Somewhat hard  Food Insecurity: Food Insecurity Present (12/21/2022)   Hunger Vital Sign    Worried About Running Out of Food in the Last Year: Sometimes true    Ran Out of Food in the Last Year: Sometimes true  Transportation Needs: No Transportation Needs (12/21/2022)   PRAPARE - Administrator, Civil Service (Medical): No    Lack of Transportation (Non-Medical): No  Physical Activity: Inactive (12/21/2022)   Exercise Vital Sign    Days of Exercise per Week: 2 days    Minutes of Exercise per Session: 0 min  Stress: No Stress Concern Present (12/21/2022)   [employer] of Occupational Health - Occupational Stress Questionnaire    Feeling of Stress : Only a little  Social Connections: Socially Isolated (12/21/2022)   Social Connection and Isolation Panel [NHANES]    Frequency of Communication with Friends and Family: More than three times a week    Frequency of Social Gatherings with Friends and Family: Not on file    Attends Religious Services: Never    Active Member of Clubs or Organizations: No    Attends Banker Meetings: Not on file    Marital  Status: Separated   Family History  Problem Relation Age of Onset   Varicose Veins Father    Hypertension Sister    Diabetes Sister    Diabetes Sister    Heart murmur Brother    Cancer Maternal Grandmother    Allergies  Allergen Reactions   Nsaids Nausea Only    nausea   Current Outpatient Medications  Medication Sig Dispense Refill   amLODipine (NORVASC) 5 MG tablet Take 1 tablet by mouth once daily 90 tablet 1   aspirin EC 325 MG tablet Take 1 tablet (325 mg total) by mouth daily. 30 tablet 0   atorvastatin (LIPITOR) 20 MG tablet Take 1 tablet by mouth once daily 90 tablet 1   Blood Glucose Monitoring Suppl (BLOOD GLUCOSE MONITOR SYSTEM) w/Device KIT 1 each by Does not apply route in the morning, at noon, and at bedtime. 1 kit 0    DULoxetine (CYMBALTA) 30 MG capsule Take 1 capsule (30 mg total) by mouth daily. 90 capsule 1   glipiZIDE (GLUCOTROL) 5 MG tablet Take 1 tablet (5 mg total) by mouth 2 (two) times daily before a meal. 180 tablet 1   Glucose Blood (BLOOD GLUCOSE TEST STRIPS) STRP For monitoring blood sugar levels up to 4 times a day. May substitute to any manufacturer covered by patient's insurance. 200 strip 99   HYDROcodone-acetaminophen (NORCO/VICODIN) 5-325 MG tablet Take 1 tablet by mouth every 6 (six) hours as needed for moderate pain (pain score 4-6). 20 tablet 0   insulin glargine-yfgn (SEMGLEE, YFGN,) 100 UNIT/ML Pen Inject 10 Units into the skin at bedtime. Take 10 UNITS nightly, may go up by 3 UNITS every 3 days if morning sugars are greater than 130 in increments of (13 UNITS, 16 UNITS, 19 UNITS, 22 UNITS, 25 UNITS every 3 days based on morning sugars) do not go higher than 25 UNITS. 15 mL 2   Insulin Pen Needle (UNIFINE PENTIPS) 31G X 6 MM MISC Use daily with insulin 100 each 1   Lancet Device MISC For monitoring blood sugar up to 4 times a day. May substitute to any manufacturer covered by patient's insurance. 200 each 99   Lancets (FREESTYLE) lancets Test 4x daily 200 each 11   Semaglutide, 1 MG/DOSE, 4 MG/3ML SOPN Inject 1 mg into the skin once a week. (Patient not taking: Reported on 07/08/2023) 3 mL 0   No current facility-administered medications for this visit.   No results found.  Review of Systems:   A ROS was performed including pertinent positives and negatives as documented in the HPI.   Musculoskeletal Exam:     Left shoulder is well-appearing incisions are without erythema or drainage.  Range of motion is from 0 to 50 degrees in the sitting position passively.  External rotation at the side is to 20 degrees.  Internal rotation deferred today.  Distal neurosensory exam is intact  Imaging:      I personally reviewed and interpreted the radiographs.   Assessment:   2 weeks status  post left shoulder rotator cuff repair overall doing well.  I would like her to continue to work aggressively on passive range of motion as I do believe that there may be a propensity for stiffness.  I will plan to see her back in 4 weeks for reassessment  Plan :    -Return to clinic 4 weeks for reassessment      I personally saw and evaluated the patient, and participated in the management and treatment plan.  Huel Cote, MD Attending Physician, Orthopedic Surgery  This document was dictated using Dragon voice recognition software. A reasonable attempt at proof reading has been made to minimize errors.

## 2023-07-21 ENCOUNTER — Encounter (HOSPITAL_BASED_OUTPATIENT_CLINIC_OR_DEPARTMENT_OTHER): Payer: Self-pay | Admitting: Orthopaedic Surgery

## 2023-07-27 ENCOUNTER — Ambulatory Visit (HOSPITAL_BASED_OUTPATIENT_CLINIC_OR_DEPARTMENT_OTHER): Payer: 59 | Attending: Orthopaedic Surgery | Admitting: Physical Therapy

## 2023-07-27 DIAGNOSIS — M6281 Muscle weakness (generalized): Secondary | ICD-10-CM | POA: Insufficient documentation

## 2023-07-27 DIAGNOSIS — E119 Type 2 diabetes mellitus without complications: Secondary | ICD-10-CM | POA: Diagnosis not present

## 2023-07-27 DIAGNOSIS — M25612 Stiffness of left shoulder, not elsewhere classified: Secondary | ICD-10-CM | POA: Insufficient documentation

## 2023-07-27 DIAGNOSIS — G8929 Other chronic pain: Secondary | ICD-10-CM | POA: Insufficient documentation

## 2023-07-27 DIAGNOSIS — M25512 Pain in left shoulder: Secondary | ICD-10-CM | POA: Insufficient documentation

## 2023-07-27 NOTE — Progress Notes (Deleted)
LMP 03/09/2019    Subjective:    Patient ID: Cindy Carson, female    DOB: 1971/07/13, 52 y.o.   MRN: [id number]  HPI: Cindy Carson is a 52 y.o. female  No chief complaint on file.  DIABETES Patient states she has been checking blood sugars at home and they are running in the 240s.  She is under 300 most of the time.  She has forgotten to take her ozempic.  She hasn't been taking the Ozempic for a couple of weeks but plans to restart it.  She went to 30u of Semglee 1 week ago.  Denies blurred vision, headaches, polyuria and polydipsia.    Relevant past medical, surgical, family and social history reviewed and updated as indicated. Interim medical history since our last visit reviewed. Allergies and medications reviewed and updated.  Review of Systems  Endocrine: Negative for polydipsia and polyuria.    Per HPI unless specifically indicated above     Objective:    LMP 03/09/2019   Wt Readings from Last 3 Encounters:  07/07/23 183 lb 3.2 oz (83.1 kg)  06/22/23 184 lb (83.5 kg)  03/05/23 193 lb 3.2 oz (87.6 kg)    Physical Exam Vitals and nursing note reviewed.  HENT:     Head: Normocephalic.     Right Ear: Hearing normal.     Left Ear: Hearing normal.     Nose: Nose normal.  Eyes:     Pupils: Pupils are equal, round, and reactive to light.  Pulmonary:     Effort: Pulmonary effort is normal. No respiratory distress.  Neurological:     Mental Status: She is alert.  Psychiatric:        Mood and Affect: Mood normal.        Behavior: Behavior normal.        Thought Content: Thought content normal.        Judgment: Judgment normal.    Results for orders placed or performed during the hospital encounter of 07/07/23  Glucose, capillary  Result Value Ref Range   Glucose-Capillary 332 (H) 70 - 99 mg/dL  Glucose, capillary  Result Value Ref Range   Glucose-Capillary 318 (H) 70 - 99 mg/dL  Glucose, capillary  Result Value Ref Range   Glucose-Capillary 279 (H) 70 - 99 mg/dL   Glucose, capillary  Result Value Ref Range   Glucose-Capillary 237 (H) 70 - 99 mg/dL   Comment 1 Notify RN    Comment 2 Document in Chart   Glucose, capillary  Result Value Ref Range   Glucose-Capillary 230 (H) 70 - 99 mg/dL  Glucose, capillary  Result Value Ref Range   Glucose-Capillary 208 (H) 70 - 99 mg/dL      Assessment & Plan:   Problem List Items Addressed This Visit   None      Follow up plan: No follow-ups on file.  This visit was completed via MyChart due to the restrictions of the COVID-19 pandemic. All issues as above were discussed and addressed. Physical exam was done as above through visual confirmation on MyChart. If it was felt that the patient should be evaluated in the office, they were directed there. The patient verbally consented to this visit. Location of the patient: Home Location of the provider: Office Those involved with this call:  Provider: Larae Grooms, NP CMA: Wilhemena Durie, CMA Front Desk/Registration: Servando Snare This encounter was conducted via video.  I spent 30 dedicated to the care of this patient on the date of  this encounter to include previsit review of symptoms, plan of care and follow up, face to face time with the patient, and post visit ordering of testing.

## 2023-07-27 NOTE — Therapy (Signed)
OUTPATIENT PHYSICAL THERAPY EVALUATION   Patient Name: Cindy Carson MRN: [id number] DOB:[birth year], 52 y.o., female Today's Date: 07/27/2023  END OF SESSION:    Past Medical History:  Diagnosis Date   Anemia 12/09/2018   Chronic low back pain    Chronic neck pain    Diabetes type 2, uncontrolled    diagnosed in 2019   Hyperlipidemia 08/01/2021   Hypertension complicating diabetes (HCC)    Neck pain 07/11/2021   Obesity 12/09/2018   OSA (obstructive sleep apnea)    sleep study done in [place] in 2019 per pt   Rash 01/28/2022   Screening for colon cancer 08/01/2021   Past Surgical History:  Procedure Laterality Date   COLONOSCOPY     COLONOSCOPY N/A 09/03/2021   Procedure: COLONOSCOPY;  Surgeon: Midge Minium, MD;  Location: [facility] ENDOSCOPY;  Service: Endoscopy;  Laterality: N/A;   SHOULDER ARTHROSCOPY WITH ROTATOR CUFF REPAIR Left 07/07/2023   Procedure: LEFT SHOULDER ARTHROSCOPY / DEBRIDEMENT WITH ROTATOR CUFF REPAIR;  Surgeon: Huel Cote, MD;  Location: [facility];  Service: Orthopedics;  Laterality: Left;   TUBAL LIGATION     Patient Active Problem List   Diagnosis Date Noted   Nontraumatic complete tear of left rotator cuff 07/07/2023   Type 2 diabetes mellitus with hyperglycemia, without long-term current use of insulin (HCC) 07/25/2022   Depression, recurrent (HCC) 07/25/2022   Encounter for screening for malignant neoplasm of breast 07/11/2021   Hyperlipidemia associated with type 2 diabetes mellitus (HCC) 07/11/2021   Personal history of noncompliance with medical treatment, presenting hazards to health 02/15/2019   History of syphilis 12/09/2018   Bursitis of hip 12/09/2018   Anemia 12/09/2018   Obesity 12/09/2018   OSA (obstructive sleep apnea)    HTN (hypertension)    Hypertension complicating diabetes (HCC)    Chronic low back pain    Chronic neck pain      REFERRING PROVIDER:  Huel Cote, MD    REFERRING DIAG: [phone]  (ICD-10-CM) - Nontraumatic complete tear of left rotator cuff  S/p Lt RCR  Rationale for Evaluation and Treatment: Rehabilitation  THERAPY DIAG:  No diagnosis found.  ONSET DATE: DOS 07/07/23   Days since surgery: 20  SUBJECTIVE:                                                                                                                                                                                           SUBJECTIVE STATEMENT: The patient was sore last night. She feels like she has some difficulty positioning at night. She has been compliant with the sling.    PERTINENT HISTORY:  DMT2, anemia,  chronic neck pain  PAIN:  Are you having pain? Yes: NPRS scale: 5/10 Pain location: Lt shoulder Pain description: ache, stab Aggravating factors: constant since surgery Relieving factors: meds  PRECAUTIONS:  Shoulder  RED FLAGS: None   WEIGHT BEARING RESTRICTIONS:  Yes POSTOPERATIVE PLAN: She will follow the rotator cuff repair protocol.  She will be placed on aspirin for blood clot prevention.  She will be seen by physical therapy postop.  She will be seen at 2 weeks postop for suture removal  FALLS:  Has patient fallen in last 6 months? 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   PLOF:  Independent  PATIENT GOALS:  Be able to move it.    OBJECTIVE:  Note: Objective measures were completed at Evaluation unless otherwise noted.  PATIENT SURVEYS:  FOTO 13  COGNITIVE STATUS: Within functional limits for tasks assessed   SENSATION: WFL  EDEMA:  No  POSTURE:  Eval: forward rounded shoulder as expected in sling  HAND DOMINANCE:  Right     Body Part #1 Shoulder  PALPATION: Eval: very guarded motion through shoulder  UPPER EXTREMITY ROM:  Passive ROM Right/Lt Eval 10/18   Shoulder flexion 20   Shoulder extension    Shoulder abduction 0   Shoulder adduction    Shoulder  extension    Shoulder internal rotation    Shoulder external rotation -10   Elbow flexion full   Elbow extension -20    (Blank rows = not tested)      TREATMENT:                                                                                                                               11/4  Manual: gentle PROM into flexion and ER; trigger point release to upper trap; gentle inferior and posterior glides   Reviewed HEP   10/28 Manual: gentle PROM into flexion and ER; trigger point release to upper trap; gentle inferior and posterior glides   Pendulums x15 fwd and side to side   Elbow flexion x20   Sap retraction x20     10/23 Manual: gentle PROM into flexion and ER; trigger point release to upper trap; gentle inferior and posterior glides    DATE: eval 07/10/23 Prom to shoulder Changed bandages- no s/s of infection See HEP     PATIENT EDUCATION:  Education details: Anatomy of condition, POC, HEP, exercise form/rationale Person educated: Patient Education method: Explanation, Demonstration, Tactile cues, Verbal cues, and Handouts Education comprehension: verbalized understanding, returned demonstration, verbal cues required, tactile cues required, and needs further education  HOME EXERCISE PROGRAM: [email or web address]   ASSESSMENT:  CLINICAL IMPRESSION: The patient continues to have limitations in flexion and ER although they improve with manual therapy. We may add in a table slide at the 4 week mark.  She is encouraged to try some pendulums at night before she goes  to bed see if she can work out some stiffness before going to bed.  Therapy will continue to progress per rotator cuff protocol  Eval:Patient is a 52 y.o. F who was seen today for physical therapy evaluation and treatment for Lt RCR. Very minimal tolerance to passive motion and very guarded. Asked that she remove her sling when at rest at home with a pillow propped under her upper arm.    REHAB POTENTIAL:  Good  CLINICAL DECISION MAKING: Stable/uncomplicated  EVALUATION COMPLEXITY: Low   GOALS: Goals reviewed with patient? Yes  SHORT TERM GOALS: Target date: 4 wks (08/04/23)  Passive flexion to 140 deg without increase in pain Baseline: Goal status: INITIAL  2.  Passive abd to 60 deg without increase in pain Baseline:  Goal status: INITIAL  3.  Passive ER at side to 40 deg without increase in pain Baseline:  Goal status: INITIAL  4.  Able to demo proper scapular retraction for proximal shoulder girdle support Baseline:  Goal status: INITIAL   LONG TERM GOALS: Target date: POC DATE  Able to demo AROM to shoulder height, against gravity, with good scapular control Baseline:  Goal status: INITIAL  Target date:09/04/23 8 weeks  2.  Will begin light resistance exercises pain <=3/10 Baseline:  Goal status: INITIAL Target date:  8 weeks  3.  Full AROM within 10 deg of opp UE without increase in pain Baseline:  Goal status: INITIAL Target date: 10/02/23 12 weeks  4.  Proper scapular control in proprioceptive and CKC strengthening Baseline:  Goal status: INITIAL Target date: 10/03/23  12 weeks  5.  Able to lift cups/plates and other small objects into overhead cabinets without limitation by shoulder Baseline:  Goal status: INITIAL Target date: 10/03/23  12 weeks    PLAN:  PT FREQUENCY: 1-2x/week  PT DURATION: 12 weeks  PLANNED INTERVENTIONS: 97164- PT Re-evaluation, 97110-Therapeutic exercises, 97530- Therapeutic activity, 97112- Neuromuscular re-education, 97535- Self Care, 40347- Manual therapy, [id number]- Aquatic Therapy, 97014- Electrical stimulation (unattended), [phone]- Traction (mechanical), Patient/Family education, Taping, Dry Needling, Joint mobilization, Spinal mobilization, Scar mobilization, Cryotherapy, and Moist heat.  PLAN FOR NEXT SESSION: continue PROM per protocol  Lorayne Bender PT DPT 07/27/23 10:09 AM

## 2023-07-28 ENCOUNTER — Ambulatory Visit: Payer: 59 | Admitting: Nurse Practitioner

## 2023-07-28 ENCOUNTER — Other Ambulatory Visit (HOSPITAL_BASED_OUTPATIENT_CLINIC_OR_DEPARTMENT_OTHER): Payer: Self-pay | Admitting: Orthopaedic Surgery

## 2023-07-29 ENCOUNTER — Other Ambulatory Visit: Payer: Self-pay

## 2023-08-03 ENCOUNTER — Ambulatory Visit (HOSPITAL_BASED_OUTPATIENT_CLINIC_OR_DEPARTMENT_OTHER): Payer: 59 | Admitting: Physical Therapy

## 2023-08-03 ENCOUNTER — Encounter (HOSPITAL_BASED_OUTPATIENT_CLINIC_OR_DEPARTMENT_OTHER): Payer: Self-pay | Admitting: Physical Therapy

## 2023-08-03 ENCOUNTER — Other Ambulatory Visit (HOSPITAL_BASED_OUTPATIENT_CLINIC_OR_DEPARTMENT_OTHER): Payer: Self-pay

## 2023-08-03 DIAGNOSIS — M6281 Muscle weakness (generalized): Secondary | ICD-10-CM

## 2023-08-03 DIAGNOSIS — M25612 Stiffness of left shoulder, not elsewhere classified: Secondary | ICD-10-CM | POA: Diagnosis not present

## 2023-08-03 DIAGNOSIS — M25512 Pain in left shoulder: Secondary | ICD-10-CM

## 2023-08-03 DIAGNOSIS — G8929 Other chronic pain: Secondary | ICD-10-CM

## 2023-08-03 MED ORDER — INFLUENZA VIRUS VACC SPLIT PF (FLUZONE) 0.5 ML IM SUSY
0.5000 mL | PREFILLED_SYRINGE | Freq: Once | INTRAMUSCULAR | 0 refills | Status: AC
  Filled 2023-08-03: qty 0.5, 1d supply, fill #0

## 2023-08-03 NOTE — Therapy (Signed)
OUTPATIENT PHYSICAL THERAPY EVALUATION   Patient Name: Cindy Carson MRN: [id number] DOB:30-Mar-1971, 52 y.o., female Today's Date: 08/03/2023  END OF SESSION:  PT End of Session - 08/03/23 1045     Visit Number 5    Number of Visits 25    Date for PT Re-Evaluation 10/03/23    Authorization Type UMR    Authorization Time Period 08/25/22 to 10/20/22    PT Start Time 0930    PT Stop Time 1013    PT Time Calculation (min) 43 min    Activity Tolerance Patient tolerated treatment well    Behavior During Therapy [facility] for tasks assessed/performed              Past Medical History:  Diagnosis Date   Anemia 12/09/2018   Chronic low back pain    Chronic neck pain    Diabetes type 2, uncontrolled    diagnosed in 2019   Hyperlipidemia 08/01/2021   Hypertension complicating diabetes (HCC)    Neck pain 07/11/2021   Obesity 12/09/2018   OSA (obstructive sleep apnea)    sleep study done in [place] in 2019 per pt   Rash 01/28/2022   Screening for colon cancer 08/01/2021   Past Surgical History:  Procedure Laterality Date   COLONOSCOPY     COLONOSCOPY N/A 09/03/2021   Procedure: COLONOSCOPY;  Surgeon: Midge Minium, MD;  Location: [facility] ENDOSCOPY;  Service: Endoscopy;  Laterality: N/A;   SHOULDER ARTHROSCOPY WITH ROTATOR CUFF REPAIR Left 07/07/2023   Procedure: LEFT SHOULDER ARTHROSCOPY / DEBRIDEMENT WITH ROTATOR CUFF REPAIR;  Surgeon: Huel Cote, MD;  Location: [facility];  Service: Orthopedics;  Laterality: Left;   TUBAL LIGATION     Patient Active Problem List   Diagnosis Date Noted   Nontraumatic complete tear of left rotator cuff 07/07/2023   Type 2 diabetes mellitus with hyperglycemia, without long-term current use of insulin (HCC) 07/25/2022   Depression, recurrent (HCC) 07/25/2022   Encounter for screening for malignant neoplasm of breast 07/11/2021   Hyperlipidemia associated with type 2 diabetes mellitus (HCC) 07/11/2021   Personal history of  noncompliance with medical treatment, presenting hazards to health 02/15/2019   History of syphilis 12/09/2018   Bursitis of hip 12/09/2018   Anemia 12/09/2018   Obesity 12/09/2018   OSA (obstructive sleep apnea)    HTN (hypertension)    Hypertension complicating diabetes (HCC)    Chronic low back pain    Chronic neck pain      REFERRING PROVIDER:  Huel Cote, MD    REFERRING DIAG: [phone] (ICD-10-CM) - Nontraumatic complete tear of left rotator cuff  S/p Lt RCR  Rationale for Evaluation and Treatment: Rehabilitation  THERAPY DIAG:  Muscle weakness (generalized)  Acute pain of left shoulder  Stiffness of left shoulder, not elsewhere classified  Chronic left shoulder pain  ONSET DATE: DOS 07/07/23   Days since surgery: 27  SUBJECTIVE:  SUBJECTIVE STATEMENT: The patient continues to have mild pain   PERTINENT HISTORY:  DMT2, anemia, chronic neck pain  PAIN:  Are you having pain? Yes: NPRS scale: 5/10 Pain location: Lt shoulder Pain description: ache, stab Aggravating factors: constant since surgery Relieving factors: meds  PRECAUTIONS:  Shoulder  RED FLAGS: None   WEIGHT BEARING RESTRICTIONS:  Yes POSTOPERATIVE PLAN: She will follow the rotator cuff repair protocol.  She will be placed on aspirin for blood clot prevention.  She will be seen by physical therapy postop.  She will be seen at 2 weeks postop for suture removal  FALLS:  Has patient fallen in last 6 months? 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   PLOF:  Independent  PATIENT GOALS:  Be able to move it.    OBJECTIVE:  Note: Objective measures were completed at Evaluation unless otherwise noted.  PATIENT SURVEYS:  FOTO  13  COGNITIVE STATUS: Within functional limits for tasks assessed   SENSATION: WFL  EDEMA:  No  POSTURE:  Eval: forward rounded shoulder as expected in sling  HAND DOMINANCE:  Right     Body Part #1 Shoulder  PALPATION: Eval: very guarded motion through shoulder  UPPER EXTREMITY ROM:  Passive ROM Right/Lt Eval 10/18   Shoulder flexion 20   Shoulder extension    Shoulder abduction 0   Shoulder adduction    Shoulder extension    Shoulder internal rotation    Shoulder external rotation -10   Elbow flexion full   Elbow extension -20    (Blank rows = not tested)      TREATMENT:                                                                                                                              11/4  Manual: gentle PROM into flexion and ER; trigger point release to upper trap; gentle inferior and posterior glides   Table slide 2x10 with mod cuing for technique   Scap retraction 2x10   Hand weight supination/ pronation 2lbs  Wrist flexion/extension 2x10     11/4  Manual: gentle PROM into flexion and ER; trigger point release to upper trap; gentle inferior and posterior glides   Reviewed HEP   10/28 Manual: gentle PROM into flexion and ER; trigger point release to upper trap; gentle inferior and posterior glides   Pendulums x15 fwd and side to side   Elbow flexion x20   Sap retraction x20     10/23 Manual: gentle PROM into flexion and ER; trigger point release to upper trap; gentle inferior and posterior glides    DATE: eval 07/10/23 Prom to shoulder Changed bandages- no s/s of infection See HEP     PATIENT EDUCATION:  Education details: Anatomy of condition, POC, HEP, exercise form/rationale Person educated: Patient Education method: Explanation, Demonstration, Tactile cues, Verbal cues, and Handouts Education comprehension: verbalized  understanding, returned demonstration, verbal cues required, tactile cues required, and needs  further education  HOME EXERCISE PROGRAM: [email or web address]   ASSESSMENT:  CLINICAL IMPRESSION: The patients motion continues to improve. She had about 18 degrees of ER today and 90 degrees of flexion. She is still somewhat guarded but improving. She was given a table slide for her HEP today. She tolerated well. Therapy will continue to progress per protocol.    Eval:Patient is a 52 y.o. F who was seen today for physical therapy evaluation and treatment for Lt RCR. Very minimal tolerance to passive motion and very guarded. Asked that she remove her sling when at rest at home with a pillow propped under her upper arm.    REHAB POTENTIAL: Good  CLINICAL DECISION MAKING: Stable/uncomplicated  EVALUATION COMPLEXITY: Low   GOALS: Goals reviewed with patient? Yes  SHORT TERM GOALS: Target date: 4 wks (08/04/23)  Passive flexion to 140 deg without increase in pain Baseline: Goal status:90 degrees 11/11  2.  Passive abd to 60 deg without increase in pain Baseline:  Goal status: 60 achieved 11/11  3.  Passive ER at side to 40 deg without increase in pain Baseline:  Goal status 18 11/11   4.  Able to demo proper scapular retraction for proximal shoulder girdle support Baseline:  Goal status: INITIAL   LONG TERM GOALS: Target date: POC DATE  Able to demo AROM to shoulder height, against gravity, with good scapular control Baseline:  Goal status: INITIAL  Target date:09/04/23 8 weeks  2.  Will begin light resistance exercises pain <=3/10 Baseline:  Goal status: INITIAL Target date:  8 weeks  3.  Full AROM within 10 deg of opp UE without increase in pain Baseline:  Goal status: INITIAL Target date: 10/02/23 12 weeks  4.  Proper scapular control in proprioceptive and CKC strengthening Baseline:  Goal status: INITIAL Target date: 10/03/23  12 weeks  5.  Able to lift cups/plates and other small objects into overhead cabinets without limitation by shoulder Baseline:  Goal  status: INITIAL Target date: 10/03/23  12 weeks    PLAN:  PT FREQUENCY: 1-2x/week  PT DURATION: 12 weeks  PLANNED INTERVENTIONS: 97164- PT Re-evaluation, 97110-Therapeutic exercises, 97530- Therapeutic activity, 97112- Neuromuscular re-education, 97535- Self Care, 34742- Manual therapy, [id number]- Aquatic Therapy, 97014- Electrical stimulation (unattended), [phone]- Traction (mechanical), Patient/Family education, Taping, Dry Needling, Joint mobilization, Spinal mobilization, Scar mobilization, Cryotherapy, and Moist heat.  PLAN FOR NEXT SESSION: continue PROM per protocol  Lorayne Bender PT DPT 08/03/23 10:48 AM

## 2023-08-05 ENCOUNTER — Telehealth (HOSPITAL_BASED_OUTPATIENT_CLINIC_OR_DEPARTMENT_OTHER): Payer: Self-pay | Admitting: Orthopaedic Surgery

## 2023-08-05 ENCOUNTER — Other Ambulatory Visit (HOSPITAL_BASED_OUTPATIENT_CLINIC_OR_DEPARTMENT_OTHER): Payer: Self-pay

## 2023-08-05 ENCOUNTER — Encounter (HOSPITAL_BASED_OUTPATIENT_CLINIC_OR_DEPARTMENT_OTHER): Payer: Self-pay

## 2023-08-05 ENCOUNTER — Other Ambulatory Visit (HOSPITAL_BASED_OUTPATIENT_CLINIC_OR_DEPARTMENT_OTHER): Payer: Self-pay | Admitting: Orthopaedic Surgery

## 2023-08-05 ENCOUNTER — Ambulatory Visit (HOSPITAL_BASED_OUTPATIENT_CLINIC_OR_DEPARTMENT_OTHER): Payer: 59

## 2023-08-05 ENCOUNTER — Other Ambulatory Visit: Payer: Self-pay

## 2023-08-05 DIAGNOSIS — M6281 Muscle weakness (generalized): Secondary | ICD-10-CM | POA: Diagnosis not present

## 2023-08-05 DIAGNOSIS — M25512 Pain in left shoulder: Secondary | ICD-10-CM | POA: Diagnosis not present

## 2023-08-05 DIAGNOSIS — G8929 Other chronic pain: Secondary | ICD-10-CM | POA: Diagnosis not present

## 2023-08-05 DIAGNOSIS — M25612 Stiffness of left shoulder, not elsewhere classified: Secondary | ICD-10-CM

## 2023-08-05 MED ORDER — HYDROCODONE-ACETAMINOPHEN 5-325 MG PO TABS
1.0000 | ORAL_TABLET | Freq: Four times a day (QID) | ORAL | 0 refills | Status: DC | PRN
  Filled 2023-08-05: qty 20, 5d supply, fill #0

## 2023-08-05 NOTE — Therapy (Signed)
OUTPATIENT PHYSICAL THERAPY TREATMENT   Patient Name: Cindy Carson MRN: [id number] DOB:07-01-1971, 52 y.o., female Today's Date: 08/05/2023  END OF SESSION:  PT End of Session - 08/05/23 1015     Visit Number 6    Number of Visits 25    Date for PT Re-Evaluation 10/03/23    Authorization Type UMR    Authorization Time Period 08/25/22 to 10/20/22    PT Start Time 0935    PT Stop Time 1013    PT Time Calculation (min) 38 min    Activity Tolerance Patient tolerated treatment well    Behavior During Therapy [facility] for tasks assessed/performed               Past Medical History:  Diagnosis Date   Anemia 12/09/2018   Chronic low back pain    Chronic neck pain    Diabetes type 2, uncontrolled    diagnosed in 2019   Hyperlipidemia 08/01/2021   Hypertension complicating diabetes (HCC)    Neck pain 07/11/2021   Obesity 12/09/2018   OSA (obstructive sleep apnea)    sleep study done in [place] in 2019 per pt   Rash 01/28/2022   Screening for colon cancer 08/01/2021   Past Surgical History:  Procedure Laterality Date   COLONOSCOPY     COLONOSCOPY N/A 09/03/2021   Procedure: COLONOSCOPY;  Surgeon: Midge Minium, MD;  Location: [facility] ENDOSCOPY;  Service: Endoscopy;  Laterality: N/A;   SHOULDER ARTHROSCOPY WITH ROTATOR CUFF REPAIR Left 07/07/2023   Procedure: LEFT SHOULDER ARTHROSCOPY / DEBRIDEMENT WITH ROTATOR CUFF REPAIR;  Surgeon: Huel Cote, MD;  Location: [facility];  Service: Orthopedics;  Laterality: Left;   TUBAL LIGATION     Patient Active Problem List   Diagnosis Date Noted   Nontraumatic complete tear of left rotator cuff 07/07/2023   Type 2 diabetes mellitus with hyperglycemia, without long-term current use of insulin (HCC) 07/25/2022   Depression, recurrent (HCC) 07/25/2022   Encounter for screening for malignant neoplasm of breast 07/11/2021   Hyperlipidemia associated with type 2 diabetes mellitus (HCC) 07/11/2021   Personal history of  noncompliance with medical treatment, presenting hazards to health 02/15/2019   History of syphilis 12/09/2018   Bursitis of hip 12/09/2018   Anemia 12/09/2018   Obesity 12/09/2018   OSA (obstructive sleep apnea)    HTN (hypertension)    Hypertension complicating diabetes (HCC)    Chronic low back pain    Chronic neck pain      REFERRING PROVIDER:  Huel Cote, MD    REFERRING DIAG: [phone] (ICD-10-CM) - Nontraumatic complete tear of left rotator cuff  S/p Lt RCR  Rationale for Evaluation and Treatment: Rehabilitation  THERAPY DIAG:  Muscle weakness (generalized)  Acute pain of left shoulder  Stiffness of left shoulder, not elsewhere classified  ONSET DATE: DOS 07/07/23   Days since surgery: 29  SUBJECTIVE:  SUBJECTIVE STATEMENT: The patient continues to have mild pain.   PERTINENT HISTORY:  DMT2, anemia, chronic neck pain  PAIN:  Are you having pain? Yes: NPRS scale: 3-4/10 Pain location: Lt shoulder Pain description: ache, stab Aggravating factors: constant since surgery Relieving factors: meds  PRECAUTIONS:  Shoulder  RED FLAGS: None   WEIGHT BEARING RESTRICTIONS:  Yes POSTOPERATIVE PLAN: She will follow the rotator cuff repair protocol.  She will be placed on aspirin for blood clot prevention.  She will be seen by physical therapy postop.  She will be seen at 2 weeks postop for suture removal  FALLS:  Has patient fallen in last 6 months? 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   PLOF:  Independent  PATIENT GOALS:  Be able to move it.    OBJECTIVE:  Note: Objective measures were completed at Evaluation unless otherwise noted.  PATIENT SURVEYS:  FOTO 13  COGNITIVE STATUS: Within  functional limits for tasks assessed   SENSATION: WFL  EDEMA:  No  POSTURE:  Eval: forward rounded shoulder as expected in sling  HAND DOMINANCE:  Right     Body Part #1 Shoulder  PALPATION: Eval: very guarded motion through shoulder  UPPER EXTREMITY ROM:  Passive ROM Right/Lt Eval 10/18   Shoulder flexion 20   Shoulder extension    Shoulder abduction 0   Shoulder adduction    Shoulder extension    Shoulder internal rotation    Shoulder external rotation -10   Elbow flexion full   Elbow extension -20    (Blank rows = not tested)      TREATMENT:                                                                                                                               11/13  Manual: PROM R shoulder Supine well arm assist flexion x10  Ball rolls at plinth 2x10  Scap retraction 2x10    Pendulums  11/4  Manual: gentle PROM into flexion and ER; trigger point release to upper trap; gentle inferior and posterior glides   Table slide 2x10 with mod cuing for technique   Scap retraction 2x10   Hand weight supination/ pronation 2lbs  Wrist flexion/extension 2x10     11/4  Manual: gentle PROM into flexion and ER; trigger point release to upper trap; gentle inferior and posterior glides   Reviewed HEP   10/28 Manual: gentle PROM into flexion and ER; trigger point release to upper trap; gentle inferior and posterior glides   Pendulums x15 fwd and side to side   Elbow flexion x20   Sap retraction x20     10/23 Manual: gentle PROM into flexion and ER; trigger point release to upper trap; gentle inferior and posterior glides    DATE: eval 07/10/23 Prom to shoulder Changed bandages- no s/s of infection See HEP  PATIENT EDUCATION:  Education details: Teacher, music of condition, POC, HEP, exercise form/rationale Person educated: Patient Education method: Explanation, Demonstration, Tactile cues, Verbal cues, and Handouts Education  comprehension: verbalized understanding, returned demonstration, verbal cues required, tactile cues required, and needs further education  HOME EXERCISE PROGRAM: [email or web address]   ASSESSMENT:  CLINICAL IMPRESSION: Good tolerance for PROM, though she does require frequent cues for decreasing muscle guarding. She has improved motion with well arm assisted flexion in supine without c/o pain. Mild discomfort with standing flexion ball rolls. Reviewed precautions and restrictions with pt as well as healing timeline.    Eval:Patient is a 52 y.o. F who was seen today for physical therapy evaluation and treatment for Lt RCR. Very minimal tolerance to passive motion and very guarded. Asked that she remove her sling when at rest at home with a pillow propped under her upper arm.    REHAB POTENTIAL: Good  CLINICAL DECISION MAKING: Stable/uncomplicated  EVALUATION COMPLEXITY: Low   GOALS: Goals reviewed with patient? Yes  SHORT TERM GOALS: Target date: 4 wks (08/04/23)  Passive flexion to 140 deg without increase in pain Baseline: Goal status:90 degrees 11/11  2.  Passive abd to 60 deg without increase in pain Baseline:  Goal status: 60 achieved 11/11  3.  Passive ER at side to 40 deg without increase in pain Baseline:  Goal status 18 11/11   4.  Able to demo proper scapular retraction for proximal shoulder girdle support Baseline:  Goal status: INITIAL   LONG TERM GOALS: Target date: POC DATE  Able to demo AROM to shoulder height, against gravity, with good scapular control Baseline:  Goal status: INITIAL  Target date:09/04/23 8 weeks  2.  Will begin light resistance exercises pain <=3/10 Baseline:  Goal status: INITIAL Target date:  8 weeks  3.  Full AROM within 10 deg of opp UE without increase in pain Baseline:  Goal status: INITIAL Target date: 10/02/23 12 weeks  4.  Proper scapular control in proprioceptive and CKC strengthening Baseline:  Goal status: INITIAL Target  date: 10/03/23  12 weeks  5.  Able to lift cups/plates and other small objects into overhead cabinets without limitation by shoulder Baseline:  Goal status: INITIAL Target date: 10/03/23  12 weeks    PLAN:  PT FREQUENCY: 1-2x/week  PT DURATION: 12 weeks  PLANNED INTERVENTIONS: 97164- PT Re-evaluation, 97110-Therapeutic exercises, 97530- Therapeutic activity, 97112- Neuromuscular re-education, 97535- Self Care, 86578- Manual therapy, [id number]- Aquatic Therapy, 97014- Electrical stimulation (unattended), [phone]- Traction (mechanical), Patient/Family education, Taping, Dry Needling, Joint mobilization, Spinal mobilization, Scar mobilization, Cryotherapy, and Moist heat.  PLAN FOR NEXT SESSION: continue PROM per protocol  Riki Altes, PTA  08/05/23 10:21 AM

## 2023-08-05 NOTE — Telephone Encounter (Signed)
Priseis would  like Hydrocodone sent to pharmacy for her pain they have increase PT. Patient is at Pt now please send to pharmacy down stairs

## 2023-08-10 ENCOUNTER — Ambulatory Visit (HOSPITAL_BASED_OUTPATIENT_CLINIC_OR_DEPARTMENT_OTHER): Payer: 59 | Admitting: Physical Therapy

## 2023-08-10 DIAGNOSIS — M25512 Pain in left shoulder: Secondary | ICD-10-CM

## 2023-08-10 DIAGNOSIS — M6281 Muscle weakness (generalized): Secondary | ICD-10-CM | POA: Diagnosis not present

## 2023-08-10 DIAGNOSIS — G8929 Other chronic pain: Secondary | ICD-10-CM | POA: Diagnosis not present

## 2023-08-10 DIAGNOSIS — M25612 Stiffness of left shoulder, not elsewhere classified: Secondary | ICD-10-CM

## 2023-08-10 NOTE — Therapy (Signed)
OUTPATIENT PHYSICAL THERAPY TREATMENT   Patient Name: Cindy Carson MRN: [id number] DOB:1971-03-27, 52 y.o., female Today's Date: 08/10/2023  END OF SESSION:      Past Medical History:  Diagnosis Date   Anemia 12/09/2018   Chronic low back pain    Chronic neck pain    Diabetes type 2, uncontrolled    diagnosed in 2019   Hyperlipidemia 08/01/2021   Hypertension complicating diabetes (HCC)    Neck pain 07/11/2021   Obesity 12/09/2018   OSA (obstructive sleep apnea)    sleep study done in [place] in 2019 per pt   Rash 01/28/2022   Screening for colon cancer 08/01/2021   Past Surgical History:  Procedure Laterality Date   COLONOSCOPY     COLONOSCOPY N/A 09/03/2021   Procedure: COLONOSCOPY;  Surgeon: Midge Minium, MD;  Location: [facility] ENDOSCOPY;  Service: Endoscopy;  Laterality: N/A;   SHOULDER ARTHROSCOPY WITH ROTATOR CUFF REPAIR Left 07/07/2023   Procedure: LEFT SHOULDER ARTHROSCOPY / DEBRIDEMENT WITH ROTATOR CUFF REPAIR;  Surgeon: Huel Cote, MD;  Location: [facility];  Service: Orthopedics;  Laterality: Left;   TUBAL LIGATION     Patient Active Problem List   Diagnosis Date Noted   Nontraumatic complete tear of left rotator cuff 07/07/2023   Type 2 diabetes mellitus with hyperglycemia, without long-term current use of insulin (HCC) 07/25/2022   Depression, recurrent (HCC) 07/25/2022   Encounter for screening for malignant neoplasm of breast 07/11/2021   Hyperlipidemia associated with type 2 diabetes mellitus (HCC) 07/11/2021   Personal history of noncompliance with medical treatment, presenting hazards to health 02/15/2019   History of syphilis 12/09/2018   Bursitis of hip 12/09/2018   Anemia 12/09/2018   Obesity 12/09/2018   OSA (obstructive sleep apnea)    HTN (hypertension)    Hypertension complicating diabetes (HCC)    Chronic low back pain    Chronic neck pain      REFERRING PROVIDER:  Huel Cote, MD    REFERRING DIAG: [phone]  (ICD-10-CM) - Nontraumatic complete tear of left rotator cuff  S/p Lt RCR  Rationale for Evaluation and Treatment: Rehabilitation  THERAPY DIAG:  No diagnosis found.  ONSET DATE: DOS 07/07/23   Days since surgery: 34  SUBJECTIVE:                                                                                                                                                                                           SUBJECTIVE STATEMENT: The patient continues to have mild pain.   PERTINENT HISTORY:  DMT2, anemia, chronic neck pain  PAIN:  Are you having pain? Yes: NPRS scale: 3-4/10 Pain  location: Lt shoulder Pain description: ache, stab Aggravating factors: constant since surgery Relieving factors: meds  PRECAUTIONS:  Shoulder  RED FLAGS: None   WEIGHT BEARING RESTRICTIONS:  Yes POSTOPERATIVE PLAN: She will follow the rotator cuff repair protocol.  She will be placed on aspirin for blood clot prevention.  She will be seen by physical therapy postop.  She will be seen at 2 weeks postop for suture removal  FALLS:  Has patient fallen in last 6 months? 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   PLOF:  Independent  PATIENT GOALS:  Be able to move it.    OBJECTIVE:  Note: Objective measures were completed at Evaluation unless otherwise noted.  PATIENT SURVEYS:  FOTO 13  COGNITIVE STATUS: Within functional limits for tasks assessed   SENSATION: WFL  EDEMA:  No  POSTURE:  Eval: forward rounded shoulder as expected in sling  HAND DOMINANCE:  Right     Body Part #1 Shoulder  PALPATION: Eval: very guarded motion through shoulder  UPPER EXTREMITY ROM:  Passive ROM Right/Lt Eval 10/18   Shoulder flexion 20   Shoulder extension    Shoulder abduction 0   Shoulder adduction    Shoulder extension    Shoulder internal rotation    Shoulder external rotation -10   Elbow flexion  full   Elbow extension -20    (Blank rows = not tested)      TREATMENT:                                                                                                                              11/18 Manual: gentle PROM into flexion and ER; trigger point release to upper trap; gentle inferior and posterior glides    Table slide 2x10   Scap retraction 2x10   Wrist flexion 2x15 2lbs  Wrist extension 2lbs 2x15     11/13  Manual: PROM R shoulder Supine well arm assist flexion x10  Ball rolls at plinth 2x10  Scap retraction 2x10    Pendulums  11/4  Manual: gentle PROM into flexion and ER; trigger point release to upper trap; gentle inferior and posterior glides   Table slide 2x10 with mod cuing for technique   Scap retraction 2x10   Hand weight supination/ pronation 2lbs  Wrist flexion/extension 2x10     11/4  Manual: gentle PROM into flexion and ER; trigger point release to upper trap; gentle inferior and posterior glides   Reviewed HEP   10/28 Manual: gentle PROM into flexion and ER; trigger point release to upper trap; gentle inferior and posterior glides   Pendulums x15 fwd and side to side   Elbow flexion x20   Sap retraction x20     10/23 Manual: gentle PROM into flexion and ER; trigger point release to upper trap; gentle inferior and posterior glides    DATE: eval 07/10/23 Prom to  shoulder Changed bandages- no s/s of infection See HEP     PATIENT EDUCATION:  Education details: Anatomy of condition, POC, HEP, exercise form/rationale Person educated: Patient Education method: Explanation, Demonstration, Tactile cues, Verbal cues, and Handouts Education comprehension: verbalized understanding, returned demonstration, verbal cues required, tactile cues required, and needs further education  HOME EXERCISE PROGRAM: [id number]   ASSESSMENT:  CLINICAL IMPRESSION: The patient is progressing well but is having pain. She admits having a  low pain tolerance. She was advised to continue icing and using medications given by the MD to control the pain. We advanced her wrist exercises to a 2lb weight. She did well with shoulder table slides    Eval:Patient is a 52 y.o. F who was seen today for physical therapy evaluation and treatment for Lt RCR. Very minimal tolerance to passive motion and very guarded. Asked that she remove her sling when at rest at home with a pillow propped under her upper arm.    REHAB POTENTIAL: Good  CLINICAL DECISION MAKING: Stable/uncomplicated  EVALUATION COMPLEXITY: Low   GOALS: Goals reviewed with patient? Yes  SHORT TERM GOALS: Target date: 4 wks (08/04/23)  Passive flexion to 140 deg without increase in pain Baseline: Goal status:90 degrees 11/11  2.  Passive abd to 60 deg without increase in pain Baseline:  Goal status: 60 achieved 11/11  3.  Passive ER at side to 40 deg without increase in pain Baseline:  Goal status 18 11/11   4.  Able to demo proper scapular retraction for proximal shoulder girdle support Baseline:  Goal status: INITIAL   LONG TERM GOALS: Target date: POC DATE  Able to demo AROM to shoulder height, against gravity, with good scapular control Baseline:  Goal status: INITIAL  Target date:09/04/23 8 weeks  2.  Will begin light resistance exercises pain <=3/10 Baseline:  Goal status: INITIAL Target date:  8 weeks  3.  Full AROM within 10 deg of opp UE without increase in pain Baseline:  Goal status: INITIAL Target date: 10/02/23 12 weeks  4.  Proper scapular control in proprioceptive and CKC strengthening Baseline:  Goal status: INITIAL Target date: 10/03/23  12 weeks  5.  Able to lift cups/plates and other small objects into overhead cabinets without limitation by shoulder Baseline:  Goal status: INITIAL Target date: 10/03/23  12 weeks    PLAN:  PT FREQUENCY: 1-2x/week  PT DURATION: 12 weeks  PLANNED INTERVENTIONS: 97164- PT Re-evaluation,  97110-Therapeutic exercises, 97530- Therapeutic activity, 97112- Neuromuscular re-education, 97535- Self Care, 54098- Manual therapy, [id number]- Aquatic Therapy, 97014- Electrical stimulation (unattended), [phone]- Traction (mechanical), Patient/Family education, Taping, Dry Needling, Joint mobilization, Spinal mobilization, Scar mobilization, Cryotherapy, and Moist heat.  PLAN FOR NEXT SESSION: continue PROM per protocol  Riki Altes, PTA  08/10/23 9:41 AM

## 2023-08-11 NOTE — Progress Notes (Unsigned)
LMP 03/09/2019    Subjective:    Patient ID: Cindy Carson, female    DOB: March 29, 1971, 52 y.o.   MRN: [id number]  HPI: Cindy Carson is a 52 y.o. female  No chief complaint on file.  DIABETES Patient states she has been checking blood sugars at home and they are running in the 240s.  She is under 300 most of the time.  She has forgotten to take her ozempic.  She hasn't been taking the Ozempic for a couple of weeks but plans to restart it.  She went to 30u of Semglee 1 week ago.  Denies blurred vision, headaches, polyuria and polydipsia.    Relevant past medical, surgical, family and social history reviewed and updated as indicated. Interim medical history since our last visit reviewed. Allergies and medications reviewed and updated.  Review of Systems  Endocrine: Negative for polydipsia and polyuria.    Per HPI unless specifically indicated above     Objective:    LMP 03/09/2019   Wt Readings from Last 3 Encounters:  07/07/23 183 lb 3.2 oz (83.1 kg)  06/22/23 184 lb (83.5 kg)  03/05/23 193 lb 3.2 oz (87.6 kg)    Physical Exam Vitals and nursing note reviewed.  HENT:     Head: Normocephalic.     Right Ear: Hearing normal.     Left Ear: Hearing normal.     Nose: Nose normal.  Eyes:     Pupils: Pupils are equal, round, and reactive to light.  Pulmonary:     Effort: Pulmonary effort is normal. No respiratory distress.  Neurological:     Mental Status: She is alert.  Psychiatric:        Mood and Affect: Mood normal.        Behavior: Behavior normal.        Thought Content: Thought content normal.        Judgment: Judgment normal.    Results for orders placed or performed during the hospital encounter of 07/07/23  Glucose, capillary  Result Value Ref Range   Glucose-Capillary 332 (H) 70 - 99 mg/dL  Glucose, capillary  Result Value Ref Range   Glucose-Capillary 318 (H) 70 - 99 mg/dL  Glucose, capillary  Result Value Ref Range   Glucose-Capillary 279 (H) 70 - 99 mg/dL   Glucose, capillary  Result Value Ref Range   Glucose-Capillary 237 (H) 70 - 99 mg/dL   Comment 1 Notify RN    Comment 2 Document in Chart   Glucose, capillary  Result Value Ref Range   Glucose-Capillary 230 (H) 70 - 99 mg/dL  Glucose, capillary  Result Value Ref Range   Glucose-Capillary 208 (H) 70 - 99 mg/dL      Assessment & Plan:   Problem List Items Addressed This Visit       Endocrine   Type 2 diabetes mellitus with hyperglycemia, without long-term current use of insulin (HCC) - Primary      Follow up plan: No follow-ups on file.  This visit was completed via MyChart due to the restrictions of the COVID-19 pandemic. All issues as above were discussed and addressed. Physical exam was done as above through visual confirmation on MyChart. If it was felt that the patient should be evaluated in the office, they were directed there. The patient verbally consented to this visit. Location of the patient: Home Location of the provider: Office Those involved with this call:  Provider: Larae Grooms, NP CMA: Wilhemena Durie, CMA Front Desk/Registration: Servando Snare  This encounter was conducted via video.  I spent 30 dedicated to the care of this patient on the date of this encounter to include previsit review of symptoms, plan of care and follow up, face to face time with the patient, and post visit ordering of testing.

## 2023-08-12 ENCOUNTER — Ambulatory Visit: Payer: 59 | Admitting: Nurse Practitioner

## 2023-08-12 ENCOUNTER — Encounter: Payer: Self-pay | Admitting: Nurse Practitioner

## 2023-08-12 ENCOUNTER — Encounter (HOSPITAL_BASED_OUTPATIENT_CLINIC_OR_DEPARTMENT_OTHER): Payer: 59

## 2023-08-12 ENCOUNTER — Ambulatory Visit (HOSPITAL_BASED_OUTPATIENT_CLINIC_OR_DEPARTMENT_OTHER): Payer: 59 | Admitting: Physical Therapy

## 2023-08-12 VITALS — BP 126/81 | HR 93 | Temp 98.2°F | Ht 62.0 in | Wt 189.2 lb

## 2023-08-12 DIAGNOSIS — M6281 Muscle weakness (generalized): Secondary | ICD-10-CM

## 2023-08-12 DIAGNOSIS — M25612 Stiffness of left shoulder, not elsewhere classified: Secondary | ICD-10-CM

## 2023-08-12 DIAGNOSIS — M25512 Pain in left shoulder: Secondary | ICD-10-CM | POA: Diagnosis not present

## 2023-08-12 DIAGNOSIS — E1165 Type 2 diabetes mellitus with hyperglycemia: Secondary | ICD-10-CM

## 2023-08-12 DIAGNOSIS — Z794 Long term (current) use of insulin: Secondary | ICD-10-CM | POA: Diagnosis not present

## 2023-08-12 DIAGNOSIS — G8929 Other chronic pain: Secondary | ICD-10-CM | POA: Diagnosis not present

## 2023-08-12 LAB — BAYER DCA HB A1C WAIVED: HB A1C (BAYER DCA - WAIVED): 11.3 % — ABNORMAL HIGH (ref 4.8–5.6)

## 2023-08-12 NOTE — Therapy (Signed)
OUTPATIENT PHYSICAL THERAPY TREATMENT   Patient Name: Cindy Carson MRN: [id number] DOB:31-Mar-1971, 52 y.o., female Today's Date: 08/12/2023  END OF SESSION:  PT End of Session - 08/12/23 1510     Visit Number 8    Number of Visits 25    Date for PT Re-Evaluation 10/03/23    Authorization Type UMR    Authorization Time Period 08/25/22 to 10/20/22    PT Start Time 1345    PT Stop Time 1427    PT Time Calculation (min) 42 min    Activity Tolerance Patient tolerated treatment well    Behavior During Therapy [facility] for tasks assessed/performed                Past Medical History:  Diagnosis Date   Anemia 12/09/2018   Chronic low back pain    Chronic neck pain    Diabetes type 2, uncontrolled    diagnosed in 2019   Hyperlipidemia 08/01/2021   Hypertension complicating diabetes (HCC)    Neck pain 07/11/2021   Obesity 12/09/2018   OSA (obstructive sleep apnea)    sleep study done in [place] in 2019 per pt   Rash 01/28/2022   Screening for colon cancer 08/01/2021   Past Surgical History:  Procedure Laterality Date   COLONOSCOPY     COLONOSCOPY N/A 09/03/2021   Procedure: COLONOSCOPY;  Surgeon: Midge Minium, MD;  Location: [facility] ENDOSCOPY;  Service: Endoscopy;  Laterality: N/A;   SHOULDER ARTHROSCOPY WITH ROTATOR CUFF REPAIR Left 07/07/2023   Procedure: LEFT SHOULDER ARTHROSCOPY / DEBRIDEMENT WITH ROTATOR CUFF REPAIR;  Surgeon: Huel Cote, MD;  Location: [facility];  Service: Orthopedics;  Laterality: Left;   TUBAL LIGATION     Patient Active Problem List   Diagnosis Date Noted   Nontraumatic complete tear of left rotator cuff 07/07/2023   Type 2 diabetes mellitus with hyperglycemia, without long-term current use of insulin (HCC) 07/25/2022   Depression, recurrent (HCC) 07/25/2022   Encounter for screening for malignant neoplasm of breast 07/11/2021   Hyperlipidemia associated with type 2 diabetes mellitus (HCC) 07/11/2021   Personal history of  noncompliance with medical treatment, presenting hazards to health 02/15/2019   History of syphilis 12/09/2018   Bursitis of hip 12/09/2018   Anemia 12/09/2018   Obesity 12/09/2018   OSA (obstructive sleep apnea)    HTN (hypertension)    Hypertension complicating diabetes (HCC)    Chronic low back pain    Chronic neck pain      REFERRING PROVIDER:  Huel Cote, MD    REFERRING DIAG: [phone] (ICD-10-CM) - Nontraumatic complete tear of left rotator cuff  S/p Lt RCR  Rationale for Evaluation and Treatment: Rehabilitation  THERAPY DIAG:  Muscle weakness (generalized)  Acute pain of left shoulder  Stiffness of left shoulder, not elsewhere classified  Chronic left shoulder pain  ONSET DATE: DOS 07/07/23   Days since surgery: 36  SUBJECTIVE:  SUBJECTIVE STATEMENT: The patient reports it is a little sore today. She is otherwise doing well.  PERTINENT HISTORY:  DMT2, anemia, chronic neck pain  PAIN:  Are you having pain? Yes: NPRS scale: 3-4/10 Pain location: Lt shoulder Pain description: ache, stab Aggravating factors: constant since surgery Relieving factors: meds  PRECAUTIONS:  Shoulder  RED FLAGS: None   WEIGHT BEARING RESTRICTIONS:  Yes POSTOPERATIVE PLAN: She will follow the rotator cuff repair protocol.  She will be placed on aspirin for blood clot prevention.  She will be seen by physical therapy postop.  She will be seen at 2 weeks postop for suture removal  FALLS:  Has patient fallen in last 6 months? 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   PLOF:  Independent  PATIENT GOALS:  Be able to move it.    OBJECTIVE:  Note: Objective measures were completed at Evaluation unless otherwise  noted.  PATIENT SURVEYS:  FOTO 13  COGNITIVE STATUS: Within functional limits for tasks assessed   SENSATION: WFL  EDEMA:  No  POSTURE:  Eval: forward rounded shoulder as expected in sling  HAND DOMINANCE:  Right     Body Part #1 Shoulder  PALPATION: Eval: very guarded motion through shoulder  UPPER EXTREMITY ROM:  Passive ROM Right/Lt Eval 10/18   Shoulder flexion 20   Shoulder extension    Shoulder abduction 0   Shoulder adduction    Shoulder extension    Shoulder internal rotation    Shoulder external rotation -10   Elbow flexion full   Elbow extension -20    (Blank rows = not tested)      TREATMENT:                                                                                                                              11/20 Manual: gentle PROM into flexion and ER; trigger point release to upper trap; gentle inferior and posterior glides    Table slide 2x10   Scap retraction 2x10   Wrist flexion 2x15 2lbs  Wrist extension 2lbs 2x15    11/18 Manual: gentle PROM into flexion and ER; trigger point release to upper trap; gentle inferior and posterior glides    Table slide 2x10   Scap retraction 2x10   Wrist flexion 2x15 2lbs  Wrist extension 2lbs 2x15     11/13  Manual: PROM R shoulder Supine well arm assist flexion x10  Ball rolls at plinth 2x10  Scap retraction 2x10    Pendulums     PATIENT EDUCATION:  Education details: Teacher, music of condition, POC, HEP, exercise form/rationale Person educated: Patient Education method: Explanation, Demonstration, Tactile cues, Verbal cues, and Handouts Education comprehension: verbalized understanding, returned demonstration, verbal cues required, tactile cues required, and needs further education  HOME EXERCISE PROGRAM: [id number]   ASSESSMENT:  CLINICAL IMPRESSION: Patient continues to make good progress.  She had  improved tolerance table slide today.  She continues to have some  stiffness into flexion but she is close to her range of motion goals per protocol.  We contacted MD about the sling use.  She will be at 6 weeks next week but does not have an appointment till the seventh week.  Will continue to work on wrist exercises.  Therapy will continue to progress as tolerated.  Eval:Patient is a 52 y.o. F who was seen today for physical therapy evaluation and treatment for Lt RCR. Very minimal tolerance to passive motion and very guarded. Asked that she remove her sling when at rest at home with a pillow propped under her upper arm.    REHAB POTENTIAL: Good  CLINICAL DECISION MAKING: Stable/uncomplicated  EVALUATION COMPLEXITY: Low   GOALS: Goals reviewed with patient? Yes  SHORT TERM GOALS: Target date: 4 wks (08/04/23)  Passive flexion to 140 deg without increase in pain Baseline: Goal status:90 degrees 11/11  2.  Passive abd to 60 deg without increase in pain Baseline:  Goal status: 60 achieved 11/11  3.  Passive ER at side to 40 deg without increase in pain Baseline:  Goal status 18 11/11   4.  Able to demo proper scapular retraction for proximal shoulder girdle support Baseline:  Goal status: INITIAL   LONG TERM GOALS: Target date: POC DATE  Able to demo AROM to shoulder height, against gravity, with good scapular control Baseline:  Goal status: INITIAL  Target date:09/04/23 8 weeks  2.  Will begin light resistance exercises pain <=3/10 Baseline:  Goal status: INITIAL Target date:  8 weeks  3.  Full AROM within 10 deg of opp UE without increase in pain Baseline:  Goal status: INITIAL Target date: 10/02/23 12 weeks  4.  Proper scapular control in proprioceptive and CKC strengthening Baseline:  Goal status: INITIAL Target date: 10/03/23  12 weeks  5.  Able to lift cups/plates and other small objects into overhead cabinets without limitation by shoulder Baseline:  Goal status: INITIAL Target date: 10/03/23  12 weeks    PLAN:  PT  FREQUENCY: 1-2x/week  PT DURATION: 12 weeks  PLANNED INTERVENTIONS: 97164- PT Re-evaluation, 97110-Therapeutic exercises, 97530- Therapeutic activity, 97112- Neuromuscular re-education, 97535- Self Care, 84132- Manual therapy, [id number]- Aquatic Therapy, 97014- Electrical stimulation (unattended), [phone]- Traction (mechanical), Patient/Family education, Taping, Dry Needling, Joint mobilization, Spinal mobilization, Scar mobilization, Cryotherapy, and Moist heat.  PLAN FOR NEXT SESSION: continue PROM per protocol  Riki Altes, PTA  08/12/23 3:12 PM

## 2023-08-12 NOTE — Assessment & Plan Note (Addendum)
Chronic.  Ongoing problem.  Last A1c was 12.9%.  A1c checked at visit today and 11.3%.  Will increase Ozempic to 0.5mg  weekly then increase to 1mg  in 1 month.  Continue with Glipizide and Semglee.  Follow up in 1 month for medication adjustment.

## 2023-08-17 ENCOUNTER — Ambulatory Visit (HOSPITAL_BASED_OUTPATIENT_CLINIC_OR_DEPARTMENT_OTHER): Payer: 59 | Admitting: Physical Therapy

## 2023-08-17 DIAGNOSIS — M25612 Stiffness of left shoulder, not elsewhere classified: Secondary | ICD-10-CM

## 2023-08-17 DIAGNOSIS — M25512 Pain in left shoulder: Secondary | ICD-10-CM | POA: Diagnosis not present

## 2023-08-17 DIAGNOSIS — G8929 Other chronic pain: Secondary | ICD-10-CM

## 2023-08-17 DIAGNOSIS — M6281 Muscle weakness (generalized): Secondary | ICD-10-CM | POA: Diagnosis not present

## 2023-08-17 NOTE — Therapy (Signed)
OUTPATIENT PHYSICAL THERAPY TREATMENT   Patient Name: Cindy Carson MRN: [id number] DOB:[birth year], 52 y.o., female Today's Date: 08/17/2023  END OF SESSION:  PT End of Session - 08/17/23 1034     Visit Number 9    Number of Visits 25    Date for PT Re-Evaluation 10/03/23    Authorization Type U[employer]    Authorization Time Period 08/25/22 to 10/20/22    PT Start Time 0800    PT Stop Time 0843    PT Time Calculation (min) 43 min    Activity Tolerance Patient tolerated treatment well    Behavior During Therapy [facility] for tasks assessed/performed                 Past Medical History:  Diagnosis Date   Anemia 12/09/2018   Chronic low back pain    Chronic neck pain    Diabetes type 2, uncontrolled    diagnosed in 2019   Hyperlipidemia 08/01/2021   Hypertension complicating diabetes (HCC)    Neck pain 07/11/2021   Obesity 12/09/2018   OSA (obstructive sleep apnea)    sleep study done in [place] in 2019 per pt   Rash 01/28/2022   Screening for colon cancer 08/01/2021   Past Surgical History:  Procedure Laterality Date   COLONOSCOPY     COLONOSCOPY N/A 09/03/2021   Procedure: COLONOSCOPY;  Surgeon: Midge Minium, MD;  Location: [facility] ENDOSCOPY;  Service: Endoscopy;  Laterality: N/A;   SHOULDER ARTHROSCOPY WITH ROTATOR CUFF REPAIR Left 07/07/2023   Procedure: LEFT SHOULDER ARTHROSCOPY / DEBRIDEMENT WITH ROTATOR CUFF REPAIR;  Surgeon: Huel Cote, MD;  Location: [facility];  Service: Orthopedics;  Laterality: Left;   TUBAL LIGATION     Patient Active Problem List   Diagnosis Date Noted   Nontraumatic complete tear of left rotator cuff 07/07/2023   Type 2 diabetes mellitus with hyperglycemia, without long-term current use of insulin (HCC) 07/25/2022   Depression, recurrent (HCC) 07/25/2022   Encounter for screening for malignant neoplasm of breast 07/11/2021   Hyperlipidemia associated with type 2 diabetes mellitus (HCC) 07/11/2021   Personal history of  noncompliance with medical treatment, presenting hazards to health 02/15/2019   History of syphilis 12/09/2018   Bursitis of hip 12/09/2018   Anemia 12/09/2018   Obesity 12/09/2018   OSA (obstructive sleep apnea)    HTN (hypertension)    Hypertension complicating diabetes (HCC)    Chronic low back pain    Chronic neck pain      REFERRING PROVIDER:  Huel Cote, MD    REFERRING DIAG: [phone] (ICD-10-CM) - Nontraumatic complete tear of left rotator cuff  S/p Lt RCR  Rationale for Evaluation and Treatment: Rehabilitation  THERAPY DIAG:  Muscle weakness (generalized)  Acute pain of left shoulder  Stiffness of left shoulder, not elsewhere classified  Chronic left shoulder pain  ONSET DATE: DOS 07/07/23   Days since surgery: 41  SUBJECTIVE:  SUBJECTIVE STATEMENT: The patient reports it has been a little sore. She has found herself rolling over on it at night. Therapy spoke with MD who will allow her to start weaning out of the sling this week. Her appointment isn't until week 7 2nd to the holiday.     PERTINENT HISTORY:  DMT2, anemia, chronic neck pain  PAIN:  Are you having pain? Yes: NPRS scale: 3-4/10 Pain location: Lt shoulder Pain description: ache, stab Aggravating factors: constant since surgery Relieving factors: meds  PRECAUTIONS:  Shoulder  RED FLAGS: None   WEIGHT BEARING RESTRICTIONS:  Yes POSTOPERATIVE PLAN: She will follow the rotator cuff repair protocol.  She will be placed on aspirin for blood clot prevention.  She will be seen by physical therapy postop.  She will be seen at 2 weeks postop for suture removal  FALLS:  Has patient fallen in last 6 months? 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   PLOF:  Independent  PATIENT GOALS:  Be able to move it.    OBJECTIVE:  Note: Objective measures were completed at Evaluation unless otherwise noted.  PATIENT SURVEYS:  FOTO 13  COGNITIVE STATUS: Within functional limits for tasks assessed   SENSATION: WFL  EDEMA:  No  POSTURE:  Eval: forward rounded shoulder as expected in sling  HAND DOMINANCE:  Right     Body Part #1 Shoulder  PALPATION: Eval: very guarded motion through shoulder  UPPER EXTREMITY ROM:  Passive ROM Right/Lt Eval 10/18   Shoulder flexion 20   Shoulder extension    Shoulder abduction 0   Shoulder adduction    Shoulder extension    Shoulder internal rotation    Shoulder external rotation -10   Elbow flexion full   Elbow extension -20    (Blank rows = not tested)      TREATMENT:                                                                                                                              11/20 Manual: gentle PROM into flexion and ER; trigger point release to upper trap; gentle inferior and posterior glides   Began AAROM   ER with cane 5x3  Press 3x3 with cuing   Review of how to properly wean out of the sling  Scap retraction 2x10   Updated and reviewed HEP  11/18 Manual: gentle PROM into flexion and ER; trigger point release to upper trap; gentle inferior and posterior glides    Table slide 2x10   Scap retraction 2x10   Wrist flexion 2x15 2lbs  Wrist extension 2lbs 2x15     11/13  Manual: PROM R shoulder Supine well arm assist flexion x10  Ball rolls at plinth 2x10  Scap retraction 2x10    Pendulums     PATIENT EDUCATION:  Education details: Anatomy of condition, POC, HEP, exercise form/rationale Person  educated: Patient Education method: Explanation, Demonstration, Tactile cues, Verbal cues, and Handouts Education comprehension: verbalized understanding, returned demonstration, verbal cues required, tactile cues required,  and needs further education  HOME EXERCISE PROGRAM: [email or web address]   ASSESSMENT:  CLINICAL IMPRESSION: The patient is now at 6 weeks post op. We began light self strethcing for ER and flexion using the wand. She had some difficulty with wand press. She was advised to tray in a few days  Eval:Patient is a 52 y.o. F who was seen today for physical therapy evaluation and treatment for Lt RCR. Very minimal tolerance to passive motion and very guarded. Asked that she remove her sling when at rest at home with a pillow propped under her upper arm.    REHAB POTENTIAL: Good  CLINICAL DECISION MAKING: Stable/uncomplicated  EVALUATION COMPLEXITY: Low   GOALS: Goals reviewed with patient? Yes  SHORT TERM GOALS: Target date: 4 wks (08/04/23)  Passive flexion to 140 deg without increase in pain Baseline: Goal status:90 degrees 11/11  2.  Passive abd to 60 deg without increase in pain Baseline:  Goal status: 60 achieved 11/11  3.  Passive ER at side to 40 deg without increase in pain Baseline:  Goal status 18 11/11   4.  Able to demo proper scapular retraction for proximal shoulder girdle support Baseline:  Goal status: INITIAL   LONG TERM GOALS: Target date: POC DATE  Able to demo AROM to shoulder height, against gravity, with good scapular control Baseline:  Goal status: INITIAL  Target date:09/04/23 8 weeks  2.  Will begin light resistance exercises pain <=3/10 Baseline:  Goal status: INITIAL Target date:  8 weeks  3.  Full AROM within 10 deg of opp UE without increase in pain Baseline:  Goal status: INITIAL Target date: 10/02/23 12 weeks  4.  Proper scapular control in proprioceptive and CKC strengthening Baseline:  Goal status: INITIAL Target date: 10/03/23  12 weeks  5.  Able to lift cups/plates and other small objects into overhead cabinets without limitation by shoulder Baseline:  Goal status: INITIAL Target date: 10/03/23  12 weeks    PLAN:  PT FREQUENCY:  1-2x/week  PT DURATION: 12 weeks  PLANNED INTERVENTIONS: 97164- PT Re-evaluation, 97110-Therapeutic exercises, 97530- Therapeutic activity, 97112- Neuromuscular re-education, 97535- Self Care, 74259- Manual therapy, [id number]- Aquatic Therapy, 97014- Electrical stimulation (unattended), [phone]- Traction (mechanical), Patient/Family education, Taping, Dry Needling, Joint mobilization, Spinal mobilization, Scar mobilization, Cryotherapy, and Moist heat.  PLAN FOR NEXT SESSION: continue PROM per protocol  Lorayne Bender PT DPT  08/17/23 10:35 AM

## 2023-08-19 ENCOUNTER — Encounter (HOSPITAL_BASED_OUTPATIENT_CLINIC_OR_DEPARTMENT_OTHER): Payer: 59 | Admitting: Physical Therapy

## 2023-08-24 ENCOUNTER — Encounter (HOSPITAL_BASED_OUTPATIENT_CLINIC_OR_DEPARTMENT_OTHER): Payer: Self-pay | Admitting: Physical Therapy

## 2023-08-24 ENCOUNTER — Ambulatory Visit (HOSPITAL_BASED_OUTPATIENT_CLINIC_OR_DEPARTMENT_OTHER): Payer: 59 | Attending: Orthopaedic Surgery | Admitting: Physical Therapy

## 2023-08-24 DIAGNOSIS — M25512 Pain in left shoulder: Secondary | ICD-10-CM

## 2023-08-24 DIAGNOSIS — G8929 Other chronic pain: Secondary | ICD-10-CM | POA: Diagnosis not present

## 2023-08-24 DIAGNOSIS — M25612 Stiffness of left shoulder, not elsewhere classified: Secondary | ICD-10-CM | POA: Diagnosis not present

## 2023-08-24 DIAGNOSIS — M6281 Muscle weakness (generalized): Secondary | ICD-10-CM

## 2023-08-24 NOTE — Therapy (Signed)
OUTPATIENT PHYSICAL THERAPY TREATMENT   Patient Name: Cindy Carson MRN: [id number] DOB:1970/11/09, 52 y.o., female Today's Date: 08/24/2023  END OF SESSION:        Past Medical History:  Diagnosis Date   Anemia 12/09/2018   Chronic low back pain    Chronic neck pain    Diabetes type 2, uncontrolled    diagnosed in 2019   Hyperlipidemia 08/01/2021   Hypertension complicating diabetes (HCC)    Neck pain 07/11/2021   Obesity 12/09/2018   OSA (obstructive sleep apnea)    sleep study done in [place] in 2019 per pt   Rash 01/28/2022   Screening for colon cancer 08/01/2021   Past Surgical History:  Procedure Laterality Date   COLONOSCOPY     COLONOSCOPY N/A 09/03/2021   Procedure: COLONOSCOPY;  Surgeon: Midge Minium, MD;  Location: [facility] ENDOSCOPY;  Service: Endoscopy;  Laterality: N/A;   SHOULDER ARTHROSCOPY WITH ROTATOR CUFF REPAIR Left 07/07/2023   Procedure: LEFT SHOULDER ARTHROSCOPY / DEBRIDEMENT WITH ROTATOR CUFF REPAIR;  Surgeon: Huel Cote, MD;  Location: [facility];  Service: Orthopedics;  Laterality: Left;   TUBAL LIGATION     Patient Active Problem List   Diagnosis Date Noted   Nontraumatic complete tear of left rotator cuff 07/07/2023   Type 2 diabetes mellitus with hyperglycemia, without long-term current use of insulin (HCC) 07/25/2022   Depression, recurrent (HCC) 07/25/2022   Encounter for screening for malignant neoplasm of breast 07/11/2021   Hyperlipidemia associated with type 2 diabetes mellitus (HCC) 07/11/2021   Personal history of noncompliance with medical treatment, presenting hazards to health 02/15/2019   History of syphilis 12/09/2018   Bursitis of hip 12/09/2018   Anemia 12/09/2018   Obesity 12/09/2018   OSA (obstructive sleep apnea)    HTN (hypertension)    Hypertension complicating diabetes (HCC)    Chronic low back pain    Chronic neck pain      REFERRING PROVIDER:  Huel Cote, MD    REFERRING DIAG: [phone]  (ICD-10-CM) - Nontraumatic complete tear of left rotator cuff  S/p Lt RCR  Rationale for Evaluation and Treatment: Rehabilitation  THERAPY DIAG:  No diagnosis found.  ONSET DATE: DOS 07/07/23   Days since surgery: 48  SUBJECTIVE:                                                                                                                                                                                           SUBJECTIVE STATEMENT: The patient has been doing well out of the sling. She comes in today with her arm in a sling but at home she has been out  of it. She has minimal pain    PERTINENT HISTORY:  DMT2, anemia, chronic neck pain  PAIN:  Are you having pain? Yes: NPRS scale: 3/10 Pain location: Lt shoulder Pain description: ache, stab Aggravating factors: constant since surgery Relieving factors: meds  PRECAUTIONS:  Shoulder  RED FLAGS: None   WEIGHT BEARING RESTRICTIONS:  Yes POSTOPERATIVE PLAN: She will follow the rotator cuff repair protocol.  She will be placed on aspirin for blood clot prevention.  She will be seen by physical therapy postop.  She will be seen at 2 weeks postop for suture removal  FALLS:  Has patient fallen in last 6 months? 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   PLOF:  Independent  PATIENT GOALS:  Be able to move it.    OBJECTIVE:  Note: Objective measures were completed at Evaluation unless otherwise noted.  PATIENT SURVEYS:  FOTO 13  COGNITIVE STATUS: Within functional limits for tasks assessed   SENSATION: WFL  EDEMA:  No  POSTURE:  Eval: forward rounded shoulder as expected in sling  HAND DOMINANCE:  Right     Body Part #1 Shoulder  PALPATION: Eval: very guarded motion through shoulder  UPPER EXTREMITY ROM:  Passive ROM Right/Lt Eval 10/18   Shoulder flexion 20   Shoulder extension    Shoulder abduction 0   Shoulder adduction     Shoulder extension    Shoulder internal rotation    Shoulder external rotation -10   Elbow flexion full   Elbow extension -20    (Blank rows = not tested)      TREATMENT:                                                                                                                              12/2 Manual: gentle PROM into flexion and ER; trigger point release to upper trap; gentle inferior and posterior glides   ER with cane 5x3  Press 3x5 with cuing   RTC Iso:  All 5x 5 sec hold   ER IR  Flex    Reviewed an updated HEP    11/20 Manual: gentle PROM into flexion and ER; trigger point release to upper trap; gentle inferior and posterior glides   Began AAROM   ER with cane 5x3  Press 3x3 with cuing   Review of how to properly wean out of the sling  Scap retraction 2x10   Updated and reviewed HEP  11/18 Manual: gentle PROM into flexion and ER; trigger point release to upper trap; gentle inferior and posterior glides    Table slide 2x10   Scap retraction 2x10   Wrist flexion 2x15 2lbs  Wrist extension 2lbs 2x15     11/13  Manual: PROM R shoulder Supine well arm assist flexion x10  Ball rolls at plinth 2x10  Scap retraction 2x10    Pendulums  PATIENT EDUCATION:  Education details: Teacher, music of condition, POC, HEP, exercise form/rationale Person educated: Patient Education method: Explanation, Demonstration, Tactile cues, Verbal cues, and Handouts Education comprehension: verbalized understanding, returned demonstration, verbal cues required, tactile cues required, and needs further education  HOME EXERCISE PROGRAM: [id number]   ASSESSMENT:  CLINICAL IMPRESSION: The patient continues to make good progress. Her passive flexion was measured at 110 with no significant pain> her IR was slightly limited but improved with manual therapy. We reviewed self ER PROM at home. She was advised to keep it pain free. Sh had mild pain after. She is set  to go back to work ext week but only just came out of the sling and has yet to start active movement with her shoulder. We continue to work on [employer] and isometrics per protocol. We added isometrics. She was given an updated HEP   REHAB POTENTIAL: Good  CLINICAL DECISION MAKING: Stable/uncomplicated  EVALUATION COMPLEXITY: Low   GOALS: Goals reviewed with patient? Yes  SHORT TERM GOALS: Target date: 4 wks (08/04/23)  Passive flexion to 140 deg without increase in pain Baseline: Goal status:90 degrees 11/11  2.  Passive abd to 60 deg without increase in pain Baseline:  Goal status: 60 achieved 11/11  3.  Passive ER at side to 40 deg without increase in pain Baseline:  Goal status 18 11/11   4.  Able to demo proper scapular retraction for proximal shoulder girdle support Baseline:  Goal status: progressing 12/2   LONG TERM GOALS: Target date: POC DATE  Able to demo AROM to shoulder height, against gravity, with good scapular control Baseline:  Goal status: INITIAL  Target date:09/04/23 8 weeks has not begn active motion 12/2   2.  Will begin light resistance exercises pain <=3/10 Baseline:  Goal status: INITIAL Target date:  8 weeks  3.  Full AROM within 10 deg of opp UE without increase in pain Baseline:  Goal status: INITIAL Target date: 10/02/23 12 weeks  4.  Proper scapular control in proprioceptive and CKC strengthening Baseline:  Goal status: INITIAL Target date: 10/03/23  12 weeks  5.  Able to lift cups/plates and other small objects into overhead cabinets without limitation by shoulder Baseline:  Goal status: INITIAL Target date: 10/03/23  12 weeks    PLAN:  PT FREQUENCY: 1-2x/week  PT DURATION: 12 weeks  PLANNED INTERVENTIONS: 97164- PT Re-evaluation, 97110-Therapeutic exercises, 97530- Therapeutic activity, 97112- Neuromuscular re-education, 97535- Self Care, 69629- Manual therapy, [id number]- Aquatic Therapy, 97014- Electrical stimulation (unattended),  [phone]- Traction (mechanical), Patient/Family education, Taping, Dry Needling, Joint mobilization, Spinal mobilization, Scar mobilization, Cryotherapy, and Moist heat.  PLAN FOR NEXT SESSION: continue PROM per protocol  Lorayne Bender PT DPT  08/24/23 8:09 AM

## 2023-08-26 ENCOUNTER — Encounter (HOSPITAL_BASED_OUTPATIENT_CLINIC_OR_DEPARTMENT_OTHER): Payer: Self-pay

## 2023-08-26 ENCOUNTER — Ambulatory Visit (HOSPITAL_BASED_OUTPATIENT_CLINIC_OR_DEPARTMENT_OTHER): Payer: 59 | Admitting: Orthopaedic Surgery

## 2023-08-26 ENCOUNTER — Ambulatory Visit (HOSPITAL_BASED_OUTPATIENT_CLINIC_OR_DEPARTMENT_OTHER): Payer: 59

## 2023-08-26 ENCOUNTER — Ambulatory Visit: Payer: 59 | Admitting: Nurse Practitioner

## 2023-08-26 DIAGNOSIS — M6281 Muscle weakness (generalized): Secondary | ICD-10-CM | POA: Diagnosis not present

## 2023-08-26 DIAGNOSIS — M25512 Pain in left shoulder: Secondary | ICD-10-CM | POA: Diagnosis not present

## 2023-08-26 DIAGNOSIS — M75122 Complete rotator cuff tear or rupture of left shoulder, not specified as traumatic: Secondary | ICD-10-CM

## 2023-08-26 DIAGNOSIS — M25612 Stiffness of left shoulder, not elsewhere classified: Secondary | ICD-10-CM

## 2023-08-26 DIAGNOSIS — G8929 Other chronic pain: Secondary | ICD-10-CM | POA: Diagnosis not present

## 2023-08-26 NOTE — Therapy (Signed)
OUTPATIENT PHYSICAL THERAPY TREATMENT   Patient Name: Cindy Carson MRN: [id number] DOB:[birth year], 52 y.o., female Today's Date: 08/26/2023  END OF SESSION:  PT End of Session - 08/26/23 0828     Visit Number 11    Number of Visits 25    Date for PT Re-Evaluation 10/03/23    Authorization Type UMR    Authorization Time Period 08/25/22 to 10/20/22    PT Start Time 0803    PT Stop Time 0845    PT Time Calculation (min) 42 min    Activity Tolerance Patient tolerated treatment well    Behavior During Therapy [facility] for tasks assessed/performed                  Past Medical History:  Diagnosis Date   Anemia 12/09/2018   Chronic low back pain    Chronic neck pain    Diabetes type 2, uncontrolled    diagnosed in 2019   Hyperlipidemia 08/01/2021   Hypertension complicating diabetes (HCC)    Neck pain 07/11/2021   Obesity 12/09/2018   OSA (obstructive sleep apnea)    sleep study done in [place] in 2019 per pt   Rash 01/28/2022   Screening for colon cancer 08/01/2021   Past Surgical History:  Procedure Laterality Date   COLONOSCOPY     COLONOSCOPY N/A 09/03/2021   Procedure: COLONOSCOPY;  Surgeon: Midge Minium, MD;  Location: [facility] ENDOSCOPY;  Service: Endoscopy;  Laterality: N/A;   SHOULDER ARTHROSCOPY WITH ROTATOR CUFF REPAIR Left 07/07/2023   Procedure: LEFT SHOULDER ARTHROSCOPY / DEBRIDEMENT WITH ROTATOR CUFF REPAIR;  Surgeon: Huel Cote, MD;  Location: [facility];  Service: Orthopedics;  Laterality: Left;   TUBAL LIGATION     Patient Active Problem List   Diagnosis Date Noted   Nontraumatic complete tear of left rotator cuff 07/07/2023   Type 2 diabetes mellitus with hyperglycemia, without long-term current use of insulin (HCC) 07/25/2022   Depression, recurrent (HCC) 07/25/2022   Encounter for screening for malignant neoplasm of breast 07/11/2021   Hyperlipidemia associated with type 2 diabetes mellitus (HCC) 07/11/2021   Personal history of  noncompliance with medical treatment, presenting hazards to health 02/15/2019   History of syphilis 12/09/2018   Bursitis of hip 12/09/2018   Anemia 12/09/2018   Obesity 12/09/2018   OSA (obstructive sleep apnea)    HTN (hypertension)    Hypertension complicating diabetes (HCC)    Chronic low back pain    Chronic neck pain      REFERRING PROVIDER:  Huel Cote, MD    REFERRING DIAG: [phone] (ICD-10-CM) - Nontraumatic complete tear of left rotator cuff  S/p Lt RCR  Rationale for Evaluation and Treatment: Rehabilitation  THERAPY DIAG:  Muscle weakness (generalized)  Acute pain of left shoulder  Stiffness of left shoulder, not elsewhere classified  ONSET DATE: DOS 07/07/23   Days since surgery: 50  SUBJECTIVE:  SUBJECTIVE STATEMENT: Pt arrives with sling stating "He told me to wear it when I'm out, but I don't wear it at home." Continues to have difficulty sleeping. "I don't feel ready to go back to work yet."   PERTINENT HISTORY:  DMT2, anemia, chronic neck pain  PAIN:  Are you having pain? No  PRECAUTIONS:  Shoulder  RED FLAGS: None   WEIGHT BEARING RESTRICTIONS:  Yes POSTOPERATIVE PLAN: She will follow the rotator cuff repair protocol.  She will be placed on aspirin for blood clot prevention.  She will be seen by physical therapy postop.  She will be seen at 2 weeks postop for suture removal  FALLS:  Has patient fallen in last 6 months? 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   PLOF:  Independent  PATIENT GOALS:  Be able to move it.    OBJECTIVE:  Note: Objective measures were completed at Evaluation unless otherwise noted.  PATIENT SURVEYS:  FOTO 13  COGNITIVE STATUS: Within functional  limits for tasks assessed   SENSATION: WFL  EDEMA:  No  POSTURE:  Eval: forward rounded shoulder as expected in sling  HAND DOMINANCE:  Right     Body Part #1 Shoulder  PALPATION: Eval: very guarded motion through shoulder  UPPER EXTREMITY ROM:  Passive ROM Right/Lt Eval 10/18   Shoulder flexion 20   Shoulder extension    Shoulder abduction 0   Shoulder adduction    Shoulder extension    Shoulder internal rotation    Shoulder external rotation -10   Elbow flexion full   Elbow extension -20    (Blank rows = not tested)      TREATMENT:                                                                                                                               12/4: -PROM all planes STM to deltoid, pec, bicep -well arm assist flexion supine x10 -AAROM with therapist assist flexion in supine-x6 (challenging) -posterior shulder rolls x10 -scapular retraction x15 -pendulums -Ball rolls at table (green physioball) x15 flexion, x10 scaption -pulleys flexionx63min    12/2 Manual: gentle PROM into flexion and ER; trigger point release to upper trap; gentle inferior and posterior glides   ER with cane 5x3  Press 3x5 with cuing   RTC Iso:  All 5x 5 sec hold   ER IR  Flex    Reviewed an updated HEP    11/20 Manual: gentle PROM into flexion and ER; trigger point release to upper trap; gentle inferior and posterior glides   Began AAROM   ER with cane 5x3  Press 3x3 with cuing   Review of how to properly wean out of the sling  Scap retraction 2x10   Updated and reviewed HEP  11/18 Manual: gentle PROM into flexion and ER; trigger point release to upper trap; gentle inferior  and posterior glides    Table slide 2x10   Scap retraction 2x10   Wrist flexion 2x15 2lbs  Wrist extension 2lbs 2x15     11/13  Manual: PROM R shoulder Supine well arm assist flexion x10  Ball rolls at plinth 2x10  Scap retraction 2x10     Pendulums     PATIENT EDUCATION:  Education details: Teacher, music of condition, POC, HEP, exercise form/rationale Person educated: Patient Education method: Explanation, Demonstration, Tactile cues, Verbal cues, and Handouts Education comprehension: verbalized understanding, returned demonstration, verbal cues required, tactile cues required, and needs further education  HOME EXERCISE PROGRAM: [id number]   ASSESSMENT:  CLINICAL IMPRESSION: Pt 7 weeks s/p at this time. Has f/u with MD today. She remains gaurded with passive movement and tender to palpation of lateral shoulder/arm. Continued to work on imrpving availble shoulder ROM. Pt unable to complete active flexion in supine at this time, requiring AAROM in short arc position with assist from clinician. Progressed AAROM in standing/seated positions with good tolerance overall. Did have difficulty with scaption ball roll initially, though improved with time. . She does improve with relaxation with focus/cuing. Will continue to work on improving pain free active and passive movement. Pt to continue using ice PRN.  REHAB POTENTIAL: Good  CLINICAL DECISION MAKING: Stable/uncomplicated  EVALUATION COMPLEXITY: Low   GOALS: Goals reviewed with patient? Yes  SHORT TERM GOALS: Target date: 4 wks (08/04/23)  Passive flexion to 140 deg without increase in pain Baseline: Goal status:90 degrees 11/11  2.  Passive abd to 60 deg without increase in pain Baseline:  Goal status: 60 achieved 11/11  3.  Passive ER at side to 40 deg without increase in pain Baseline:  Goal status 18 11/11   4.  Able to demo proper scapular retraction for proximal shoulder girdle support Baseline:  Goal status: progressing 12/2   LONG TERM GOALS: Target date: POC DATE  Able to demo AROM to shoulder height, against gravity, with good scapular control Baseline:  Goal status: INITIAL  Target date:09/04/23 8 weeks has not begn active motion 12/2   2.  Will  begin light resistance exercises pain <=3/10 Baseline:  Goal status: INITIAL Target date:  8 weeks  3.  Full AROM within 10 deg of opp UE without increase in pain Baseline:  Goal status: INITIAL Target date: 10/02/23 12 weeks  4.  Proper scapular control in proprioceptive and CKC strengthening Baseline:  Goal status: INITIAL Target date: 10/03/23  12 weeks  5.  Able to lift cups/plates and other small objects into overhead cabinets without limitation by shoulder Baseline:  Goal status: INITIAL Target date: 10/03/23  12 weeks    PLAN:  PT FREQUENCY: 1-2x/week  PT DURATION: 12 weeks  PLANNED INTERVENTIONS: 97164- PT Re-evaluation, 97110-Therapeutic exercises, 97530- Therapeutic activity, 97112- Neuromuscular re-education, 97535- Self Care, 84132- Manual therapy, [id number]- Aquatic Therapy, 97014- Electrical stimulation (unattended), [id number]- Traction (mechanical), Patient/Family education, Taping, Dry Needling, Joint mobilization, Spinal mobilization, Scar mobilization, Cryotherapy, and Moist heat.  PLAN FOR NEXT SESSION: continue PROM per protocol  Riki Altes, PTA   08/26/23 9:04 AM

## 2023-08-26 NOTE — Progress Notes (Signed)
Post Operative Evaluation    Procedure/Date of Surgery: Left shoulder rotator cuff repair with collagen patch augmentation 10/15  Interval History:   6 weeks status post left shoulder rotator cuff repair doing extremely well.  She continues to progress with physical therapy.  Overall she has minimal pain aside from sleeping.  She is continuing to improve range of motion and has progressed to active range of motion at this time.   PMH/PSH/Family History/Social History/Meds/Allergies:    Past Medical History:  Diagnosis Date   Anemia 12/09/2018   Chronic low back pain    Chronic neck pain    Diabetes type 2, uncontrolled    diagnosed in 2019   Hyperlipidemia 08/01/2021   Hypertension complicating diabetes (HCC)    Neck pain 07/11/2021   Obesity 12/09/2018   OSA (obstructive sleep apnea)    sleep study done in [place] in 2019 per pt   Rash 01/28/2022   Screening for colon cancer 08/01/2021   Past Surgical History:  Procedure Laterality Date   COLONOSCOPY     COLONOSCOPY N/A 09/03/2021   Procedure: COLONOSCOPY;  Surgeon: Midge Minium, MD;  [facility] ENDOSCOPY;  Service: Endoscopy;  Laterality: N/A;   SHOULDER ARTHROSCOPY WITH ROTATOR CUFF REPAIR Left 07/07/2023   Procedure: LEFT SHOULDER ARTHROSCOPY / DEBRIDEMENT WITH ROTATOR CUFF REPAIR;  Surgeon: Huel Cote, MD;  Location: [facility];  Service: Orthopedics;  Laterality: Left;   TUBAL LIGATION     Social History   Socioeconomic History   Marital status: Legally Separated    Spouse name: Not on file   Number of children: Not on file   Years of education: Not on file   Highest education level: Associate degree: academic program  Occupational History   Not on file  Tobacco Use   Smoking status: Never   Smokeless tobacco: Never  Vaping Use   Vaping status: Never Used  Substance and Sexual Activity   Alcohol use: Never   Drug use: Never   Sexual activity:  Not Currently    Partners: Male    Birth control/protection: Surgical, Post-menopausal    Comment: BTL, First IC <16 y/o, >5 Partners, Hx of RPR, GC  Other Topics Concern   Not on file  Social History Narrative   Not on file   Social Determinants of Health   Financial Resource Strain: Low Risk  (08/11/2023)   Overall Financial Resource Strain (CARDIA)    Difficulty of Paying Living Expenses: Not very hard  Food Insecurity: Food Insecurity Present (08/11/2023)   Hunger Vital Sign    Worried About Running Out of Food in the Last Year: Sometimes true    Ran Out of Food in the Last Year: Sometimes true  Transportation Needs: No Transportation Needs (08/11/2023)   PRAPARE - Administrator, Civil Service (Medical): No    Lack of Transportation (Non-Medical): No  Physical Activity: Insufficiently Active (08/11/2023)   Exercise Vital Sign    Days of Exercise per Week: 2 days    Minutes of Exercise per Session: 10 min  Stress: No Stress Concern Present (08/11/2023)   [employer] of Occupational Health - Occupational Stress Questionnaire    Feeling of Stress : Only a little  Social Connections: Socially Isolated (08/11/2023)   Social Connection and Isolation Panel [NHANES]  Frequency of Communication with Friends and Family: More than three times a week    Frequency of Social Gatherings with Friends and Family: Once a week    Attends Religious Services: Never    Database administrator or Organizations: No    Attends Engineer, structural: Not on file    Marital Status: Separated   Family History  Problem Relation Age of Onset   Varicose Veins Father    Hypertension Sister    Diabetes Sister    Diabetes Sister    Heart murmur Brother    Cancer Maternal Grandmother    Allergies  Allergen Reactions   Nsaids Nausea Only    nausea   Tape Rash    Surgical tape    Current Outpatient Medications  Medication Sig Dispense Refill   amLODipine (NORVASC) 5  MG tablet Take 1 tablet by mouth once daily 90 tablet 1   aspirin EC 325 MG tablet Take 1 tablet (325 mg total) by mouth daily. (Patient not taking: Reported on 08/12/2023) 30 tablet 0   atorvastatin (LIPITOR) 20 MG tablet Take 1 tablet by mouth once daily 90 tablet 1   Blood Glucose Monitoring Suppl (BLOOD GLUCOSE MONITOR SYSTEM) w/Device KIT 1 each by Does not apply route in the morning, at noon, and at bedtime. 1 kit 0   DULoxetine (CYMBALTA) 30 MG capsule Take 1 capsule (30 mg total) by mouth daily. 90 capsule 1   glipiZIDE (GLUCOTROL) 5 MG tablet Take 1 tablet (5 mg total) by mouth 2 (two) times daily before a meal. 180 tablet 1   Glucose Blood (BLOOD GLUCOSE TEST STRIPS) STRP For monitoring blood sugar levels up to 4 times a day. May substitute to any manufacturer covered by patient's insurance. 200 strip 99   insulin glargine-yfgn (SEMGLEE, YFGN,) 100 UNIT/ML Pen Inject 10 Units into the skin at bedtime. Take 10 UNITS nightly, may go up by 3 UNITS every 3 days if morning sugars are greater than 130 in increments of (13 UNITS, 16 UNITS, 19 UNITS, 22 UNITS, 25 UNITS every 3 days based on morning sugars) do not go higher than 25 UNITS. 15 mL 2   Insulin Pen Needle (UNIFINE PENTIPS) 31G X 6 MM MISC Use daily with insulin 100 each 1   Lancet Device MISC For monitoring blood sugar up to 4 times a day. May substitute to any manufacturer covered by patient's insurance. 200 each 99   Lancets (FREESTYLE) lancets Test 4x daily 200 each 11   Semaglutide, 1 MG/DOSE, 4 MG/3ML SOPN Inject 1 mg into the skin once a week. 3 mL 0   No current facility-administered medications for this visit.   No results found.  Review of Systems:   A ROS was performed including pertinent positives and negatives as documented in the HPI.   Musculoskeletal Exam:     Left shoulder is well-appearing incisions are without erythema or drainage.  Active forward elevation is to 80 degrees in the standing position..  External  rotation at the side is to 40 degrees.  Internal rotation deferred today.  Distal neurosensory exam is intact  Imaging:      I personally reviewed and interpreted the radiographs.   Assessment:   6 weeks status post left shoulder rotator cuff repair overall doing well.  At this time she will begin active range of motion.  She may discontinue her sling at this time.  I will plan to see her back in 6 weeks for reassessment  Plan :    -Return to clinic 6 weeks for reassessment      I personally saw and evaluated the patient, and participated in the management and treatment plan.  Huel Cote, MD Attending Physician, Orthopedic Surgery  This document was dictated using Dragon voice recognition software. A reasonable attempt at proof reading has been made to minimize errors.

## 2023-08-27 IMAGING — DX DG CHEST 2V
2 series · 2 of 2 positions shown · non-contrast
Comparison: None.

CLINICAL DATA: Positive TB test

EXAM:
CHEST - 2 VIEW

[chest pa]
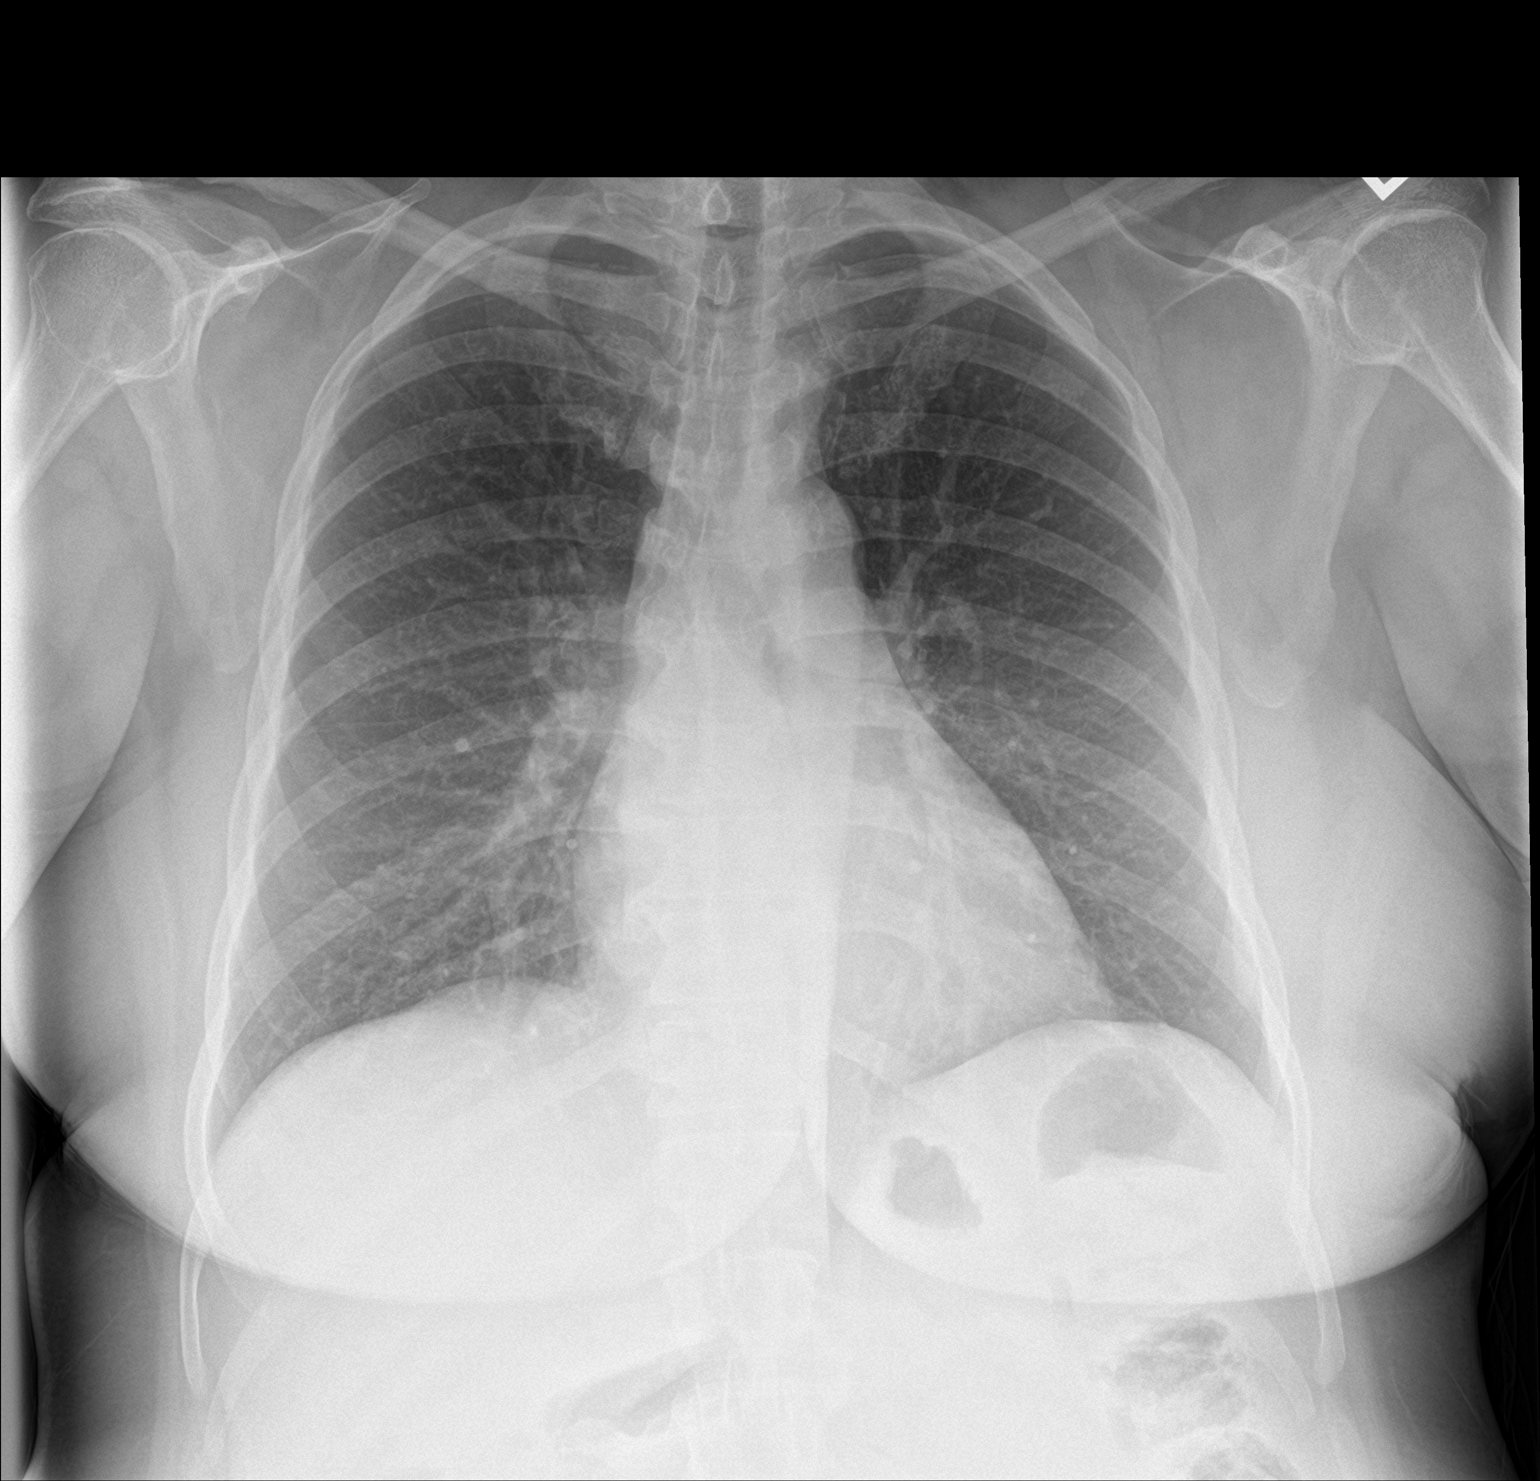

[chest lat]
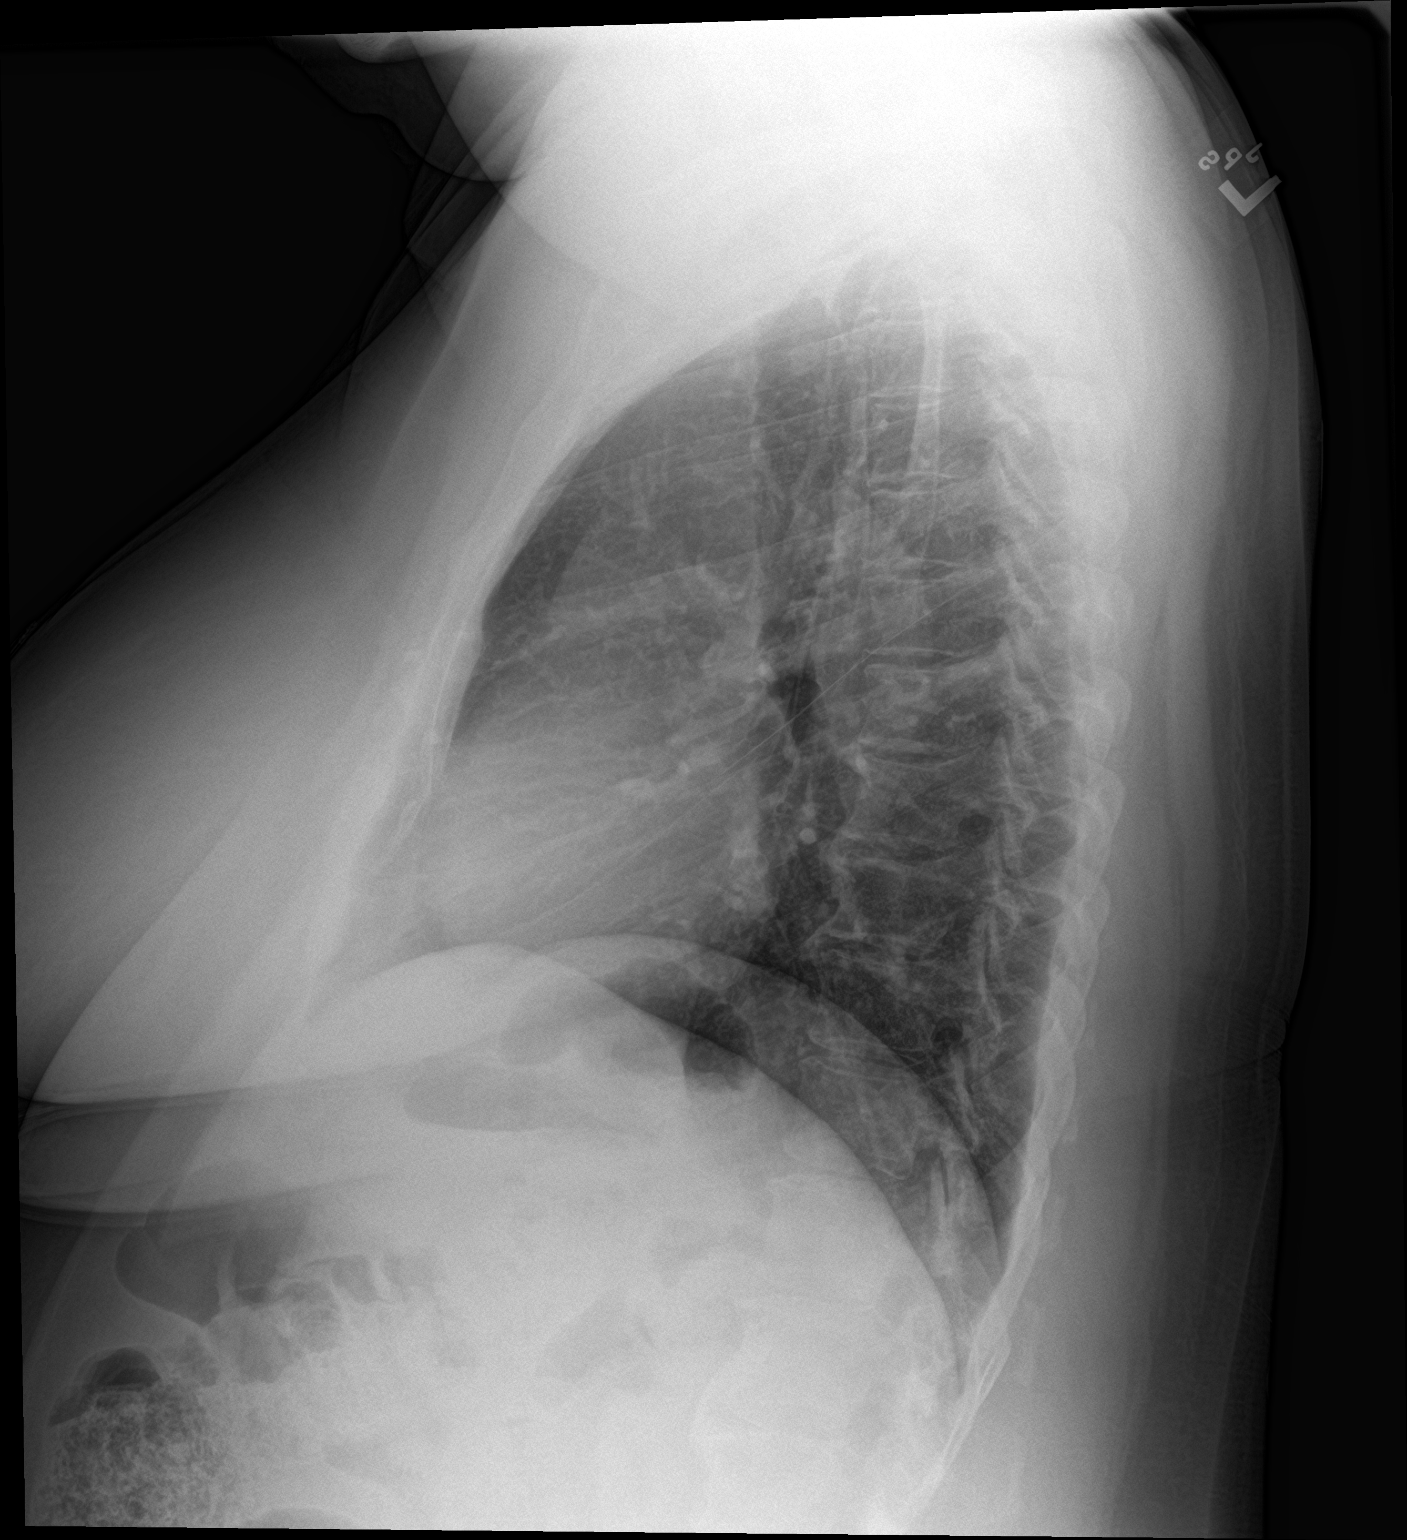

[2 of 2 positions shown; findings below may reference images not displayed]

FINDINGS: Normal heart size and vascularity. Subcentimeter calcified
granulomas in the perihilar regions and lower lungs bilaterally. No
acute airspace process, collapse, or consolidation. Negative for
edema, effusion, or pneumothorax. Trachea midline. Degenerative
changes of spine.
IMPRESSION: No acute chest finding.  Remote granulomatous disease.

## 2023-08-31 ENCOUNTER — Encounter (HOSPITAL_BASED_OUTPATIENT_CLINIC_OR_DEPARTMENT_OTHER): Payer: Self-pay

## 2023-08-31 ENCOUNTER — Telehealth (HOSPITAL_BASED_OUTPATIENT_CLINIC_OR_DEPARTMENT_OTHER): Payer: Self-pay | Admitting: Orthopaedic Surgery

## 2023-08-31 ENCOUNTER — Ambulatory Visit (HOSPITAL_BASED_OUTPATIENT_CLINIC_OR_DEPARTMENT_OTHER): Payer: 59

## 2023-08-31 DIAGNOSIS — M25612 Stiffness of left shoulder, not elsewhere classified: Secondary | ICD-10-CM

## 2023-08-31 DIAGNOSIS — M6281 Muscle weakness (generalized): Secondary | ICD-10-CM | POA: Diagnosis not present

## 2023-08-31 DIAGNOSIS — M25512 Pain in left shoulder: Secondary | ICD-10-CM

## 2023-08-31 DIAGNOSIS — G8929 Other chronic pain: Secondary | ICD-10-CM | POA: Diagnosis not present

## 2023-08-31 NOTE — Telephone Encounter (Signed)
Patient states that [place] workers come denied her dates for after 09/29/2023-10/12/2023 in which Dr B wrote her out until 10/07/2023. Please advise the patient on what should be done and how to handle this. Best contact number [phone]

## 2023-08-31 NOTE — Therapy (Signed)
OUTPATIENT PHYSICAL THERAPY TREATMENT   Patient Name: Cindy Carson MRN: [id number] DOB:Jun 08, 1971, 52 y.o., female Today's Date: 08/31/2023  END OF SESSION:  PT End of Session - 08/31/23 0804     Visit Number 12    Number of Visits 25    Date for PT Re-Evaluation 10/03/23    Authorization Type UMR    Authorization Time Period 08/25/22 to 10/20/22    PT Start Time 0802    PT Stop Time 0847    PT Time Calculation (min) 45 min    Activity Tolerance Patient tolerated treatment well    Behavior During Therapy [facility] for tasks assessed/performed                   Past Medical History:  Diagnosis Date   Anemia 12/09/2018   Chronic low back pain    Chronic neck pain    Diabetes type 2, uncontrolled    diagnosed in 2019   Hyperlipidemia 08/01/2021   Hypertension complicating diabetes (HCC)    Neck pain 07/11/2021   Obesity 12/09/2018   OSA (obstructive sleep apnea)    sleep study done in [place] in 2019 per pt   Rash 01/28/2022   Screening for colon cancer 08/01/2021   Past Surgical History:  Procedure Laterality Date   COLONOSCOPY     COLONOSCOPY N/A 09/03/2021   Procedure: COLONOSCOPY;  Surgeon: Midge Minium, MD;  Location: [facility] ENDOSCOPY;  Service: Endoscopy;  Laterality: N/A;   SHOULDER ARTHROSCOPY WITH ROTATOR CUFF REPAIR Left 07/07/2023   Procedure: LEFT SHOULDER ARTHROSCOPY / DEBRIDEMENT WITH ROTATOR CUFF REPAIR;  Surgeon: Huel Cote, MD;  Location: [facility];  Service: Orthopedics;  Laterality: Left;   TUBAL LIGATION     Patient Active Problem List   Diagnosis Date Noted   Nontraumatic complete tear of left rotator cuff 07/07/2023   Type 2 diabetes mellitus with hyperglycemia, without long-term current use of insulin (HCC) 07/25/2022   Depression, recurrent (HCC) 07/25/2022   Encounter for screening for malignant neoplasm of breast 07/11/2021   Hyperlipidemia associated with type 2 diabetes mellitus (HCC) 07/11/2021   Personal history of  noncompliance with medical treatment, presenting hazards to health 02/15/2019   History of syphilis 12/09/2018   Bursitis of hip 12/09/2018   Anemia 12/09/2018   Obesity 12/09/2018   OSA (obstructive sleep apnea)    HTN (hypertension)    Hypertension complicating diabetes (HCC)    Chronic low back pain    Chronic neck pain      REFERRING PROVIDER:  Huel Cote, MD    REFERRING DIAG: [phone] (ICD-10-CM) - Nontraumatic complete tear of left rotator cuff  S/p Lt RCR  Rationale for Evaluation and Treatment: Rehabilitation  THERAPY DIAG:  Muscle weakness (generalized)  Acute pain of left shoulder  Stiffness of left shoulder, not elsewhere classified  ONSET DATE: DOS 07/07/23   Days since surgery: 55  SUBJECTIVE:  SUBJECTIVE STATEMENT: Pt arrives with sling stating "He told me to wear it when I'm out, but I don't wear it at home." Continues to have difficulty sleeping. "I don't feel ready to go back to work yet."   PERTINENT HISTORY:  DMT2, anemia, chronic neck pain  PAIN:  Are you having pain? No  PRECAUTIONS:  Shoulder  RED FLAGS: None   WEIGHT BEARING RESTRICTIONS:  Yes POSTOPERATIVE PLAN: She will follow the rotator cuff repair protocol.  She will be placed on aspirin for blood clot prevention.  She will be seen by physical therapy postop.  She will be seen at 2 weeks postop for suture removal  FALLS:  Has patient fallen in last 6 months? 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   PLOF:  Independent  PATIENT GOALS:  Be able to move it.    OBJECTIVE:  Note: Objective measures were completed at Evaluation unless otherwise noted.  PATIENT SURVEYS:  FOTO 13 FOTO:45% 12/9  COGNITIVE  STATUS: Within functional limits for tasks assessed   SENSATION: WFL  EDEMA:  No  POSTURE:  Eval: forward rounded shoulder as expected in sling  HAND DOMINANCE:  Right     Body Part #1 Shoulder  PALPATION: Eval: very guarded motion through shoulder  UPPER EXTREMITY ROM:  Passive ROM Right/Lt Eval 10/18   Shoulder flexion 20   Shoulder extension    Shoulder abduction 0   Shoulder adduction    Shoulder extension    Shoulder internal rotation    Shoulder external rotation -10   Elbow flexion full   Elbow extension -20    (Blank rows = not tested)      TREATMENT:                                                                                                                               12/9: -PROM all planes -well arm assist flexion supine x10 -supine active flexion (very light clinican assist) x5 -posterior shulder rolls x20 -scapular retraction x20 -Ball rolls at table (green physioball) x20 flexion, x20 scaption -pulleys flexion x44min, scaption x18min -Gentle IR behind back x10 -HEP update  12/4: -PROM all planes STM to deltoid, pec, bicep -well arm assist flexion supine x10 -AAROM with therapist assist flexion in supine-x6 (challenging) -posterior shulder rolls x10 -scapular retraction x15 -pendulums -Ball rolls at table (green physioball) x15 flexion, x10 scaption -pulleys flexionx70min    12/2 Manual: gentle PROM into flexion and ER; trigger point release to upper trap; gentle inferior and posterior glides   ER with cane 5x3  Press 3x5 with cuing   RTC Iso:  All 5x 5 sec hold   ER IR  Flex    Reviewed an updated HEP    11/20 Manual: gentle PROM into flexion and ER; trigger point release to upper trap; gentle inferior and posterior glides   Began AAROM  ER with cane 5x3  Press 3x3 with cuing   Review of how to properly wean out of the sling  Scap retraction 2x10   Updated and reviewed HEP   PATIENT EDUCATION:   Education details: Anatomy of condition, POC, HEP, exercise form/rationale Person educated: Patient Education method: Explanation, Demonstration, Tactile cues, Verbal cues, and Handouts Education comprehension: verbalized understanding, returned demonstration, verbal cues required, tactile cues required, and needs further education  HOME EXERCISE PROGRAM: [id number]   ASSESSMENT:  CLINICAL IMPRESSION: Pt has increased FOTO score of 45% today. Pt remains limited in end range movement and does guard against passive movement. This does appear improved from previous sessions however. Able to initiate active supine ROM ,though challenging. Will continue to progress towards independent functional use of L UE and eventual RTW. Updated HEP to reflect progressions.   REHAB POTENTIAL: Good  CLINICAL DECISION MAKING: Stable/uncomplicated  EVALUATION COMPLEXITY: Low   GOALS: Goals reviewed with patient? Yes  SHORT TERM GOALS: Target date: 4 wks (08/04/23)  Passive flexion to 140 deg without increase in pain Baseline: Goal status:90 degrees 11/11  2.  Passive abd to 60 deg without increase in pain Baseline:  Goal status: 60 achieved 11/11  3.  Passive ER at side to 40 deg without increase in pain Baseline:  Goal status 18 11/11   4.  Able to demo proper scapular retraction for proximal shoulder girdle support Baseline:  Goal status: progressing 12/2   LONG TERM GOALS: Target date: POC DATE  Able to demo AROM to shoulder height, against gravity, with good scapular control Baseline:  Goal status: INITIAL  Target date:09/04/23 8 weeks has not begn active motion 12/2   2.  Will begin light resistance exercises pain <=3/10 Baseline:  Goal status: INITIAL Target date:  8 weeks  3.  Full AROM within 10 deg of opp UE without increase in pain Baseline:  Goal status: INITIAL Target date: 10/02/23 12 weeks  4.  Proper scapular control in proprioceptive and CKC  strengthening Baseline:  Goal status: INITIAL Target date: 10/03/23  12 weeks  5.  Able to lift cups/plates and other small objects into overhead cabinets without limitation by shoulder Baseline:  Goal status: INITIAL Target date: 10/03/23  12 weeks    PLAN:  PT FREQUENCY: 1-2x/week  PT DURATION: 12 weeks  PLANNED INTERVENTIONS: 97164- PT Re-evaluation, 97110-Therapeutic exercises, 97530- Therapeutic activity, 97112- Neuromuscular re-education, 97535- Self Care, 08657- Manual therapy, [id number]- Aquatic Therapy, 97014- Electrical stimulation (unattended), [phone]- Traction (mechanical), Patient/Family education, Taping, Dry Needling, Joint mobilization, Spinal mobilization, Scar mobilization, Cryotherapy, and Moist heat.  PLAN FOR NEXT SESSION: continue PROM per protocol  Riki Altes, PTA   08/31/23 9:00 AM

## 2023-09-01 ENCOUNTER — Telehealth: Payer: Self-pay | Admitting: Orthopaedic Surgery

## 2023-09-01 NOTE — Telephone Encounter (Signed)
[employer] forms received. To [employer]. 

## 2023-09-01 NOTE — Telephone Encounter (Signed)
I called and talked to the pt. She stated her boss said she is out until her next appt but her FMLA is saying she is to go back on 1/6. Tammy do you mind checking into this for me?

## 2023-09-01 NOTE — Telephone Encounter (Signed)
IC,lmvm advised [employer] form will be updated to reflect oow through 10/06/22 and faxed the updated form and oow note [phone].

## 2023-09-02 ENCOUNTER — Ambulatory Visit (HOSPITAL_BASED_OUTPATIENT_CLINIC_OR_DEPARTMENT_OTHER): Payer: 59 | Admitting: Physical Therapy

## 2023-09-02 DIAGNOSIS — M25612 Stiffness of left shoulder, not elsewhere classified: Secondary | ICD-10-CM

## 2023-09-02 DIAGNOSIS — M25512 Pain in left shoulder: Secondary | ICD-10-CM | POA: Diagnosis not present

## 2023-09-02 DIAGNOSIS — G8929 Other chronic pain: Secondary | ICD-10-CM | POA: Diagnosis not present

## 2023-09-02 DIAGNOSIS — M6281 Muscle weakness (generalized): Secondary | ICD-10-CM

## 2023-09-02 NOTE — Therapy (Signed)
OUTPATIENT PHYSICAL THERAPY TREATMENT   Patient Name: Cindy Carson MRN: [id number] DOB:02-05-1971, 52 y.o., female Today's Date: 09/02/2023  END OF SESSION:          Past Medical History:  Diagnosis Date   Anemia 12/09/2018   Chronic low back pain    Chronic neck pain    Diabetes type 2, uncontrolled    diagnosed in 2019   Hyperlipidemia 08/01/2021   Hypertension complicating diabetes (HCC)    Neck pain 07/11/2021   Obesity 12/09/2018   OSA (obstructive sleep apnea)    sleep study done in [place] in 2019 per pt   Rash 01/28/2022   Screening for colon cancer 08/01/2021   Past Surgical History:  Procedure Laterality Date   COLONOSCOPY     COLONOSCOPY N/A 09/03/2021   Procedure: COLONOSCOPY;  Surgeon: Midge Minium, MD;  Location: [facility] ENDOSCOPY;  Service: Endoscopy;  Laterality: N/A;   SHOULDER ARTHROSCOPY WITH ROTATOR CUFF REPAIR Left 07/07/2023   Procedure: LEFT SHOULDER ARTHROSCOPY / DEBRIDEMENT WITH ROTATOR CUFF REPAIR;  Surgeon: Huel Cote, MD;  Location: [facility];  Service: Orthopedics;  Laterality: Left;   TUBAL LIGATION     Patient Active Problem List   Diagnosis Date Noted   Nontraumatic complete tear of left rotator cuff 07/07/2023   Type 2 diabetes mellitus with hyperglycemia, without long-term current use of insulin (HCC) 07/25/2022   Depression, recurrent (HCC) 07/25/2022   Encounter for screening for malignant neoplasm of breast 07/11/2021   Hyperlipidemia associated with type 2 diabetes mellitus (HCC) 07/11/2021   Personal history of noncompliance with medical treatment, presenting hazards to health 02/15/2019   History of syphilis 12/09/2018   Bursitis of hip 12/09/2018   Anemia 12/09/2018   Obesity 12/09/2018   OSA (obstructive sleep apnea)    HTN (hypertension)    Hypertension complicating diabetes (HCC)    Chronic low back pain    Chronic neck pain      REFERRING PROVIDER:  Huel Cote, MD    REFERRING DIAG:  [id number] (ICD-10-CM) - Nontraumatic complete tear of left rotator cuff  S/p Lt RCR  Rationale for Evaluation and Treatment: Rehabilitation  THERAPY DIAG:  No diagnosis found.  ONSET DATE: DOS 07/07/23   Days since surgery: 57  SUBJECTIVE:                                                                                                                                                                                           SUBJECTIVE STATEMENT: The patient thinks she over did it. She did a lot of cleaning yesterday.   PERTINENT HISTORY:  DMT2, anemia, chronic neck pain  PAIN:  Are you having pain? No  PRECAUTIONS:  Shoulder  RED FLAGS: None   WEIGHT BEARING RESTRICTIONS:  Yes POSTOPERATIVE PLAN: She will follow the rotator cuff repair protocol.  She will be placed on aspirin for blood clot prevention.  She will be seen by physical therapy postop.  She will be seen at 2 weeks postop for suture removal  FALLS:  Has patient fallen in last 6 months? 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   PLOF:  Independent  PATIENT GOALS:  Be able to move it.    OBJECTIVE:  Note: Objective measures were completed at Evaluation unless otherwise noted.  PATIENT SURVEYS:  FOTO 13 FOTO:45% 12/9  COGNITIVE STATUS: Within functional limits for tasks assessed   SENSATION: WFL  EDEMA:  No  POSTURE:  Eval: forward rounded shoulder as expected in sling  HAND DOMINANCE:  Right     Body Part #1 Shoulder  PALPATION: Eval: very guarded motion through shoulder  UPPER EXTREMITY ROM:  Passive ROM Right/Lt Eval 10/18   Shoulder flexion 20   Shoulder extension    Shoulder abduction 0   Shoulder adduction    Shoulder extension    Shoulder internal rotation    Shoulder external rotation -10   Elbow flexion full   Elbow extension -20    (Blank rows = not tested)      TREATMENT:                                                                                                                               12/11 Manual: gentle PROM into flexion and ER; trigger point release to upper trap; gentle inferior and posterior glides  Supine: AROM ER 3x10  Wand press 3x10 had some difficulty     12/9: -PROM all planes -well arm assist flexion supine x10 -supine active flexion (very light clinican assist) x5 -posterior shulder rolls x20 -scapular retraction x20 -Ball rolls at table (green physioball) x20 flexion, x20 scaption -pulleys flexion x100min, scaption x57min -Gentle IR behind back x10 -HEP update  12/4: -PROM all planes STM to deltoid, pec, bicep -well arm assist flexion supine x10 -AAROM with therapist assist flexion in supine-x6 (challenging) -posterior shulder rolls x10 -scapular retraction x15 -pendulums -Ball rolls at table (green physioball) x15 flexion, x10 scaption -pulleys flexionx12min    12/2 Manual: gentle PROM into flexion and ER; trigger point release to upper trap; gentle inferior and posterior glides   ER with cane 5x3  Press 3x5 with cuing   RTC Iso:  All 5x 5 sec hold   ER IR  Flex    Reviewed an updated HEP    11/20 Manual: gentle PROM into flexion and ER; trigger point release to upper trap; gentle inferior and posterior glides   Began AAROM   ER with cane 5x3  Press 3x3 with cuing   Review of  how to properly wean out of the sling  Scap retraction 2x10   Updated and reviewed HEP   PATIENT EDUCATION:  Education details: Anatomy of condition, POC, HEP, exercise form/rationale Person educated: Patient Education method: Explanation, Demonstration, Tactile cues, Verbal cues, and Handouts Education comprehension: verbalized understanding, returned demonstration, verbal cues required, tactile cues required, and needs further education  HOME EXERCISE PROGRAM: [email or web address]   ASSESSMENT:  CLINICAL IMPRESSION: Patient was limited by  pain today. She reports she is using it a lot at home. She was advised to use ift less for household things and focus more on the exercises. She is still having difficulty with basic AAROM exercises. She appears to have biceps tendinitis at this time. We reviewed self TFM to the tendon. She was also advised to ice it. She is alos sleeping on that side. Her motiion into ER is improving. Her IR is still limited.    REHAB POTENTIAL: Good  CLINICAL DECISION MAKING: Stable/uncomplicated  EVALUATION COMPLEXITY: Low   GOALS: Goals reviewed with patient? Yes  SHORT TERM GOALS: Target date: 4 wks (08/04/23)  Passive flexion to 140 deg without increase in pain Baseline: Goal status:90 degrees 11/11  2.  Passive abd to 60 deg without increase in pain Baseline:  Goal status: 60 achieved 11/11  3.  Passive ER at side to 40 deg without increase in pain Baseline:  Goal status 18 11/11   4.  Able to demo proper scapular retraction for proximal shoulder girdle support Baseline:  Goal status: progressing 12/2   LONG TERM GOALS: Target date: POC DATE  Able to demo AROM to shoulder height, against gravity, with good scapular control Baseline:  Goal status: INITIAL  Target date:09/04/23 8 weeks has not begn active motion 12/2   2.  Will begin light resistance exercises pain <=3/10 Baseline:  Goal status: INITIAL Target date:  8 weeks  3.  Full AROM within 10 deg of opp UE without increase in pain Baseline:  Goal status: INITIAL Target date: 10/02/23 12 weeks  4.  Proper scapular control in proprioceptive and CKC strengthening Baseline:  Goal status: INITIAL Target date: 10/03/23  12 weeks  5.  Able to lift cups/plates and other small objects into overhead cabinets without limitation by shoulder Baseline:  Goal status: INITIAL Target date: 10/03/23  12 weeks    PLAN:  PT FREQUENCY: 1-2x/week  PT DURATION: 12 weeks  PLANNED INTERVENTIONS: 97164- PT Re-evaluation,  97110-Therapeutic exercises, 97530- Therapeutic activity, 97112- Neuromuscular re-education, 97535- Self Care, 96295- Manual therapy, [id number]- Aquatic Therapy, 97014- Electrical stimulation (unattended), [phone]- Traction (mechanical), Patient/Family education, Taping, Dry Needling, Joint mobilization, Spinal mobilization, Scar mobilization, Cryotherapy, and Moist heat.  PLAN FOR NEXT SESSION: continue PROM per protocol   Lorayne Bender PT DPT   09/02/23 8:04 AM

## 2023-09-07 ENCOUNTER — Ambulatory Visit (HOSPITAL_BASED_OUTPATIENT_CLINIC_OR_DEPARTMENT_OTHER): Payer: 59

## 2023-09-07 ENCOUNTER — Encounter (HOSPITAL_BASED_OUTPATIENT_CLINIC_OR_DEPARTMENT_OTHER): Payer: Self-pay

## 2023-09-07 DIAGNOSIS — M25612 Stiffness of left shoulder, not elsewhere classified: Secondary | ICD-10-CM

## 2023-09-07 DIAGNOSIS — M6281 Muscle weakness (generalized): Secondary | ICD-10-CM

## 2023-09-07 DIAGNOSIS — G8929 Other chronic pain: Secondary | ICD-10-CM | POA: Diagnosis not present

## 2023-09-07 DIAGNOSIS — M25512 Pain in left shoulder: Secondary | ICD-10-CM | POA: Diagnosis not present

## 2023-09-07 NOTE — Therapy (Signed)
OUTPATIENT PHYSICAL THERAPY TREATMENT   Patient Name: Cindy Carson MRN: [id number] DOB:[birth year], 52 y.o., female Today's Date: 09/07/2023  END OF SESSION:  PT End of Session - 09/07/23 0831     Visit Number 14    Number of Visits 25    Date for PT Re-Evaluation 10/03/23    Authorization Type UMR    Authorization Time Period 08/25/22 to 10/20/22    PT Start Time 0809    PT Stop Time 0844    PT Time Calculation (min) 35 min    Activity Tolerance Patient tolerated treatment well    Behavior During Therapy [facility] for tasks assessed/performed                    Past Medical History:  Diagnosis Date   Anemia 12/09/2018   Chronic low back pain    Chronic neck pain    Diabetes type 2, uncontrolled    diagnosed in 2019   Hyperlipidemia 08/01/2021   Hypertension complicating diabetes (HCC)    Neck pain 07/11/2021   Obesity 12/09/2018   OSA (obstructive sleep apnea)    sleep study done in [place] in 2019 per pt   Rash 01/28/2022   Screening for colon cancer 08/01/2021   Past Surgical History:  Procedure Laterality Date   COLONOSCOPY     COLONOSCOPY N/A 09/03/2021   Procedure: COLONOSCOPY;  Surgeon: Midge Minium, MD;  Location: [facility] ENDOSCOPY;  Service: Endoscopy;  Laterality: N/A;   SHOULDER ARTHROSCOPY WITH ROTATOR CUFF REPAIR Left 07/07/2023   Procedure: LEFT SHOULDER ARTHROSCOPY / DEBRIDEMENT WITH ROTATOR CUFF REPAIR;  Surgeon: Huel Cote, MD;  Location: [facility];  Service: Orthopedics;  Laterality: Left;   TUBAL LIGATION     Patient Active Problem List   Diagnosis Date Noted   Nontraumatic complete tear of left rotator cuff 07/07/2023   Type 2 diabetes mellitus with hyperglycemia, without long-term current use of insulin (HCC) 07/25/2022   Depression, recurrent (HCC) 07/25/2022   Encounter for screening for malignant neoplasm of breast 07/11/2021   Hyperlipidemia associated with type 2 diabetes mellitus (HCC) 07/11/2021   Personal history  of noncompliance with medical treatment, presenting hazards to health 02/15/2019   History of syphilis 12/09/2018   Bursitis of hip 12/09/2018   Anemia 12/09/2018   Obesity 12/09/2018   OSA (obstructive sleep apnea)    HTN (hypertension)    Hypertension complicating diabetes (HCC)    Chronic low back pain    Chronic neck pain      REFERRING PROVIDER:  Huel Cote, MD    REFERRING DIAG: [phone] (ICD-10-CM) - Nontraumatic complete tear of left rotator cuff  S/p Lt RCR  Rationale for Evaluation and Treatment: Rehabilitation  THERAPY DIAG:  Muscle weakness (generalized)  Acute pain of left shoulder  Stiffness of left shoulder, not elsewhere classified  ONSET DATE: DOS 07/07/23   Days since surgery: 62  SUBJECTIVE:  SUBJECTIVE STATEMENT: Pt reports 2-3/10 pain level at entry. "It's not too bad. I rested all weekend."  PERTINENT HISTORY:  DMT2, anemia, chronic neck pain  PAIN:  Are you having pain? No  PRECAUTIONS:  Shoulder  RED FLAGS: None   WEIGHT BEARING RESTRICTIONS:  Yes POSTOPERATIVE PLAN: She will follow the rotator cuff repair protocol.  She will be placed on aspirin for blood clot prevention.  She will be seen by physical therapy postop.  She will be seen at 2 weeks postop for suture removal  FALLS:  Has patient fallen in last 6 months? 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   PLOF:  Independent  PATIENT GOALS:  Be able to move it.    OBJECTIVE:  Note: Objective measures were completed at Evaluation unless otherwise noted.  PATIENT SURVEYS:  FOTO 13 FOTO:45% 12/9  COGNITIVE STATUS: Within functional limits for tasks assessed   SENSATION: WFL  EDEMA:  No  POSTURE:  Eval:  forward rounded shoulder as expected in sling  HAND DOMINANCE:  Right     Body Part #1 Shoulder  PALPATION: Eval: very guarded motion through shoulder  UPPER EXTREMITY ROM:  Passive ROM Right/Lt Eval 10/18   Shoulder flexion 20   Shoulder extension    Shoulder abduction 0   Shoulder adduction    Shoulder extension    Shoulder internal rotation    Shoulder external rotation -10   Elbow flexion full   Elbow extension -20    (Blank rows = not tested)      TREATMENT:                                                                                                                               12/16 -PROM all planes -STM to biceps/pec -well arm assist flexion supine x10 -supine active flexion 2x5 -Sidelying active abduction 2x5 -posterior shulder rolls x20 -seated bil ER x10 -Gentle IR behind back x10, 5seconds -finger ladder flexion x5   12/11 Manual: gentle PROM into flexion and ER; trigger point release to upper trap; gentle inferior and posterior glides  Supine: AROM ER 3x10  Wand press 3x10 had some difficulty     12/9: -PROM all planes -well arm assist flexion supine x10 -supine active flexion (very light clinican assist) x5 -posterior shulder rolls x20 -scapular retraction x20 -Ball rolls at table (green physioball) x20 flexion, x20 scaption -pulleys flexion x25min, scaption x60min -Gentle IR behind back x10 -HEP update  12/4: -PROM all planes STM to deltoid, pec, bicep -well arm assist flexion supine x10 -AAROM with therapist assist flexion in supine-x6 (challenging) -posterior shulder rolls x10 -scapular retraction x15 -pendulums -Ball rolls at table (green physioball) x15 flexion, x10 scaption -pulleys flexionx15min    12/2 Manual: gentle PROM into flexion and ER; trigger point release to upper trap; gentle inferior and posterior glides   ER with cane 5x3  Press 3x5 with cuing   RTC Iso:  All 5x 5 sec hold   ER IR  Flex     Reviewed an updated HEP     PATIENT EDUCATION:  Education details: Anatomy of condition, POC, HEP, exercise form/rationale Person educated: Patient Education method: Explanation, Demonstration, Tactile cues, Verbal cues, and Handouts Education comprehension: verbalized understanding, returned demonstration, verbal cues required, tactile cues required, and needs further education  HOME EXERCISE PROGRAM: [email or web address]   ASSESSMENT:  CLINICAL IMPRESSION: Pt with improved tolerance for AAROM/AROM today. Remains tender to palpation of biceps. Limited in end range passive movement each plane, though does tend to muscle guard. Again emphasized importance of modifying activities at home to prevent inflammation.   REHAB POTENTIAL: Good  CLINICAL DECISION MAKING: Stable/uncomplicated  EVALUATION COMPLEXITY: Low   GOALS: Goals reviewed with patient? Yes  SHORT TERM GOALS: Target date: 4 wks (08/04/23)  Passive flexion to 140 deg without increase in pain Baseline: Goal status:90 degrees 11/11  2.  Passive abd to 60 deg without increase in pain Baseline:  Goal status: 60 achieved 11/11  3.  Passive ER at side to 40 deg without increase in pain Baseline:  Goal status 18 11/11   4.  Able to demo proper scapular retraction for proximal shoulder girdle support Baseline:  Goal status: progressing 12/2   LONG TERM GOALS: Target date: POC DATE  Able to demo AROM to shoulder height, against gravity, with good scapular control Baseline:  Goal status: INITIAL  Target date:09/04/23 8 weeks has not begn active motion 12/2   2.  Will begin light resistance exercises pain <=3/10 Baseline:  Goal status: INITIAL Target date:  8 weeks  3.  Full AROM within 10 deg of opp UE without increase in pain Baseline:  Goal status: INITIAL Target date: 10/02/23 12 weeks  4.  Proper scapular control in proprioceptive and CKC strengthening Baseline:  Goal status: INITIAL Target date: 10/03/23   12 weeks  5.  Able to lift cups/plates and other small objects into overhead cabinets without limitation by shoulder Baseline:  Goal status: INITIAL Target date: 10/03/23  12 weeks    PLAN:  PT FREQUENCY: 1-2x/week  PT DURATION: 12 weeks  PLANNED INTERVENTIONS: 97164- PT Re-evaluation, 97110-Therapeutic exercises, 97530- Therapeutic activity, 97112- Neuromuscular re-education, 97535- Self Care, 46962- Manual therapy, [id number]- Aquatic Therapy, 97014- Electrical stimulation (unattended), [phone]- Traction (mechanical), Patient/Family education, Taping, Dry Needling, Joint mobilization, Spinal mobilization, Scar mobilization, Cryotherapy, and Moist heat.  PLAN FOR NEXT SESSION: continue PROM per protocol  Riki Altes, PTA    09/07/23 9:04 AM

## 2023-09-09 ENCOUNTER — Encounter (HOSPITAL_BASED_OUTPATIENT_CLINIC_OR_DEPARTMENT_OTHER): Payer: Self-pay | Admitting: Physical Therapy

## 2023-09-09 ENCOUNTER — Ambulatory Visit (HOSPITAL_BASED_OUTPATIENT_CLINIC_OR_DEPARTMENT_OTHER): Payer: 59 | Admitting: Physical Therapy

## 2023-09-09 DIAGNOSIS — M6281 Muscle weakness (generalized): Secondary | ICD-10-CM

## 2023-09-09 DIAGNOSIS — M25612 Stiffness of left shoulder, not elsewhere classified: Secondary | ICD-10-CM

## 2023-09-09 DIAGNOSIS — M25512 Pain in left shoulder: Secondary | ICD-10-CM

## 2023-09-09 DIAGNOSIS — G8929 Other chronic pain: Secondary | ICD-10-CM | POA: Diagnosis not present

## 2023-09-09 NOTE — Therapy (Signed)
OUTPATIENT PHYSICAL THERAPY TREATMENT   Patient Name: Cindy Carson MRN: [id number] DOB:03-30-1971, 52 y.o., female Today's Date: 09/09/2023  END OF SESSION:  PT End of Session - 09/09/23 0805     Visit Number 15    Number of Visits 25    Date for PT Re-Evaluation 10/03/23    Authorization Type UMR    Authorization Time Period 08/25/22 to 10/20/22    PT Start Time 0803    PT Stop Time 0844    PT Time Calculation (min) 41 min    Activity Tolerance Patient tolerated treatment well    Behavior During Therapy [facility] for tasks assessed/performed                    Past Medical History:  Diagnosis Date   Anemia 12/09/2018   Chronic low back pain    Chronic neck pain    Diabetes type 2, uncontrolled    diagnosed in 2019   Hyperlipidemia 08/01/2021   Hypertension complicating diabetes (HCC)    Neck pain 07/11/2021   Obesity 12/09/2018   OSA (obstructive sleep apnea)    sleep study done in [place] in 2019 per pt   Rash 01/28/2022   Screening for colon cancer 08/01/2021   Past Surgical History:  Procedure Laterality Date   COLONOSCOPY     COLONOSCOPY N/A 09/03/2021   Procedure: COLONOSCOPY;  Surgeon: Midge Minium, MD;  Location: [facility] ENDOSCOPY;  Service: Endoscopy;  Laterality: N/A;   SHOULDER ARTHROSCOPY WITH ROTATOR CUFF REPAIR Left 07/07/2023   Procedure: LEFT SHOULDER ARTHROSCOPY / DEBRIDEMENT WITH ROTATOR CUFF REPAIR;  Surgeon: Huel Cote, MD;  Location: [facility];  Service: Orthopedics;  Laterality: Left;   TUBAL LIGATION     Patient Active Problem List   Diagnosis Date Noted   Nontraumatic complete tear of left rotator cuff 07/07/2023   Type 2 diabetes mellitus with hyperglycemia, without long-term current use of insulin (HCC) 07/25/2022   Depression, recurrent (HCC) 07/25/2022   Encounter for screening for malignant neoplasm of breast 07/11/2021   Hyperlipidemia associated with type 2 diabetes mellitus (HCC) 07/11/2021   Personal history  of noncompliance with medical treatment, presenting hazards to health 02/15/2019   History of syphilis 12/09/2018   Bursitis of hip 12/09/2018   Anemia 12/09/2018   Obesity 12/09/2018   OSA (obstructive sleep apnea)    HTN (hypertension)    Hypertension complicating diabetes (HCC)    Chronic low back pain    Chronic neck pain      REFERRING PROVIDER:  Huel Cote, MD    REFERRING DIAG: [phone] (ICD-10-CM) - Nontraumatic complete tear of left rotator cuff  S/p Lt RCR  Rationale for Evaluation and Treatment: Rehabilitation  THERAPY DIAG:  No diagnosis found.  ONSET DATE: DOS 07/07/23   Days since surgery: 64  SUBJECTIVE:  SUBJECTIVE STATEMENT: The patien treports the last session went well. She is not having pain this morning.   PERTINENT HISTORY:  DMT2, anemia, chronic neck pain  PAIN:  Are you having pain? No  PRECAUTIONS:  Shoulder  RED FLAGS: None   WEIGHT BEARING RESTRICTIONS:  Yes POSTOPERATIVE PLAN: She will follow the rotator cuff repair protocol.  She will be placed on aspirin for blood clot prevention.  She will be seen by physical therapy postop.  She will be seen at 2 weeks postop for suture removal  FALLS:  Has patient fallen in last 6 months? 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   PLOF:  Independent  PATIENT GOALS:  Be able to move it.    OBJECTIVE:  Note: Objective measures were completed at Evaluation unless otherwise noted.  PATIENT SURVEYS:  FOTO 13 FOTO:45% 12/9  COGNITIVE STATUS: Within functional limits for tasks assessed   SENSATION: WFL  EDEMA:  No  POSTURE:  Eval: forward rounded shoulder as expected in sling  HAND DOMINANCE:  Right     Body Part #1  Shoulder  PALPATION: Eval: very guarded motion through shoulder  UPPER EXTREMITY ROM:  Passive ROM Right/Lt Eval 10/18   Shoulder flexion 20   Shoulder extension    Shoulder abduction 0   Shoulder adduction    Shoulder extension    Shoulder internal rotation    Shoulder external rotation -10   Elbow flexion full   Elbow extension -20    (Blank rows = not tested)      TREATMENT:                                                                                                                              12/18 Manual: gentle PROM into flexion and ER; trigger point release to upper trap; gentle inferior and posterior glides  Supine:  Cane press 2x10  Cane flexion 2x10   Side lying :  ER 2x10  Abduction 2x10   Standing band:     12/16 -PROM all planes -STM to biceps/pec -well arm assist flexion supine x10 -supine active flexion 2x5 -Sidelying active abduction 2x5 -posterior shulder rolls x20 -seated bil ER x10 -Gentle IR behind back x10, 5seconds -finger ladder flexion x5   12/11 Manual: gentle PROM into flexion and ER; trigger point release to upper trap; gentle inferior and posterior glides  Supine: AROM ER 3x10  Wand press 3x10 had some difficulty     12/9: -PROM all planes -well arm assist flexion supine x10 -supine active flexion (very light clinican assist) x5 -posterior shulder rolls x20 -scapular retraction x20 -Ball rolls at table (green physioball) x20 flexion, x20 scaption -pulleys flexion x39min, scaption x53min -Gentle IR behind back x10 -HEP update  12/4: -PROM all planes STM to deltoid, pec, bicep -well arm assist flexion supine x10 -AAROM with therapist assist flexion in supine-x6 (challenging) -  posterior shulder rolls x10 -scapular retraction x15 -pendulums -Ball rolls at table (green physioball) x15 flexion, x10 scaption -pulleys flexionx75min    12/2 Manual: gentle PROM into flexion and ER; trigger point release to upper  trap; gentle inferior and posterior glides   ER with cane 5x3  Press 3x5 with cuing   RTC Iso:  All 5x 5 sec hold   ER IR  Flex    Reviewed an updated HEP     PATIENT EDUCATION:  Education details: Teacher, music of condition, POC, HEP, exercise form/rationale Person educated: Patient Education method: Explanation, Demonstration, Tactile cues, Verbal cues, and Handouts Education comprehension: verbalized understanding, returned demonstration, verbal cues required, tactile cues required, and needs further education  HOME EXERCISE PROGRAM: [id number]   ASSESSMENT:  CLINICAL IMPRESSION:  The patient continues to make excellent progress. Her passive ER was measured at 52 degrees. Her passive flexion is earing end range. We added light reistance to her scapular exercises. She tends to have minor pain in the front of her shoulder with flexion and rows. She will monitor and adjust the reps. We gave her an updated HEP. See below for goal specific progress    REHAB POTENTIAL: Good  CLINICAL DECISION MAKING: Stable/uncomplicated  EVALUATION COMPLEXITY: Low   GOALS: Goals reviewed with patient? Yes  SHORT TERM GOALS: Target date: 4 wks (08/04/23)  Passive flexion to 140 deg without increase in pain Baseline: Goal status:90 degrees 11/11  2.  Passive abd to 60 deg without increase in pain Baseline:  Goal status: 60 achieved 11/11  3.  Passive ER at side to 40 deg without increase in pain Baseline:  Goal status 18 11/11   4.  Able to demo proper scapular retraction for proximal shoulder girdle support Baseline:  Goal status: progressing 12/2   LONG TERM GOALS: Target date: POC DATE  Able to demo AROM to shoulder height, against gravity, with good scapular control Baseline:  Goal status: INITIAL  Target date:09/04/23 8 weeks has not begn active motion 12/2   2.  Will begin light resistance exercises pain <=3/10 Baseline:  Goal status: Initiated today with minor pain  12/18  Target date:  8 weeks  3.  Full AROM within 10 deg of opp UE without increase in pain Baseline:  Goal status: INITIAL Target date: 10/02/23 12 weeks  4.  Proper scapular control in proprioceptive and CKC strengthening Baseline:  Goal status: INITIAL Target date: 10/03/23  12 weeks  5.  Able to lift cups/plates and other small objects into overhead cabinets without limitation by shoulder Baseline:  Goal status: INITIAL Target date: 10/03/23  12 weeks    PLAN:  PT FREQUENCY: 1-2x/week  PT DURATION: 12 weeks  PLANNED INTERVENTIONS: 97164- PT Re-evaluation, 97110-Therapeutic exercises, 97530- Therapeutic activity, 97112- Neuromuscular re-education, 97535- Self Care, 44010- Manual therapy, [id number]- Aquatic Therapy, 97014- Electrical stimulation (unattended), [phone]- Traction (mechanical), Patient/Family education, Taping, Dry Needling, Joint mobilization, Spinal mobilization, Scar mobilization, Cryotherapy, and Moist heat.  PLAN FOR NEXT SESSION: continue PROM per protocol  Riki Altes, PTA    09/09/23 8:06 AM

## 2023-09-10 ENCOUNTER — Encounter: Payer: Self-pay | Admitting: Nurse Practitioner

## 2023-09-10 ENCOUNTER — Telehealth: Payer: 59 | Admitting: Nurse Practitioner

## 2023-09-10 ENCOUNTER — Other Ambulatory Visit (HOSPITAL_BASED_OUTPATIENT_CLINIC_OR_DEPARTMENT_OTHER): Payer: Self-pay

## 2023-09-10 VITALS — Ht 62.0 in | Wt 180.0 lb

## 2023-09-10 DIAGNOSIS — E1165 Type 2 diabetes mellitus with hyperglycemia: Secondary | ICD-10-CM | POA: Diagnosis not present

## 2023-09-10 DIAGNOSIS — Z7985 Long-term (current) use of injectable non-insulin antidiabetic drugs: Secondary | ICD-10-CM

## 2023-09-10 MED ORDER — SEMAGLUTIDE (1 MG/DOSE) 4 MG/3ML ~~LOC~~ SOPN
1.0000 mg | PEN_INJECTOR | SUBCUTANEOUS | 1 refills | Status: DC
Start: 2023-09-10 — End: 2023-11-11
  Filled 2023-09-10: qty 3, 28d supply, fill #0
  Filled 2023-10-31: qty 3, 28d supply, fill #1

## 2023-09-10 NOTE — Progress Notes (Signed)
Ht 5\' 2"  (1.575 m)   Wt 180 lb (81.6 kg)   LMP 03/09/2019   BMI 32.92 kg/m    Subjective:    Patient ID: Cindy Carson, female    DOB: 1971/06/15, 52 y.o.   MRN: [id number]  HPI: Cindy Carson is a 52 y.o. female  Chief Complaint  Patient presents with   1 MONTH FOLLOW UP    No concerns today   DIABETES Patient states she has been having some blurred vision.  States her sugars have been under 150.  Usually around 120-130.  She is having some blurred vision for the last week.  She is doing 10-13u of the Semglee.  She is on the Glipizide and she is only taking Ozempic 0.5mg  weekly.    Denies headaches, polyuria and polydipsia.    Relevant past medical, surgical, family and social history reviewed and updated as indicated. Interim medical history since our last visit reviewed. Allergies and medications reviewed and updated.  Review of Systems  Eyes:        Blurred vision  Endocrine: Negative for polydipsia and polyuria.    Per HPI unless specifically indicated above     Objective:    Ht 5\' 2"  (1.575 m)   Wt 180 lb (81.6 kg)   LMP 03/09/2019   BMI 32.92 kg/m   Wt Readings from Last 3 Encounters:  09/10/23 180 lb (81.6 kg)  08/12/23 189 lb 3.2 oz (85.8 kg)  07/07/23 183 lb 3.2 oz (83.1 kg)    Physical Exam Vitals and nursing note reviewed.  Constitutional:      General: She is not in acute distress.    Appearance: She is not ill-appearing.  HENT:     Head: Normocephalic.     Right Ear: Hearing normal.     Left Ear: Hearing normal.     Nose: Nose normal.  Pulmonary:     Effort: Pulmonary effort is normal. No respiratory distress.  Neurological:     Mental Status: She is alert.  Psychiatric:        Mood and Affect: Mood normal.        Behavior: Behavior normal.        Thought Content: Thought content normal.        Judgment: Judgment normal.     Results for orders placed or performed in visit on 08/12/23  Bayer DCA Hb A1c Waived (STAT)   Collection Time:  08/12/23  9:55 AM  Result Value Ref Range   HB A1C (BAYER DCA - WAIVED) 11.3 (H) 4.8 - 5.6 %      Assessment & Plan:   Problem List Items Addressed This Visit       Endocrine   Type 2 diabetes mellitus with hyperglycemia, without long-term current use of insulin (HCC) - Primary   Chronic.  Not well controlled.  A1c did improve at last visit to 11.3%.  Continue with Glipidize and Semglee.  Does not tolerate metformin.  Increased Ozempic to 1mg .  Follow up in 2 months.  Call sooner if concerns arise.       Relevant Medications   Semaglutide, 1 MG/DOSE, 4 MG/3ML SOPN       Follow up plan: Return in about 2 months (around 11/11/2023) for HTN, HLD, DM2 FU.   This visit was completed via MyChart due to the restrictions of the COVID-19 pandemic. All issues as above were discussed and addressed. Physical exam was done as above through visual confirmation on MyChart. If it was  felt that the patient should be evaluated in the office, they were directed there. The patient verbally consented to this visit. Location of the patient: Home Location of the provider: Office Those involved with this call:  Provider: Larae Grooms, NP CMA: Oswaldo Conroy, CMA Front Desk/Registration: Servando Snare This encounter was conducted via video.  I spent 30 dedicated to the care of this patient on the date of this encounter to include previsit review of symptoms, plan of care and follow up, face to face time with the patient, and post visit ordering of testing.

## 2023-09-10 NOTE — Assessment & Plan Note (Signed)
Chronic.  Not well controlled.  A1c did improve at last visit to 11.3%.  Continue with Glipidize and Semglee.  Does not tolerate metformin.  Increased Ozempic to 1mg .  Follow up in 2 months.  Call sooner if concerns arise.

## 2023-09-10 NOTE — Progress Notes (Signed)
Appointment has been made

## 2023-09-14 ENCOUNTER — Ambulatory Visit (HOSPITAL_BASED_OUTPATIENT_CLINIC_OR_DEPARTMENT_OTHER): Payer: 59

## 2023-09-14 ENCOUNTER — Encounter (HOSPITAL_BASED_OUTPATIENT_CLINIC_OR_DEPARTMENT_OTHER): Payer: Self-pay

## 2023-09-14 DIAGNOSIS — M6281 Muscle weakness (generalized): Secondary | ICD-10-CM | POA: Diagnosis not present

## 2023-09-14 DIAGNOSIS — M25612 Stiffness of left shoulder, not elsewhere classified: Secondary | ICD-10-CM

## 2023-09-14 DIAGNOSIS — G8929 Other chronic pain: Secondary | ICD-10-CM | POA: Diagnosis not present

## 2023-09-14 DIAGNOSIS — M25512 Pain in left shoulder: Secondary | ICD-10-CM | POA: Diagnosis not present

## 2023-09-14 NOTE — Therapy (Signed)
OUTPATIENT PHYSICAL THERAPY TREATMENT   Patient Name: Cindy Carson MRN: [id number] DOB:01-Dec-1970, 52 y.o., female Today's Date: 09/14/2023  END OF SESSION:  PT End of Session - 09/14/23 0805     Visit Number 16    Number of Visits 25    Date for PT Re-Evaluation 10/03/23    Authorization Type UMR    Authorization Time Period 08/25/22 to 10/20/22    PT Start Time 0803    PT Stop Time 0844    PT Time Calculation (min) 41 min    Activity Tolerance Patient tolerated treatment well    Behavior During Therapy [facility] for tasks assessed/performed                     Past Medical History:  Diagnosis Date   Anemia 12/09/2018   Chronic low back pain    Chronic neck pain    Diabetes type 2, uncontrolled    diagnosed in 2019   Hyperlipidemia 08/01/2021   Hypertension complicating diabetes (HCC)    Neck pain 07/11/2021   Obesity 12/09/2018   OSA (obstructive sleep apnea)    sleep study done in [place] in 2019 per pt   Rash 01/28/2022   Screening for colon cancer 08/01/2021   Past Surgical History:  Procedure Laterality Date   COLONOSCOPY     COLONOSCOPY N/A 09/03/2021   Procedure: COLONOSCOPY;  Surgeon: Midge Minium, MD;  Location: [facility] ENDOSCOPY;  Service: Endoscopy;  Laterality: N/A;   SHOULDER ARTHROSCOPY WITH ROTATOR CUFF REPAIR Left 07/07/2023   Procedure: LEFT SHOULDER ARTHROSCOPY / DEBRIDEMENT WITH ROTATOR CUFF REPAIR;  Surgeon: Huel Cote, MD;  Location: [facility];  Service: Orthopedics;  Laterality: Left;   TUBAL LIGATION     Patient Active Problem List   Diagnosis Date Noted   Nontraumatic complete tear of left rotator cuff 07/07/2023   Type 2 diabetes mellitus with hyperglycemia, without long-term current use of insulin (HCC) 07/25/2022   Depression, recurrent (HCC) 07/25/2022   Encounter for screening for malignant neoplasm of breast 07/11/2021   Hyperlipidemia associated with type 2 diabetes mellitus (HCC) 07/11/2021   Personal  history of noncompliance with medical treatment, presenting hazards to health 02/15/2019   History of syphilis 12/09/2018   Bursitis of hip 12/09/2018   Anemia 12/09/2018   Obesity 12/09/2018   OSA (obstructive sleep apnea)    HTN (hypertension)    Hypertension complicating diabetes (HCC)    Chronic low back pain    Chronic neck pain      REFERRING PROVIDER:  Huel Cote, MD    REFERRING DIAG: [id number] (ICD-10-CM) - Nontraumatic complete tear of left rotator cuff  S/p Lt RCR  Rationale for Evaluation and Treatment: Rehabilitation  THERAPY DIAG:  Muscle weakness (generalized)  Acute pain of left shoulder  Stiffness of left shoulder, not elsewhere classified  ONSET DATE: DOS 07/07/23   Days since surgery: 69  SUBJECTIVE:  SUBJECTIVE STATEMENT: Pt reports no pain in shoulder. She does have increased pain at times with increased use of UE.  PERTINENT HISTORY:  DMT2, anemia, chronic neck pain  PAIN:  Are you having pain? No  PRECAUTIONS:  Shoulder  RED FLAGS: None   WEIGHT BEARING RESTRICTIONS:  Yes POSTOPERATIVE PLAN: She will follow the rotator cuff repair protocol.  She will be placed on aspirin for blood clot prevention.  She will be seen by physical therapy postop.  She will be seen at 2 weeks postop for suture removal  FALLS:  Has patient fallen in last 6 months? 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    OBJECTIVE:  Note: Objective measures were completed at Evaluation unless otherwise noted.  PATIENT SURVEYS:  FOTO 13 FOTO:45% 12/9  COGNITIVE STATUS: Within functional limits for tasks  assessed   SENSATION: WFL  EDEMA:  No  POSTURE:  Eval: forward rounded shoulder as expected in sling  HAND DOMINANCE:  Right     Body Part #1 Shoulder  PALPATION: Eval: very guarded motion through shoulder  UPPER EXTREMITY ROM:  Passive ROM Right/Lt Eval 10/18 Left 12/23  Shoulder flexion 20 113!  Shoulder extension    Shoulder abduction 0 86  Shoulder adduction    Shoulder extension    Shoulder internal rotation  75  Shoulder external rotation -10 48  Elbow flexion full   Elbow extension -20    (Blank rows = not tested)      TREATMENT:                                                                                                                                12/23 -PROM all planes -STM to biceps/pec -cane press up x10 -supine cane flexion x10 -supine active flexion x10 -Sidelying active abduction 1x5 (difficult today) -standing active flexion x5 -standing active abd x5   12/18 Manual: gentle PROM into flexion and ER; trigger point release to upper trap; gentle inferior and posterior glides  Supine:  Cane press 2x10  Cane flexion 2x10   Side lying :  ER 2x10  Abduction 2x10   Standing band:     12/16 -PROM all planes -STM to biceps/pec -well arm assist flexion supine x10 -supine active flexion 2x5 -Sidelying active abduction 2x5 -posterior shulder rolls x20 -seated bil ER x10 -Gentle IR behind back x10, 5seconds -finger ladder flexion x5   12/11 Manual: gentle PROM into flexion and ER; trigger point release to upper trap; gentle inferior and posterior glides  Supine: AROM ER 3x10  Wand press 3x10 had some difficulty     12/9: -PROM all planes -well arm assist flexion supine x10 -supine active flexion (very light clinican assist) x5 -posterior shulder rolls x20 -scapular retraction x20 -Ball rolls at table (green physioball) x20  flexion, x20 scaption -pulleys flexion x3min, scaption x54min -Gentle IR behind back  x10 -HEP update   PATIENT EDUCATION:  Education details: Anatomy of condition, POC, HEP, exercise form/rationale Person educated: Patient Education method: Explanation, Demonstration, Tactile cues, Verbal cues, and Handouts Education comprehension: verbalized understanding, returned demonstration, verbal cues required, tactile cues required, and needs further education  HOME EXERCISE PROGRAM: [id number]   ASSESSMENT:  CLINICAL IMPRESSION:  Pt 10 weeks s/p tomorrow. Pt with increased limitations in end range of motion today. Empty end feel present with self limiting due to pain. When measured she was able to tolerate full IR. Most limited with abduction. Remains tender and tight throughout pec and biceps. See updated objective measurements. Discussed with pt importance for self stretching at home. She can also use heating pad to improve tolerance for AAROM. Will continue to work on improving functional ROM and strength in L shoulder.   REHAB POTENTIAL: Good  CLINICAL DECISION MAKING: Stable/uncomplicated  EVALUATION COMPLEXITY: Low   GOALS: Goals reviewed with patient? Yes  SHORT TERM GOALS: Target date: 4 wks (08/04/23)  Passive flexion to 140 deg without increase in pain Baseline: Goal status:90 degrees 11/11  2.  Passive abd to 60 deg without increase in pain Baseline:  Goal status: 60 achieved 11/11  3.  Passive ER at side to 40 deg without increase in pain Baseline:  Goal status 18 11/11   4.  Able to demo proper scapular retraction for proximal shoulder girdle support Baseline:  Goal status: progressing 12/2   LONG TERM GOALS: Target date: POC DATE  Able to demo AROM to shoulder height, against gravity, with good scapular control Baseline:  Goal status: INITIAL  Target date:09/04/23 8 weeks has not begn active motion 12/2   2.  Will begin light resistance exercises pain <=3/10 Baseline:  Goal status: Initiated today with minor pain 12/18  Target date:  8  weeks  3.  Full AROM within 10 deg of opp UE without increase in pain Baseline:  Goal status: INITIAL Target date: 10/02/23 12 weeks  4.  Proper scapular control in proprioceptive and CKC strengthening Baseline:  Goal status: INITIAL Target date: 10/03/23  12 weeks  5.  Able to lift cups/plates and other small objects into overhead cabinets without limitation by shoulder Baseline:  Goal status: INITIAL Target date: 10/03/23  12 weeks    PLAN:  PT FREQUENCY: 1-2x/week  PT DURATION: 12 weeks  PLANNED INTERVENTIONS: 97164- PT Re-evaluation, 97110-Therapeutic exercises, 97530- Therapeutic activity, 97112- Neuromuscular re-education, 97535- Self Care, 95284- Manual therapy, [id number]- Aquatic Therapy, 97014- Electrical stimulation (unattended), [id number]- Traction (mechanical), Patient/Family education, Taping, Dry Needling, Joint mobilization, Spinal mobilization, Scar mobilization, Cryotherapy, and Moist heat.  PLAN FOR NEXT SESSION: continue PROM per protocol  Riki Altes, PTA    09/14/23 9:13 AM

## 2023-09-17 ENCOUNTER — Ambulatory Visit (HOSPITAL_BASED_OUTPATIENT_CLINIC_OR_DEPARTMENT_OTHER): Payer: 59 | Admitting: Physical Therapy

## 2023-09-17 ENCOUNTER — Encounter (HOSPITAL_BASED_OUTPATIENT_CLINIC_OR_DEPARTMENT_OTHER): Payer: Self-pay | Admitting: Physical Therapy

## 2023-09-17 DIAGNOSIS — M25612 Stiffness of left shoulder, not elsewhere classified: Secondary | ICD-10-CM

## 2023-09-17 DIAGNOSIS — G8929 Other chronic pain: Secondary | ICD-10-CM | POA: Diagnosis not present

## 2023-09-17 DIAGNOSIS — M25512 Pain in left shoulder: Secondary | ICD-10-CM

## 2023-09-17 DIAGNOSIS — M6281 Muscle weakness (generalized): Secondary | ICD-10-CM | POA: Diagnosis not present

## 2023-09-17 NOTE — Therapy (Signed)
OUTPATIENT PHYSICAL THERAPY TREATMENT   Patient Name: Cindy Carson MRN: [id number] DOB:November 09, 1970, 52 y.o., female Today's Date: 09/17/2023  END OF SESSION:  PT End of Session - 09/17/23 0815     Visit Number 17    Number of Visits 25    Date for PT Re-Evaluation 10/03/23    Authorization Type UMR    Authorization Time Period 08/25/22 to 10/20/22    PT Start Time 0802    PT Stop Time 0845    PT Time Calculation (min) 43 min    Activity Tolerance Patient tolerated treatment well    Behavior During Therapy [facility] for tasks assessed/performed                      Past Medical History:  Diagnosis Date   Anemia 12/09/2018   Chronic low back pain    Chronic neck pain    Diabetes type 2, uncontrolled    diagnosed in 2019   Hyperlipidemia 08/01/2021   Hypertension complicating diabetes (HCC)    Neck pain 07/11/2021   Obesity 12/09/2018   OSA (obstructive sleep apnea)    sleep study done in [place] in 2019 per pt   Rash 01/28/2022   Screening for colon cancer 08/01/2021   Past Surgical History:  Procedure Laterality Date   COLONOSCOPY     COLONOSCOPY N/A 09/03/2021   Procedure: COLONOSCOPY;  Surgeon: Midge Minium, MD;  Location: [facility] ENDOSCOPY;  Service: Endoscopy;  Laterality: N/A;   SHOULDER ARTHROSCOPY WITH ROTATOR CUFF REPAIR Left 07/07/2023   Procedure: LEFT SHOULDER ARTHROSCOPY / DEBRIDEMENT WITH ROTATOR CUFF REPAIR;  Surgeon: Huel Cote, MD;  Location: [facility];  Service: Orthopedics;  Laterality: Left;   TUBAL LIGATION     Patient Active Problem List   Diagnosis Date Noted   Nontraumatic complete tear of left rotator cuff 07/07/2023   Type 2 diabetes mellitus with hyperglycemia, without long-term current use of insulin (HCC) 07/25/2022   Depression, recurrent (HCC) 07/25/2022   Encounter for screening for malignant neoplasm of breast 07/11/2021   Hyperlipidemia associated with type 2 diabetes mellitus (HCC) 07/11/2021   Personal  history of noncompliance with medical treatment, presenting hazards to health 02/15/2019   History of syphilis 12/09/2018   Bursitis of hip 12/09/2018   Anemia 12/09/2018   Obesity 12/09/2018   OSA (obstructive sleep apnea)    HTN (hypertension)    Hypertension complicating diabetes (HCC)    Chronic low back pain    Chronic neck pain      REFERRING PROVIDER:  Huel Cote, MD    REFERRING DIAG: [phone] (ICD-10-CM) - Nontraumatic complete tear of left rotator cuff  S/p Lt RCR  Rationale for Evaluation and Treatment: Rehabilitation  THERAPY DIAG:  Muscle weakness (generalized)  Acute pain of left shoulder  Stiffness of left shoulder, not elsewhere classified  ONSET DATE: DOS 07/07/23   Days since surgery: 72  SUBJECTIVE:  SUBJECTIVE STATEMENT: Pt reports no pain in shoulder. She does have increased pain at times with increased use of UE.  PERTINENT HISTORY:  DMT2, anemia, chronic neck pain  PAIN:  Are you having pain? No  PRECAUTIONS:  Shoulder  RED FLAGS: None   WEIGHT BEARING RESTRICTIONS:  Yes POSTOPERATIVE PLAN: She will follow the rotator cuff repair protocol.  She will be placed on aspirin for blood clot prevention.  She will be seen by physical therapy postop.  She will be seen at 2 weeks postop for suture removal  FALLS:  Has patient fallen in last 6 months? 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    OBJECTIVE:  Note: Objective measures were completed at Evaluation unless otherwise noted.  PATIENT SURVEYS:  FOTO 13 FOTO:45% 12/9  COGNITIVE STATUS: Within functional limits for tasks  assessed   SENSATION: WFL  EDEMA:  No  POSTURE:  Eval: forward rounded shoulder as expected in sling  HAND DOMINANCE:  Right     Body Part #1 Shoulder  PALPATION: Eval: very guarded motion through shoulder  UPPER EXTREMITY ROM:  Passive ROM Right/Lt Eval 10/18 Left 12/23  Shoulder flexion 20 113!  Shoulder extension    Shoulder abduction 0 86  Shoulder adduction    Shoulder extension    Shoulder internal rotation  75  Shoulder external rotation -10 48  Elbow flexion full   Elbow extension -20    (Blank rows = not tested)      TREATMENT:                                                                                                                              12/26 Manual: gentle PROM into flexion and ER; trigger point release to upper trap; gentle inferior and posterior glides  Supine:  Cane press 2x10 2lb  Cane flexion 2x10 1lb then 2lb  ABC min cung for technique    Side lying :  ER 2x10  Abduction 2x10   Standing band:  Row 2x15  red  Shoulder extension 2x15 red   12/23 -PROM all planes -STM to biceps/pec -cane press up x10 -supine cane flexion x10 -supine active flexion x10 -Sidelying active abduction 1x5 (difficult today) -standing active flexion x5 -standing active abd x5   12/18 Manual: gentle PROM into flexion and ER; trigger point release to upper trap; gentle inferior and posterior glides  Supine:  Cane press 2x10  Cane flexion 2x10   Side lying :  ER 2x10  Abduction 2x10   Standing band:     12/16 -PROM all planes -STM to biceps/pec -well arm assist flexion supine x10 -supine active flexion 2x5 -Sidelying active abduction 2x5 -posterior shulder rolls x20 -seated bil ER x10 -Gentle IR behind back x10, 5seconds -finger ladder flexion x5   12/11 Manual:  gentle PROM into flexion and ER; trigger point release to upper trap; gentle inferior and posterior glides  Supine: AROM ER 3x10  Wand press 3x10 had some  difficulty     12/9: -PROM all planes -well arm assist flexion supine x10 -supine active flexion (very light clinican assist) x5 -posterior shulder rolls x20 -scapular retraction x20 -Ball rolls at table (green physioball) x20 flexion, x20 scaption -pulleys flexion x61min, scaption x38min -Gentle IR behind back x10 -HEP update   PATIENT EDUCATION:  Education details: Anatomy of condition, POC, HEP, exercise form/rationale Person educated: Patient Education method: Explanation, Demonstration, Tactile cues, Verbal cues, and Handouts Education comprehension: verbalized understanding, returned demonstration, verbal cues required, tactile cues required, and needs further education  HOME EXERCISE PROGRAM: [id number]   ASSESSMENT:  CLINICAL IMPRESSION:  Therapy advanced the patients weights with strengthening S he was advised that she needs to rep them at home so we can move her forward. We added in supine ABC. We also advanced her weight with supine flexion and side lying ER. She was given a green band for home. See below for goal specific progress   REHAB POTENTIAL: Good  CLINICAL DECISION MAKING: Stable/uncomplicated  EVALUATION COMPLEXITY: Low   GOALS: Goals reviewed with patient? Yes  SHORT TERM GOALS: Target date: 4 wks (08/04/23)  Passive flexion to 140 deg without increase in pain Baseline: Goal status:90 degrees 11/11  2.  Passive abd to 60 deg without increase in pain Baseline:  Goal status: 60 achieved 11/11  3.  Passive ER at side to 40 deg without increase in pain Baseline:  Goal status 18 11/11   4.  Able to demo proper scapular retraction for proximal shoulder girdle support Baseline:  Goal status: progressing 12/2   LONG TERM GOALS: Target date: POC DATE  Able to demo AROM to shoulder height, against gravity, with good scapular control Baseline:  Goal status: INITIAL  Target date:09/04/23 8 weeks has not begn active motion 12/2   2.  Will begin  light resistance exercises pain <=3/10 Baseline:  Goal status: Initiated today with minor pain 12/18  Target date:  8 weeks  3.  Full AROM within 10 deg of opp UE without increase in pain Baseline:  Goal status: INITIAL Target date: 10/02/23 12 weeks continues to be limited in active 12/26   4.  Proper scapular control in proprioceptive and CKC strengthening Baseline:  Goal status: INITIAL Target date: 10/03/23  12 weeks  5.  Able to lift cups/plates and other small objects into overhead cabinets without limitation by shoulder Baseline:  Goal status: INITIAL Target date: 10/03/23  12 weeks    PLAN:  PT FREQUENCY: 1-2x/week  PT DURATION: 12 weeks  PLANNED INTERVENTIONS: 97164- PT Re-evaluation, 97110-Therapeutic exercises, 97530- Therapeutic activity, 97112- Neuromuscular re-education, 97535- Self Care, 84166- Manual therapy, [id number]- Aquatic Therapy, 97014- Electrical stimulation (unattended), [phone]- Traction (mechanical), Patient/Family education, Taping, Dry Needling, Joint mobilization, Spinal mobilization, Scar mobilization, Cryotherapy, and Moist heat.  PLAN FOR NEXT SESSION: continue PROM per protocol  Riki Altes, PTA    09/17/23 9:01 AM

## 2023-09-21 ENCOUNTER — Encounter (HOSPITAL_BASED_OUTPATIENT_CLINIC_OR_DEPARTMENT_OTHER): Payer: Self-pay | Admitting: Physical Therapy

## 2023-09-21 ENCOUNTER — Ambulatory Visit (HOSPITAL_BASED_OUTPATIENT_CLINIC_OR_DEPARTMENT_OTHER): Payer: 59 | Admitting: Physical Therapy

## 2023-09-21 DIAGNOSIS — M6281 Muscle weakness (generalized): Secondary | ICD-10-CM

## 2023-09-21 DIAGNOSIS — M25512 Pain in left shoulder: Secondary | ICD-10-CM

## 2023-09-21 DIAGNOSIS — M25612 Stiffness of left shoulder, not elsewhere classified: Secondary | ICD-10-CM

## 2023-09-21 DIAGNOSIS — G8929 Other chronic pain: Secondary | ICD-10-CM

## 2023-09-21 NOTE — Therapy (Signed)
OUTPATIENT PHYSICAL THERAPY TREATMENT   Patient Name: Cindy Carson MRN: [id number] DOB:[birth year], 52 y.o., female Today's Date: 09/21/2023  END OF SESSION:  PT End of Session - 09/21/23 0808     Visit Number 18    Number of Visits 25    Date for PT Re-Evaluation 10/03/23    Authorization Type UMR    Authorization Time Period 08/25/22 to 10/20/22    PT Start Time 0800    PT Stop Time 0843    PT Time Calculation (min) 43 min    Activity Tolerance Patient tolerated treatment well    Behavior During Therapy [facility] for tasks assessed/performed                      Past Medical History:  Diagnosis Date   Anemia 12/09/2018   Chronic low back pain    Chronic neck pain    Diabetes type 2, uncontrolled    diagnosed in 2019   Hyperlipidemia 08/01/2021   Hypertension complicating diabetes (HCC)    Neck pain 07/11/2021   Obesity 12/09/2018   OSA (obstructive sleep apnea)    sleep study done in [place] in 2019 per pt   Rash 01/28/2022   Screening for colon cancer 08/01/2021   Past Surgical History:  Procedure Laterality Date   COLONOSCOPY     COLONOSCOPY N/A 09/03/2021   Procedure: COLONOSCOPY;  Surgeon: Midge Minium, MD;  Location: [facility] ENDOSCOPY;  Service: Endoscopy;  Laterality: N/A;   SHOULDER ARTHROSCOPY WITH ROTATOR CUFF REPAIR Left 07/07/2023   Procedure: LEFT SHOULDER ARTHROSCOPY / DEBRIDEMENT WITH ROTATOR CUFF REPAIR;  Surgeon: Huel Cote, MD;  Location: [facility];  Service: Orthopedics;  Laterality: Left;   TUBAL LIGATION     Patient Active Problem List   Diagnosis Date Noted   Nontraumatic complete tear of left rotator cuff 07/07/2023   Type 2 diabetes mellitus with hyperglycemia, without long-term current use of insulin (HCC) 07/25/2022   Depression, recurrent (HCC) 07/25/2022   Encounter for screening for malignant neoplasm of breast 07/11/2021   Hyperlipidemia associated with type 2 diabetes mellitus (HCC) 07/11/2021   Personal  history of noncompliance with medical treatment, presenting hazards to health 02/15/2019   History of syphilis 12/09/2018   Bursitis of hip 12/09/2018   Anemia 12/09/2018   Obesity 12/09/2018   OSA (obstructive sleep apnea)    HTN (hypertension)    Hypertension complicating diabetes (HCC)    Chronic low back pain    Chronic neck pain      REFERRING PROVIDER:  Huel Cote, MD    REFERRING DIAG: [phone] (ICD-10-CM) - Nontraumatic complete tear of left rotator cuff  S/p Lt RCR  Rationale for Evaluation and Treatment: Rehabilitation  THERAPY DIAG:  Muscle weakness (generalized)  Acute pain of left shoulder  Stiffness of left shoulder, not elsewhere classified  Chronic left shoulder pain  ONSET DATE: DOS 07/07/23   Days since surgery: 76  SUBJECTIVE:  SUBJECTIVE STATEMENT: The patient has no significant complaints. She has been working on her stretches and exercies. She did have some elbow irritation yesterday.   PERTINENT HISTORY:  DMT2, anemia, chronic neck pain  PAIN:  Are you having pain? No  PRECAUTIONS:  Shoulder  RED FLAGS: None   WEIGHT BEARING RESTRICTIONS:  Yes POSTOPERATIVE PLAN: She will follow the rotator cuff repair protocol.  She will be placed on aspirin for blood clot prevention.  She will be seen by physical therapy postop.  She will be seen at 2 weeks postop for suture removal  FALLS:  Has patient fallen in last 6 months? 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    OBJECTIVE:  Note: Objective measures were completed at Evaluation unless otherwise noted.  PATIENT SURVEYS:  FOTO 13 FOTO:45% 12/9  COGNITIVE  STATUS: Within functional limits for tasks assessed   SENSATION: WFL  EDEMA:  No  POSTURE:  Eval: forward rounded shoulder as expected in sling  HAND DOMINANCE:  Right     Body Part #1 Shoulder  PALPATION: Eval: very guarded motion through shoulder  UPPER EXTREMITY ROM:  Passive ROM Right/Lt Eval 10/18 Left 12/23  Shoulder flexion 20 113!  Shoulder extension    Shoulder abduction 0 86  Shoulder adduction    Shoulder extension    Shoulder internal rotation  75  Shoulder external rotation -10 48  Elbow flexion full   Elbow extension -20    (Blank rows = not tested)      TREATMENT:                                                                                                                              12/30 Manual: gentle PROM into flexion and ER; trigger point release to upper trap; gentle inferior and posterior glides  Supine:   Cane flexion 2x12 2lb  ABC min cung for technique 1 lb  Side lying :  ER 2x10   Standing:  Row 2x15  green Shoulder extension 2x15 green    12/26 Manual: gentle PROM into flexion and ER; trigger point release to upper trap; gentle inferior and posterior glides  Supine:  Cane press 2x10 2lb  Cane flexion 2x10 1lb then 2lb  ABC min cung for technique    Side lying :  ER 2x10  Abduction 2x10   Standing band:  Row 2x15  red  Shoulder extension 2x15 red   12/23 -PROM all planes -STM to biceps/pec -cane press up x10 -supine cane flexion x10 -supine active flexion x10 -Sidelying active abduction 1x5 (difficult today) -standing active flexion x5 -standing active abd x5   12/18 Manual: gentle PROM into flexion and ER; trigger point release to upper trap; gentle inferior and posterior glides  Supine:  Cane press 2x10  Cane flexion 2x10  Side lying :  ER 2x10  Abduction 2x10   Standing band:     12/16 -PROM all planes -STM to biceps/pec -well arm assist flexion supine x10 -supine active flexion  2x5 -Sidelying active abduction 2x5 -posterior shulder rolls x20 -seated bil ER x10 -Gentle IR behind back x10, 5seconds -finger ladder flexion x5   12/11 Manual: gentle PROM into flexion and ER; trigger point release to upper trap; gentle inferior and posterior glides  Supine: AROM ER 3x10  Wand press 3x10 had some difficulty     12/9: -PROM all planes -well arm assist flexion supine x10 -supine active flexion (very light clinican assist) x5 -posterior shulder rolls x20 -scapular retraction x20 -Ball rolls at table (green physioball) x20 flexion, x20 scaption -pulleys flexion x53min, scaption x5min -Gentle IR behind back x10 -HEP update   PATIENT EDUCATION:  Education details: Anatomy of condition, POC, HEP, exercise form/rationale Person educated: Patient Education method: Explanation, Demonstration, Tactile cues, Verbal cues, and Handouts Education comprehension: verbalized understanding, returned demonstration, verbal cues required, tactile cues required, and needs further education  HOME EXERCISE PROGRAM: [id number]   ASSESSMENT:  CLINICAL IMPRESSION:  The patient continues to progress well. We were able to advance her weights. Her ROM into flexion is WNL. Her ER has mild limitations.See below for goal specific progress     REHAB POTENTIAL: Good  CLINICAL DECISION MAKING: Stable/uncomplicated  EVALUATION COMPLEXITY: Low   GOALS: Goals reviewed with patient? Yes  SHORT TERM GOALS: Target date: 4 wks (08/04/23)  Passive flexion to 140 deg without increase in pain Baseline: Goal status:achieved 12/30   2.  Passive abd to 60 deg without increase in pain Baseline:  Goal status: 60 achieved 11/11  3.  Passive ER at side to 40 deg without increase in pain Baseline:  Goal status 18 11/11   4.  Able to demo proper scapular retraction for proximal shoulder girdle support Baseline:  Goal status: progressing 12/2   LONG TERM GOALS: Target date: POC  DATE  Able to demo AROM to shoulder height, against gravity, with good scapular control Baseline:  Goal status: INITIAL  Target date:09/04/23 8 weeks has not begn active motion 12/2   2.  Will begin light resistance exercises pain <=3/10 Baseline:  Goal status: Initiated today with minor pain 12/18  Target date:  8 weeks  3.  Full AROM within 10 deg of opp UE without increase in pain Baseline:  Goal status: INITIAL Target date: 10/02/23 12 weeks continues to be limited in active 12/26   4.  Proper scapular control in proprioceptive and CKC strengthening Baseline:  Goal status: INITIAL Target date: 10/03/23  12 weeks  5.  Able to lift cups/plates and other small objects into overhead cabinets without limitation by shoulder Baseline:  Goal status: INITIAL Target date: 10/03/23  12 weeks    PLAN:  PT FREQUENCY: 1-2x/week  PT DURATION: 12 weeks  PLANNED INTERVENTIONS: 97164- PT Re-evaluation, 97110-Therapeutic exercises, 97530- Therapeutic activity, 97112- Neuromuscular re-education, 97535- Self Care, 78469- Manual therapy, [id number]- Aquatic Therapy, 97014- Electrical stimulation (unattended), [phone]- Traction (mechanical), Patient/Family education, Taping, Dry Needling, Joint mobilization, Spinal mobilization, Scar mobilization, Cryotherapy, and Moist heat.  PLAN FOR NEXT SESSION: continue PROM per protocol  Lorayne Bender PT DPT    09/21/23 8:57 AM

## 2023-09-24 ENCOUNTER — Encounter (HOSPITAL_BASED_OUTPATIENT_CLINIC_OR_DEPARTMENT_OTHER): Payer: Self-pay

## 2023-09-24 ENCOUNTER — Ambulatory Visit (HOSPITAL_BASED_OUTPATIENT_CLINIC_OR_DEPARTMENT_OTHER): Payer: 59 | Admitting: Physical Therapy

## 2023-09-25 ENCOUNTER — Encounter (HOSPITAL_BASED_OUTPATIENT_CLINIC_OR_DEPARTMENT_OTHER): Payer: Self-pay | Admitting: Student

## 2023-09-25 ENCOUNTER — Ambulatory Visit (INDEPENDENT_AMBULATORY_CARE_PROVIDER_SITE_OTHER): Payer: 59 | Admitting: Student

## 2023-09-25 ENCOUNTER — Ambulatory Visit (HOSPITAL_BASED_OUTPATIENT_CLINIC_OR_DEPARTMENT_OTHER): Payer: 59

## 2023-09-25 ENCOUNTER — Other Ambulatory Visit (HOSPITAL_BASED_OUTPATIENT_CLINIC_OR_DEPARTMENT_OTHER): Payer: Self-pay

## 2023-09-25 DIAGNOSIS — M545 Low back pain, unspecified: Secondary | ICD-10-CM | POA: Diagnosis not present

## 2023-09-25 DIAGNOSIS — M5441 Lumbago with sciatica, right side: Secondary | ICD-10-CM

## 2023-09-25 DIAGNOSIS — M47816 Spondylosis without myelopathy or radiculopathy, lumbar region: Secondary | ICD-10-CM | POA: Diagnosis not present

## 2023-09-25 DIAGNOSIS — M8580 Other specified disorders of bone density and structure, unspecified site: Secondary | ICD-10-CM | POA: Diagnosis not present

## 2023-09-25 DIAGNOSIS — I878 Other specified disorders of veins: Secondary | ICD-10-CM | POA: Diagnosis not present

## 2023-09-25 MED ORDER — METHOCARBAMOL 500 MG PO TABS
500.0000 mg | ORAL_TABLET | Freq: Four times a day (QID) | ORAL | 0 refills | Status: AC
  Filled 2023-09-25: qty 40, 10d supply, fill #0

## 2023-09-25 NOTE — Progress Notes (Signed)
 Chief Complaint: Low back pain     History of Present Illness:    Cindy Carson is a 53 y.o. female presents today for evaluation of right-sided low back pain.  This began 1 week ago without any injury and has been worsening over the last 3 days.  She is recovering from a right shoulder rotator cuff repair done back in October and is regularly going to physical therapy.  States that pain is only right-sided and does travel down the outside of the right leg into the foot.  Denies any numbness or tingling.  Pain is rated as severe and described as sharp.  Has taken Norco 5-325 as needed for pain and has tried heat, ice, and a muscle relaxer.  Denies cauda equina symptoms.   Surgical History:   Right shoulder arthroscopy with rotator cuff repair - 07/07/2023  PMH/PSH/Family History/Social History/Meds/Allergies:    Past Medical History:  Diagnosis Date   Anemia 12/09/2018   Chronic low back pain    Chronic neck pain    Diabetes type 2, uncontrolled    diagnosed in 2019   Hyperlipidemia 08/01/2021   Hypertension complicating diabetes (HCC)    Neck pain 07/11/2021   Obesity 12/09/2018   OSA (obstructive sleep apnea)    sleep study done in [place] in 2019 per pt   Rash 01/28/2022   Screening for colon cancer 08/01/2021   Past Surgical History:  Procedure Laterality Date   COLONOSCOPY     COLONOSCOPY N/A 09/03/2021   Procedure: COLONOSCOPY;  Surgeon: Jinny Carmine, MD;  Location: [facility] ENDOSCOPY;  Service: Endoscopy;  Laterality: N/A;   SHOULDER ARTHROSCOPY WITH ROTATOR CUFF REPAIR Left 07/07/2023   Procedure: LEFT SHOULDER ARTHROSCOPY / DEBRIDEMENT WITH ROTATOR CUFF REPAIR;  Surgeon: Genelle Standing, MD;  Location: [facility];  Service: Orthopedics;  Laterality: Left;   TUBAL LIGATION     Social History   Socioeconomic History   Marital status: Legally Separated    Spouse name: Not on file   Number of children: Not on file   Years  of education: Not on file   Highest education level: Associate degree: academic program  Occupational History   Not on file  Tobacco Use   Smoking status: Never   Smokeless tobacco: Never  Vaping Use   Vaping status: Never Used  Substance and Sexual Activity   Alcohol use: Never   Drug use: Never   Sexual activity: Not Currently    Partners: Male    Birth control/protection: Surgical, Post-menopausal    Comment: BTL, First IC <16 y/o, >5 Partners, Hx of RPR, GC  Other Topics Concern   Not on file  Social History Narrative   Not on file   Social Drivers of Health   Financial Resource Strain: Low Risk  (08/11/2023)   Overall Financial Resource Strain (CARDIA)    Difficulty of Paying Living Expenses: Not very hard  Food Insecurity: Food Insecurity Present (08/11/2023)   Hunger Vital Sign    Worried About Running Out of Food in the Last Year: Sometimes true    Ran Out of Food in the Last Year: Sometimes true  Transportation Needs: No Transportation Needs (08/11/2023)   PRAPARE - Administrator, Civil Service (Medical): No    Lack of Transportation (Non-Medical): No  Physical Activity: Insufficiently  Active (08/11/2023)   Exercise Vital Sign    Days of Exercise per Week: 2 days    Minutes of Exercise per Session: 10 min  Stress: No Stress Concern Present (08/11/2023)   [employer] of Occupational Health - Occupational Stress Questionnaire    Feeling of Stress : Only a little  Social Connections: Socially Isolated (08/11/2023)   Social Connection and Isolation Panel [NHANES]    Frequency of Communication with Friends and Family: More than three times a week    Frequency of Social Gatherings with Friends and Family: Once a week    Attends Religious Services: Never    Database Administrator or Organizations: No    Attends Engineer, Structural: Not on file    Marital Status: Separated   Family History  Problem Relation Age of Onset   Varicose Veins  Father    Hypertension Sister    Diabetes Sister    Diabetes Sister    Heart murmur Brother    Cancer Maternal Grandmother    Allergies  Allergen Reactions   Nsaids Nausea Only    nausea   Tape Rash    Surgical tape    Current Outpatient Medications  Medication Sig Dispense Refill   methocarbamol  (ROBAXIN ) 500 MG tablet Take 1 tablet (500 mg total) by mouth 4 (four) times daily for 10 days. 40 tablet 0   amLODipine  (NORVASC ) 5 MG tablet Take 1 tablet by mouth once daily 90 tablet 1   atorvastatin  (LIPITOR) 20 MG tablet Take 1 tablet by mouth once daily 90 tablet 1   Blood Glucose Monitoring Suppl (BLOOD GLUCOSE MONITOR SYSTEM) w/Device KIT 1 each by Does not apply route in the morning, at noon, and at bedtime. 1 kit 0   DULoxetine  (CYMBALTA ) 30 MG capsule Take 1 capsule (30 mg total) by mouth daily. 90 capsule 1   glipiZIDE  (GLUCOTROL ) 5 MG tablet Take 1 tablet (5 mg total) by mouth 2 (two) times daily before a meal. 180 tablet 1   Glucose Blood (BLOOD GLUCOSE TEST STRIPS) STRP For monitoring blood sugar levels up to 4 times a day. May substitute to any manufacturer covered by patient's insurance. 200 strip 99   insulin  glargine-yfgn (SEMGLEE , YFGN,) 100 UNIT/ML Pen Inject 10 Units into the skin at bedtime. Take 10 UNITS nightly, may go up by 3 UNITS every 3 days if morning sugars are greater than 130 in increments of (13 UNITS, 16 UNITS, 19 UNITS, 22 UNITS, 25 UNITS every 3 days based on morning sugars) do not go higher than 25 UNITS. 15 mL 2   Insulin  Pen Needle (UNIFINE PENTIPS) 31G X 6 MM MISC Use daily with insulin  100 each 1   Lancet Device MISC For monitoring blood sugar up to 4 times a day. May substitute to any manufacturer covered by patient's insurance. 200 each 99   Lancets (FREESTYLE) lancets Test 4x daily 200 each 11   Semaglutide , 1 MG/DOSE, 4 MG/3ML SOPN Inject 1 mg into the skin once a week. 6 mL 1   No current facility-administered medications for this visit.   No  results found.  Review of Systems:   A ROS was performed including pertinent positives and negatives as documented in the HPI.  Physical Exam :   Constitutional: NAD and appears stated age Neurological: Alert and oriented Psych: Appropriate affect and cooperative Last menstrual period 03/09/2019.   Comprehensive Musculoskeletal Exam:    Tenderness to palpation throughout the musculature in the right lumbar region  without midline tenderness.  Passive right hip range of motion slightly limited in flexion due to pain.  Bilateral knee flexion/extension and ankle dorsiflexion/plantarflexion strength is 5/5.  Patellar reflexes 2+ and equal.  Negative straight leg raise and Faber bilaterally.  Imaging:   Xray (lumbar spine 4 views): No evidence of acute abnormality.  Anterior superior spurring of the L1-L4 vertebral bodies.  Mild spondylolisthesis of L4 on L5 but otherwise negative.   I personally reviewed and interpreted the radiographs.   Assessment:   53 y.o. female with 1 week history of right sided low back pain with sciatica.  No red flag symptoms present.  I have discussed treatment options of which include physical therapy.  She is currently working with PT for recovery from rotator cuff repair so I would like to see about adding on her low back to address this as well.  Will plan to send in a new prescription of Robaxin  that she is unsure which muscle relaxer she is taking and it is potentially out of date.  Can continue taking the hydrocodone  she has leftover from surgery as needed.  Will plan to check in on symptoms at next postop visit on 1/15.  Plan :    - Start Robaxin  500 mg as needed - Referral to physical therapy for low back pain     I personally saw and evaluated the patient, and participated in the management and treatment plan.  Leonce Reveal, PA-C Orthopedics

## 2023-09-28 ENCOUNTER — Encounter (HOSPITAL_BASED_OUTPATIENT_CLINIC_OR_DEPARTMENT_OTHER): Payer: Self-pay

## 2023-09-28 ENCOUNTER — Ambulatory Visit (HOSPITAL_BASED_OUTPATIENT_CLINIC_OR_DEPARTMENT_OTHER): Payer: 59 | Attending: Orthopaedic Surgery

## 2023-09-28 DIAGNOSIS — M25612 Stiffness of left shoulder, not elsewhere classified: Secondary | ICD-10-CM | POA: Diagnosis not present

## 2023-09-28 DIAGNOSIS — G8929 Other chronic pain: Secondary | ICD-10-CM | POA: Diagnosis not present

## 2023-09-28 DIAGNOSIS — M6281 Muscle weakness (generalized): Secondary | ICD-10-CM | POA: Insufficient documentation

## 2023-09-28 DIAGNOSIS — M25512 Pain in left shoulder: Secondary | ICD-10-CM | POA: Insufficient documentation

## 2023-09-28 DIAGNOSIS — M5441 Lumbago with sciatica, right side: Secondary | ICD-10-CM | POA: Diagnosis not present

## 2023-09-28 NOTE — Therapy (Signed)
 OUTPATIENT PHYSICAL THERAPY TREATMENT   Patient Name: Cindy Carson MRN: [id number] DOB:1971/07/03, 53 y.o., female Today's Date: 09/28/2023  END OF SESSION:  PT End of Session - 09/28/23 1557     Visit Number 19    Number of Visits 25    Date for PT Re-Evaluation 10/03/23    Authorization Type UMR    PT Start Time 1559    PT Stop Time 1648    PT Time Calculation (min) 49 min    Activity Tolerance Patient tolerated treatment well    Behavior During Therapy [facility] for tasks assessed/performed                       Past Medical History:  Diagnosis Date   Anemia 12/09/2018   Chronic low back pain    Chronic neck pain    Diabetes type 2, uncontrolled    diagnosed in 2019   Hyperlipidemia 08/01/2021   Hypertension complicating diabetes (HCC)    Neck pain 07/11/2021   Obesity 12/09/2018   OSA (obstructive sleep apnea)    sleep study done in [place] in 2019 per pt   Rash 01/28/2022   Screening for colon cancer 08/01/2021   Past Surgical History:  Procedure Laterality Date   COLONOSCOPY     COLONOSCOPY N/A 09/03/2021   Procedure: COLONOSCOPY;  Surgeon: Jinny Carmine, MD;  Location: [facility] ENDOSCOPY;  Service: Endoscopy;  Laterality: N/A;   SHOULDER ARTHROSCOPY WITH ROTATOR CUFF REPAIR Left 07/07/2023   Procedure: LEFT SHOULDER ARTHROSCOPY / DEBRIDEMENT WITH ROTATOR CUFF REPAIR;  Surgeon: Genelle Standing, MD;  Location: [facility];  Service: Orthopedics;  Laterality: Left;   TUBAL LIGATION     Patient Active Problem List   Diagnosis Date Noted   Nontraumatic complete tear of left rotator cuff 07/07/2023   Type 2 diabetes mellitus with hyperglycemia, without long-term current use of insulin  (HCC) 07/25/2022   Depression, recurrent (HCC) 07/25/2022   Encounter for screening for malignant neoplasm of breast 07/11/2021   Hyperlipidemia associated with type 2 diabetes mellitus (HCC) 07/11/2021   Personal history of noncompliance with medical treatment,  presenting hazards to health 02/15/2019   History of syphilis 12/09/2018   Bursitis of hip 12/09/2018   Anemia 12/09/2018   Obesity 12/09/2018   OSA (obstructive sleep apnea)    HTN (hypertension)    Hypertension complicating diabetes (HCC)    Chronic low back pain    Chronic neck pain      REFERRING PROVIDER:  Genelle Standing, MD    REFERRING DIAG: [phone] (ICD-10-CM) - Nontraumatic complete tear of left rotator cuff  S/p Lt RCR  Rationale for Evaluation and Treatment: Rehabilitation  THERAPY DIAG:  Muscle weakness (generalized)  Acute pain of left shoulder  Stiffness of left shoulder, not elsewhere classified  ONSET DATE: DOS 07/07/23   Days since surgery: 83  SUBJECTIVE:  SUBJECTIVE STATEMENT: Pt has been experiencing muscles spasms in low back and sciatica of new onset right before New Years. She saw PA on 1/3 and was prescribed muscle relaxers which have helped mildly. PA also sent a script for PT for this as well.  Plan to bring up concerns at next MD appt.   PERTINENT HISTORY:  DMT2, anemia, chronic neck pain  PAIN:  Are you having pain? No  PRECAUTIONS:  Shoulder  RED FLAGS: None   WEIGHT BEARING RESTRICTIONS:  Yes POSTOPERATIVE PLAN: She will follow the rotator cuff repair protocol.  She will be placed on aspirin  for blood clot prevention.  She will be seen by physical therapy postop.  She will be seen at 2 weeks postop for suture removal  FALLS:  Has patient fallen in last 6 months? 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    OBJECTIVE:  Note: Objective measures were completed at Evaluation unless otherwise noted.  PATIENT  SURVEYS:  FOTO 13 FOTO:45% 12/9  COGNITIVE STATUS: Within functional limits for tasks assessed   SENSATION: WFL  EDEMA:  No  POSTURE:  Eval: forward rounded shoulder as expected in sling  HAND DOMINANCE:  Right     Body Part #1 Shoulder  PALPATION: Eval: very guarded motion through shoulder  UPPER EXTREMITY ROM:  Passive ROM Right/Lt Eval 10/18 Left 12/23  Shoulder flexion 20 113!  Shoulder extension    Shoulder abduction 0 86  Shoulder adduction    Shoulder extension    Shoulder internal rotation  75  Shoulder external rotation -10 48  Elbow flexion full   Elbow extension -20    (Blank rows = not tested)      TREATMENT:                                                                                                                                1/6 PROM all planes  Supine:   Cane flexion 2x12 2lb  ABC min cung for technique 1 lb   Standing:  Wall slides flexion and scaption- cues for full available range x10ea  Seated active flexion to 90deg 1# 2x10 Seated bicep curls 2# 2x10 Seated press out 1# 2x10 Seated ER- YTB 2x10  Pulleys flexion x58min   12/30 Manual: gentle PROM into flexion and ER; trigger point release to upper trap; gentle inferior and posterior glides  Supine:   Cane flexion 2x12 2lb  ABC min cung for technique 1 lb  Side lying :  ER 2x10   Standing:  Row 2x15  green Shoulder extension 2x15 green    12/26 Manual: gentle PROM into flexion and ER; trigger point release to upper trap; gentle inferior and posterior glides  Supine:  Cane press 2x10 2lb  Cane flexion 2x10 1lb then 2lb  ABC  min cung for technique    Side lying :  ER 2x10  Abduction 2x10   Standing band:  Row 2x15  red  Shoulder extension 2x15 red     PATIENT EDUCATION:  Education details: Teacher, Music of condition, POC, HEP, exercise form/rationale Person educated: Patient Education method: Explanation, Demonstration, Tactile cues, Verbal  cues, and Handouts Education comprehension: verbalized understanding, returned demonstration, verbal cues required, tactile cues required, and needs further education  HOME EXERCISE PROGRAM: [id number]   ASSESSMENT:  CLINICAL IMPRESSION:  Pt remains stiff with some self limiting at end range flexion, though this did improve as PROM continued. She was limited with standing exercises today due to her new onset of LBP, so performed exercises seated. She was able to progress gently with light resistance. Attempted 2# active flexion to 90deg in seated position, though this was decreased to 1# due to difficulty. Discussed re-eval expectations with pt and got her on schedule to see PT. Verbally reviewed HEP and progressions.    REHAB POTENTIAL: Good  CLINICAL DECISION MAKING: Stable/uncomplicated  EVALUATION COMPLEXITY: Low   GOALS: Goals reviewed with patient? Yes  SHORT TERM GOALS: Target date: 4 wks (08/04/23)  Passive flexion to 140 deg without increase in pain Baseline: Goal status:achieved 12/30   2.  Passive abd to 60 deg without increase in pain Baseline:  Goal status: 60 achieved 11/11  3.  Passive ER at side to 40 deg without increase in pain Baseline:  Goal status 18 11/11   4.  Able to demo proper scapular retraction for proximal shoulder girdle support Baseline:  Goal status: progressing 12/2   LONG TERM GOALS: Target date: POC DATE  Able to demo AROM to shoulder height, against gravity, with good scapular control Baseline:  Goal status: INITIAL  Target date:09/04/23 8 weeks has not begn active motion 12/2   2.  Will begin light resistance exercises pain <=3/10 Baseline:  Goal status: Initiated today with minor pain 12/18  Target date:  8 weeks  3.  Full AROM within 10 deg of opp UE without increase in pain Baseline:  Goal status: INITIAL Target date: 10/02/23 12 weeks continues to be limited in active 12/26   4.  Proper scapular control in  proprioceptive and CKC strengthening Baseline:  Goal status: INITIAL Target date: 10/03/23  12 weeks  5.  Able to lift cups/plates and other small objects into overhead cabinets without limitation by shoulder Baseline:  Goal status: INITIAL Target date: 10/03/23  12 weeks    PLAN:  PT FREQUENCY: 1-2x/week  PT DURATION: 12 weeks  PLANNED INTERVENTIONS: 97164- PT Re-evaluation, 97110-Therapeutic exercises, 97530- Therapeutic activity, 97112- Neuromuscular re-education, 97535- Self Care, 02859- Manual therapy, [id number]- Aquatic Therapy, 97014- Electrical stimulation (unattended), [phone]- Traction (mechanical), Patient/Family education, Taping, Dry Needling, Joint mobilization, Spinal mobilization, Scar mobilization, Cryotherapy, and Moist heat.  PLAN FOR NEXT SESSION: continue PROM per protocol  Asberry Rodes, PTA     09/28/23 5:01 PM

## 2023-09-29 ENCOUNTER — Ambulatory Visit (HOSPITAL_BASED_OUTPATIENT_CLINIC_OR_DEPARTMENT_OTHER): Payer: 59 | Admitting: Physical Therapy

## 2023-09-29 DIAGNOSIS — M6281 Muscle weakness (generalized): Secondary | ICD-10-CM | POA: Diagnosis not present

## 2023-09-29 DIAGNOSIS — M25612 Stiffness of left shoulder, not elsewhere classified: Secondary | ICD-10-CM

## 2023-09-29 DIAGNOSIS — G8929 Other chronic pain: Secondary | ICD-10-CM

## 2023-09-29 DIAGNOSIS — M25512 Pain in left shoulder: Secondary | ICD-10-CM | POA: Diagnosis not present

## 2023-09-29 DIAGNOSIS — M5441 Lumbago with sciatica, right side: Secondary | ICD-10-CM | POA: Diagnosis not present

## 2023-09-29 NOTE — Therapy (Signed)
 OUTPATIENT PHYSICAL THERAPY TREATMENT   Patient Name: Cindy Carson MRN: [id number] DOB:11/19/1970, 53 y.o., female Today's Date: 09/30/2023  END OF SESSION:  PT End of Session - 09/29/23 1215     Visit Number 20    Number of Visits 25    Date for PT Re-Evaluation 11/25/23    Authorization Type UMR    Authorization Time Period 08/25/22 to 10/20/22    PT Start Time 1145    PT Stop Time 1228    PT Time Calculation (min) 43 min    Activity Tolerance Patient tolerated treatment well    Behavior During Therapy [facility] for tasks assessed/performed                        Past Medical History:  Diagnosis Date   Anemia 12/09/2018   Chronic low back pain    Chronic neck pain    Diabetes type 2, uncontrolled    diagnosed in 2019   Hyperlipidemia 08/01/2021   Hypertension complicating diabetes (HCC)    Neck pain 07/11/2021   Obesity 12/09/2018   OSA (obstructive sleep apnea)    sleep study done in [place] in 2019 per pt   Rash 01/28/2022   Screening for colon cancer 08/01/2021   Past Surgical History:  Procedure Laterality Date   COLONOSCOPY     COLONOSCOPY N/A 09/03/2021   Procedure: COLONOSCOPY;  Surgeon: Jinny Carmine, MD;  Location: [facility] ENDOSCOPY;  Service: Endoscopy;  Laterality: N/A;   SHOULDER ARTHROSCOPY WITH ROTATOR CUFF REPAIR Left 07/07/2023   Procedure: LEFT SHOULDER ARTHROSCOPY / DEBRIDEMENT WITH ROTATOR CUFF REPAIR;  Surgeon: Genelle Standing, MD;  Location: [facility];  Service: Orthopedics;  Laterality: Left;   TUBAL LIGATION     Patient Active Problem List   Diagnosis Date Noted   Nontraumatic complete tear of left rotator cuff 07/07/2023   Type 2 diabetes mellitus with hyperglycemia, without long-term current use of insulin  (HCC) 07/25/2022   Depression, recurrent (HCC) 07/25/2022   Encounter for screening for malignant neoplasm of breast 07/11/2021   Hyperlipidemia associated with type 2 diabetes mellitus (HCC) 07/11/2021   Personal  history of noncompliance with medical treatment, presenting hazards to health 02/15/2019   History of syphilis 12/09/2018   Bursitis of hip 12/09/2018   Anemia 12/09/2018   Obesity 12/09/2018   OSA (obstructive sleep apnea)    HTN (hypertension)    Hypertension complicating diabetes (HCC)    Chronic low back pain    Chronic neck pain      REFERRING PROVIDER:  Genelle Standing, MD    REFERRING DIAG: [phone] (ICD-10-CM) - Nontraumatic complete tear of left rotator cuff  S/p Lt RCR  Rationale for Evaluation and Treatment: Rehabilitation  THERAPY DIAG:  Muscle weakness (generalized)  Acute pain of left shoulder  Stiffness of left shoulder, not elsewhere classified  Chronic left shoulder pain  ONSET DATE: DOS 07/07/23   Days since surgery: 84  SUBJECTIVE:  SUBJECTIVE STATEMENT: On New Years day the patient had an acute onset of right sided lower back pain that radiates down to her foot. She had acute spasming but that has resolved since she started taking the muscle relaxer's. She has to stay off the left side for the shoulder and right side for the back. She has a prior history of low back pain but this is worse then it has been.   Pt has been experiencing muscles spasms in low back and sciatica of new onset right before New Years. She saw PA on 1/3 and was prescribed muscle relaxers which have helped mildly. PA also sent a script for PT for this as well.  Plan to bring up concerns at next MD appt.   PERTINENT HISTORY:  DMT2, anemia, chronic neck pain  PAIN:  Are you having pain? No  PRECAUTIONS:  Shoulder  RED FLAGS: None   WEIGHT BEARING RESTRICTIONS:  Yes POSTOPERATIVE PLAN: She will follow the rotator cuff repair protocol.  She will be placed on aspirin  for blood clot prevention.   She will be seen by physical therapy postop.  She will be seen at 2 weeks postop for suture removal  FALLS:  Has patient fallen in last 6 months? 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    OBJECTIVE:  Note: Objective measures were completed at Evaluation unless otherwise noted.  PATIENT SURVEYS:  FOTO 13 FOTO:45% 12/9  COGNITIVE STATUS: Within functional limits for tasks assessed   SENSATION: WFL  EDEMA:  No  POSTURE:  Eval: forward rounded shoulder as expected in sling  HAND DOMINANCE:  Right     Body Part #1 Shoulder  PALPATION: Eval: very guarded motion through shoulder  UPPER EXTREMITY ROM:  Passive ROM Right/Lt Eval 10/18 Left 12/23 Left 1/7  Shoulder flexion 20 113! 142  Shoulder extension     Shoulder abduction 0 86   Shoulder adduction     Shoulder extension     Shoulder internal rotation  75 75  Shoulder external rotation -10 48 59  Elbow flexion full    Elbow extension -20     (Blank rows = not tested)    Active  ROM Right/Lt Eval 10/18 Left 12/23 Left 1/7  Shoulder flexion 20 113! 130  Shoulder extension     Shoulder abduction 0 86   Shoulder adduction     Shoulder extension     Shoulder internal rotation  75 Internal rotation to L5   Shoulder external rotation -10 48 Can reach behind her head   Elbow flexion full    Elbow extension -20     (Blank rows = not tested)     TREATMENT:  1/7   Assessed patients lumar spine: see assessment   Self trigger point release with lumbar spine.  Forward flexion stretch 5 x10 sec hold  Lateral stretch 5x5 sec hold   Reviewed shoulder exercises that she can use of rcore  Row red 2x15 with abdominal breathing  Shoulder extension  2x15 red with cuing for breathing   Manual: TP release to gluteal and lower lumbar spine LAD with grade I and II oscillations   PROM of her shoulder   Assessment of her shoulder   Review of HEP and POC      1/6 PROM all planes  Supine:   Cane flexion 2x12 2lb  ABC min cung for technique 1 lb   Standing:  Wall slides flexion and scaption- cues for full available range x10ea  Seated active flexion to 90deg 1# 2x10 Seated bicep curls 2# 2x10 Seated press out 1# 2x10 Seated ER- YTB 2x10  Pulleys flexion x65min   12/30 Manual: gentle PROM into flexion and ER; trigger point release to upper trap; gentle inferior and posterior glides  Supine:   Cane flexion 2x12 2lb  ABC min cung for technique 1 lb  Side lying :  ER 2x10   Standing:  Row 2x15  green Shoulder extension 2x15 green    12/26 Manual: gentle PROM into flexion and ER; trigger point release to upper trap; gentle inferior and posterior glides  Supine:  Cane press 2x10 2lb  Cane flexion 2x10 1lb then 2lb  ABC min cung for technique    Side lying :  ER 2x10  Abduction 2x10   Standing band:  Row 2x15  red  Shoulder extension 2x15 red     PATIENT EDUCATION:  Education details: Teacher, Music of condition, POC, HEP, exercise form/rationale Person educated: Patient Education method: Explanation, Demonstration, Tactile cues, Verbal cues, and Handouts Education comprehension: verbalized understanding, returned demonstration, verbal cues required, tactile cues required, and needs further education  HOME EXERCISE PROGRAM: [id number]   ASSESSMENT:  CLINICAL IMPRESSION:  Assessment of patient's back performed today.  Signs and symptoms are consistent with spinal stenosis.  She has increased pain with extension.  She has an pain with end range flexion.  She also has pain with left and right rotation.  She has significant spasming in her right lumbar paraspinal and right gluteal.  She has no significant leg  weakness at this time.  Therapy perform manual therapy including trigger point release and LAD with oscillations to reduce acute inflammation in the leg.  We discussed centralization of symptoms with the patient.  We reviewed core exercises that can be also used for her shoulder.  We also perform reassessment on her shoulder today.  She is making great progress.  She is able to reach behind her head without pain which is one of her goals.  She is still limited in internal rotation behind her back.  Her active flexion is improving significantly.  Her pain with functional activities is improving.  She would benefit from further skilled therapy 2W6.  REHAB POTENTIAL: Good  CLINICAL DECISION MAKING: Stable/uncomplicated  EVALUATION COMPLEXITY: Low   GOALS: Goals reviewed with patient? Yes  SHORT TERM GOALS: Target date: 4 wks (08/04/23)  Passive flexion to 140 deg without increase in pain Baseline: Goal status:achieved 12/30   2.  Passive abd to 60 deg without increase in pain Baseline:  Goal status: 60 achieved 11/11  3.  Passive ER at side to 40 deg without increase in pain Baseline:  Goal status  18 11/11   4.  Able to demo proper scapular retraction for proximal shoulder girdle support Baseline:  Goal status: progressing 12/2   5.  Patient will report a 50% reduction in intensity and duration of radicular pain  Initial  6. Patient will increase lumbar flexion and extension ROM to full  LONG TERM GOALS: Target date: POC DATE  Able to demo AROM to shoulder height, against gravity, with good scapular control Baseline:  Goal status: INITIAL  Target date:09/04/23 8 weeks has not begn active motion 12/2   2.  Will begin light resistance exercises pain <=3/10 Baseline:  Goal status: Initiated today with minor pain 12/18  Target date:  8 weeks  3.  Full AROM within 10 deg of opp UE without increase in pain Baseline:  Goal status: INITIAL Target date: 10/02/23 12 weeks  continues to be limited in active 12/26   4.  Proper scapular control in proprioceptive and CKC strengthening Baseline:  Goal status: INITIAL Target date: 10/03/23  12 weeks  5.  Able to lift cups/plates and other small objects into overhead cabinets without limitation by shoulder Baseline:  Goal status: INITIAL Target date: 10/03/23  12 weeks  6. Patient will stand without out radicular pain for 30 min without pain.  Initial    PLAN:  PT FREQUENCY: 1-2x/week  PT DURATION: 12 weeks  PLANNED INTERVENTIONS: 97164- PT Re-evaluation, 97110-Therapeutic exercises, 97530- Therapeutic activity, [id number]- Neuromuscular re-education, 97535- Self Care, 02859- Manual therapy, [id number]- Aquatic Therapy, 97014- Electrical stimulation (unattended), [phone]- Traction (mechanical), Patient/Family education, Taping, Dry Needling, Joint mobilization, Spinal mobilization, Scar mobilization, Cryotherapy, and Moist heat.  PLAN FOR NEXT SESSION: continue PROM per protocol; consider manual therapy to lumbar spine. Progress core strengthening exercises.   Alm Don PT     09/30/23 12:58 PM

## 2023-09-30 ENCOUNTER — Ambulatory Visit (HOSPITAL_BASED_OUTPATIENT_CLINIC_OR_DEPARTMENT_OTHER): Payer: 59 | Admitting: Physical Therapy

## 2023-10-05 ENCOUNTER — Ambulatory Visit (HOSPITAL_BASED_OUTPATIENT_CLINIC_OR_DEPARTMENT_OTHER): Payer: Self-pay

## 2023-10-05 ENCOUNTER — Encounter (HOSPITAL_BASED_OUTPATIENT_CLINIC_OR_DEPARTMENT_OTHER): Payer: Self-pay

## 2023-10-05 DIAGNOSIS — M6281 Muscle weakness (generalized): Secondary | ICD-10-CM | POA: Diagnosis not present

## 2023-10-05 DIAGNOSIS — G8929 Other chronic pain: Secondary | ICD-10-CM | POA: Diagnosis not present

## 2023-10-05 DIAGNOSIS — M5441 Lumbago with sciatica, right side: Secondary | ICD-10-CM | POA: Diagnosis not present

## 2023-10-05 DIAGNOSIS — M25512 Pain in left shoulder: Secondary | ICD-10-CM | POA: Diagnosis not present

## 2023-10-05 DIAGNOSIS — M25612 Stiffness of left shoulder, not elsewhere classified: Secondary | ICD-10-CM | POA: Diagnosis not present

## 2023-10-05 NOTE — Therapy (Signed)
 OUTPATIENT PHYSICAL THERAPY TREATMENT   Patient Name: Cindy Carson MRN: [id number] DOB:Jan 21, 1971, 53 y.o., female Today's Date: 10/05/2023  END OF SESSION:  PT End of Session - 10/05/23 0911     Visit Number 21    Number of Visits 25    Date for PT Re-Evaluation 11/25/23    Authorization Type UMR    Authorization Time Period 08/25/22 to 10/20/22    PT Start Time 0851    PT Stop Time 0930    PT Time Calculation (min) 39 min    Activity Tolerance Patient tolerated treatment well    Behavior During Therapy [facility] for tasks assessed/performed                         Past Medical History:  Diagnosis Date   Anemia 12/09/2018   Chronic low back pain    Chronic neck pain    Diabetes type 2, uncontrolled    diagnosed in 2019   Hyperlipidemia 08/01/2021   Hypertension complicating diabetes (HCC)    Neck pain 07/11/2021   Obesity 12/09/2018   OSA (obstructive sleep apnea)    sleep study done in [place] in 2019 per pt   Rash 01/28/2022   Screening for colon cancer 08/01/2021   Past Surgical History:  Procedure Laterality Date   COLONOSCOPY     COLONOSCOPY N/A 09/03/2021   Procedure: COLONOSCOPY;  Surgeon: Jinny Carmine, MD;  Location: [facility] ENDOSCOPY;  Service: Endoscopy;  Laterality: N/A;   SHOULDER ARTHROSCOPY WITH ROTATOR CUFF REPAIR Left 07/07/2023   Procedure: LEFT SHOULDER ARTHROSCOPY / DEBRIDEMENT WITH ROTATOR CUFF REPAIR;  Surgeon: Genelle Standing, MD;  Location: [facility];  Service: Orthopedics;  Laterality: Left;   TUBAL LIGATION     Patient Active Problem List   Diagnosis Date Noted   Nontraumatic complete tear of left rotator cuff 07/07/2023   Type 2 diabetes mellitus with hyperglycemia, without long-term current use of insulin  (HCC) 07/25/2022   Depression, recurrent (HCC) 07/25/2022   Encounter for screening for malignant neoplasm of breast 07/11/2021   Hyperlipidemia associated with type 2 diabetes mellitus (HCC) 07/11/2021    Personal history of noncompliance with medical treatment, presenting hazards to health 02/15/2019   History of syphilis 12/09/2018   Bursitis of hip 12/09/2018   Anemia 12/09/2018   Obesity 12/09/2018   OSA (obstructive sleep apnea)    HTN (hypertension)    Hypertension complicating diabetes (HCC)    Chronic low back pain    Chronic neck pain      REFERRING PROVIDER:  Genelle Standing, MD    REFERRING DIAG: [phone] (ICD-10-CM) - Nontraumatic complete tear of left rotator cuff  S/p Lt RCR  Rationale for Evaluation and Treatment: Rehabilitation  THERAPY DIAG:  Muscle weakness (generalized)  Acute pain of left shoulder  Chronic left shoulder pain  Stiffness of left shoulder, not elsewhere classified  ONSET DATE: DOS 07/07/23   Days since surgery: 90  SUBJECTIVE:  SUBJECTIVE STATEMENT: On New Years day the patient had an acute onset of right sided lower back pain that radiates down to her foot. She had acute spasming but that has resolved since she started taking the muscle relaxer's. She has to stay off the left side for the shoulder and right side for the back. She has a prior history of low back pain but this is worse then it has been.   Pt has been experiencing muscles spasms in low back and sciatica of new onset right before New Years. She saw PA on 1/3 and was prescribed muscle relaxers which have helped mildly. PA also sent a script for PT for this as well.  Plan to bring up concerns at next MD appt.   PERTINENT HISTORY:  DMT2, anemia, chronic neck pain  PAIN:  Are you having pain? PAIN:  Are you having pain? Yes: NPRS scale: 2/10  Pain location: LBP and L shoulder Pain description: Tightness Aggravating factors: Laying on L shoulder. Standing bothers low back Relieving factors:  Heat, muscle relaxers    PRECAUTIONS:  Shoulder  RED FLAGS: None   WEIGHT BEARING RESTRICTIONS:  Yes POSTOPERATIVE PLAN: She will follow the rotator cuff repair protocol.  She will be placed on aspirin  for blood clot prevention.  She will be seen by physical therapy postop.  She will be seen at 2 weeks postop for suture removal  FALLS:  Has patient fallen in last 6 months? 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    OBJECTIVE:  Note: Objective measures were completed at Evaluation unless otherwise noted.  PATIENT SURVEYS:  FOTO 13 FOTO:45% 12/9  COGNITIVE STATUS: Within functional limits for tasks assessed   SENSATION: WFL  EDEMA:  No  POSTURE:  Eval: forward rounded shoulder as expected in sling  HAND DOMINANCE:  Right     Body Part #1 Shoulder  PALPATION: Eval: very guarded motion through shoulder  UPPER EXTREMITY ROM:  Passive ROM Right/Lt Eval 10/18 Left 12/23 Left 1/7  Shoulder flexion 20 113! 142  Shoulder extension     Shoulder abduction 0 86   Shoulder adduction     Shoulder extension     Shoulder internal rotation  75 75  Shoulder external rotation -10 48 59  Elbow flexion full    Elbow extension -20     (Blank rows = not tested)    Active  ROM Right/Lt Eval 10/18 Left 12/23 Left 1/7  Shoulder flexion 20 113! 130  Shoulder extension     Shoulder abduction 0 86   Shoulder adduction     Shoulder extension     Shoulder internal rotation  75 Internal rotation to L5   Shoulder external rotation -10 48 Can reach behind her head   Elbow flexion full    Elbow extension -20     (Blank rows = not tested)     TREATMENT:  1/13 LTR 5 x10ea Piriformis stretching 30sec x2  ea PROM L shoulder Supine active flexion 1# 2x10 Sidelying ER (trialed 2#, but too heavy) 1# 2x10 Sidelying abduction 0#x01, 1# x10 Seated lumbar flexion 15sec x3 Doorway stretch 20sec x3 TB row GTB x20 TB ext RTB x20 Bil ER YTB 2x10 Ball rolls at wall 4 way 1.1# ball x2 Standing flexion to 90 1# x10 Standing abduction to 90 2# x10        1/7   Assessed patients lumar spine: see assessment   Self trigger point release with lumbar spine.  Forward flexion stretch 5 x10 sec hold  Lateral stretch 5x5 sec hold   Reviewed shoulder exercises that she can use of rcore  Row red 2x15 with abdominal breathing  Shoulder extension 2x15 red with cuing for breathing   Manual: TP release to gluteal and lower lumbar spine LAD with grade I and II oscillations   PROM of her shoulder   Assessment of her shoulder   Review of HEP and POC      1/6 PROM all planes  Supine:   Cane flexion 2x12 2lb  ABC min cung for technique 1 lb   Standing:  Wall slides flexion and scaption- cues for full available range x10ea  Seated active flexion to 90deg 1# 2x10 Seated bicep curls 2# 2x10 Seated press out 1# 2x10 Seated ER- YTB 2x10  Pulleys flexion x75min   12/30 Manual: gentle PROM into flexion and ER; trigger point release to upper trap; gentle inferior and posterior glides  Supine:   Cane flexion 2x12 2lb  ABC min cung for technique 1 lb  Side lying :  ER 2x10   Standing:  Row 2x15  green Shoulder extension 2x15 green    12/26 Manual: gentle PROM into flexion and ER; trigger point release to upper trap; gentle inferior and posterior glides  Supine:  Cane press 2x10 2lb  Cane flexion 2x10 1lb then 2lb  ABC min cung for technique    Side lying :  ER 2x10  Abduction 2x10   Standing band:  Row 2x15  red  Shoulder extension 2x15 red     PATIENT EDUCATION:  Education details: Teacher, Music of condition, POC, HEP, exercise form/rationale Person educated:  Patient Education method: Explanation, Demonstration, Tactile cues, Verbal cues, and Handouts Education comprehension: verbalized understanding, returned demonstration, verbal cues required, tactile cues required, and needs further education  HOME EXERCISE PROGRAM: [id number]   ASSESSMENT:  CLINICAL IMPRESSION:  Pt presents with improved low back pain and decreased spasm. Good tolerance for gentle lumbar stretching. Pt does note tightness in anterior L shoulder, which did improve following pec stretch. Pt demonstrates weakness in L UE with AROM strengthening, though overall good tolerance for light resistance.   REHAB POTENTIAL: Good  CLINICAL DECISION MAKING: Stable/uncomplicated  EVALUATION COMPLEXITY: Low   GOALS: Goals reviewed with patient? Yes  SHORT TERM GOALS: Target date: 4 wks (08/04/23)  Passive flexion to 140 deg without increase in pain Baseline: Goal status:achieved 12/30   2.  Passive abd to 60 deg without increase in pain Baseline:  Goal status: 60 achieved 11/11  3.  Passive ER at side to 40 deg without increase in pain Baseline:  Goal status 18 11/11   4.  Able to demo proper scapular retraction for proximal shoulder girdle support Baseline:  Goal status: progressing 12/2   5.  Patient will report a 50% reduction in intensity and duration of radicular pain  Initial  6. Patient will  increase lumbar flexion and extension ROM to full  LONG TERM GOALS: Target date: POC DATE  Able to demo AROM to shoulder height, against gravity, with good scapular control Baseline:  Goal status: INITIAL  Target date:09/04/23 8 weeks has not begn active motion 12/2   2.  Will begin light resistance exercises pain <=3/10 Baseline:  Goal status: Initiated today with minor pain 12/18  Target date:  8 weeks  3.  Full AROM within 10 deg of opp UE without increase in pain Baseline:  Goal status: INITIAL Target date: 10/02/23 12 weeks continues to be limited in active  12/26   4.  Proper scapular control in proprioceptive and CKC strengthening Baseline:  Goal status: INITIAL Target date: 10/03/23  12 weeks  5.  Able to lift cups/plates and other small objects into overhead cabinets without limitation by shoulder Baseline:  Goal status: INITIAL Target date: 10/03/23  12 weeks  6. Patient will stand without out radicular pain for 30 min without pain.  Initial    PLAN:  PT FREQUENCY: 1-2x/week  PT DURATION: 12 weeks  PLANNED INTERVENTIONS: 97164- PT Re-evaluation, 97110-Therapeutic exercises, 97530- Therapeutic activity, [id number]- Neuromuscular re-education, 97535- Self Care, [id number]- Manual therapy, [id number]- Aquatic Therapy, 97014- Electrical stimulation (unattended), [phone]- Traction (mechanical), Patient/Family education, Taping, Dry Needling, Joint mobilization, Spinal mobilization, Scar mobilization, Cryotherapy, and Moist heat.  PLAN FOR NEXT SESSION: continue PROM per protocol; consider manual therapy to lumbar spine. Progress core strengthening exercises.   Asberry Rodes, PTA      10/05/23 9:49 AM

## 2023-10-07 ENCOUNTER — Ambulatory Visit (HOSPITAL_BASED_OUTPATIENT_CLINIC_OR_DEPARTMENT_OTHER): Payer: 59 | Admitting: Orthopaedic Surgery

## 2023-10-07 DIAGNOSIS — M75122 Complete rotator cuff tear or rupture of left shoulder, not specified as traumatic: Secondary | ICD-10-CM

## 2023-10-07 NOTE — Progress Notes (Signed)
 Post Operative Evaluation    Procedure/Date of Surgery: Left shoulder rotator cuff repair with collagen patch augmentation 10/15  Interval History:   12 weeks status post left shoulder rotator cuff repair doing extremely well.  At this time she continues to have improved of overhead range of motion with strength.  Denies any pain and is doing quite well  PMH/PSH/Family History/Social History/Meds/Allergies:    Past Medical History:  Diagnosis Date   Anemia 12/09/2018   Chronic low back pain    Chronic neck pain    Diabetes type 2, uncontrolled    diagnosed in 2019   Hyperlipidemia 08/01/2021   Hypertension complicating diabetes (HCC)    Neck pain 07/11/2021   Obesity 12/09/2018   OSA (obstructive sleep apnea)    sleep study done in [place] in 2019 per pt   Rash 01/28/2022   Screening for colon cancer 08/01/2021   Past Surgical History:  Procedure Laterality Date   COLONOSCOPY     COLONOSCOPY N/A 09/03/2021   Procedure: COLONOSCOPY;  Surgeon: Marnee Sink, MD;  Location: [facility] ENDOSCOPY;  Service: Endoscopy;  Laterality: N/A;   SHOULDER ARTHROSCOPY WITH ROTATOR CUFF REPAIR Left 07/07/2023   Procedure: LEFT SHOULDER ARTHROSCOPY / DEBRIDEMENT WITH ROTATOR CUFF REPAIR;  Surgeon: Wilhelmenia Harada, MD;  Location: [facility];  Service: Orthopedics;  Laterality: Left;   TUBAL LIGATION     Social History   Socioeconomic History   Marital status: Legally Separated    Spouse name: Not on file   Number of children: Not on file   Years of education: Not on file   Highest education level: Associate degree: academic program  Occupational History   Not on file  Tobacco Use   Smoking status: Never   Smokeless tobacco: Never  Vaping Use   Vaping status: Never Used  Substance and Sexual Activity   Alcohol use: Never   Drug use: Never   Sexual activity: Not Currently    Partners: Male    Birth control/protection: Surgical,  Post-menopausal    Comment: BTL, First IC <16 y/o, >5 Partners, Hx of RPR, GC  Other Topics Concern   Not on file  Social History Narrative   Not on file   Social Drivers of Health   Financial Resource Strain: Low Risk  (08/11/2023)   Overall Financial Resource Strain (CARDIA)    Difficulty of Paying Living Expenses: Not very hard  Food Insecurity: Food Insecurity Present (08/11/2023)   Hunger Vital Sign    Worried About Running Out of Food in the Last Year: Sometimes true    Ran Out of Food in the Last Year: Sometimes true  Transportation Needs: No Transportation Needs (08/11/2023)   PRAPARE - Administrator, Civil Service (Medical): No    Lack of Transportation (Non-Medical): No  Physical Activity: Insufficiently Active (08/11/2023)   Exercise Vital Sign    Days of Exercise per Week: 2 days    Minutes of Exercise per Session: 10 min  Stress: No Stress Concern Present (08/11/2023)   [employer] of Occupational Health - Occupational Stress Questionnaire    Feeling of Stress : Only a little  Social Connections: Socially Isolated (08/11/2023)   Social Connection and Isolation Panel [NHANES]    Frequency of Communication with Friends and Family: More than three times a week  Frequency of Social Gatherings with Friends and Family: Once a week    Attends Religious Services: Never    Database administrator or Organizations: No    Attends Engineer, structural: Not on file    Marital Status: Separated   Family History  Problem Relation Age of Onset   Varicose Veins Father    Hypertension Sister    Diabetes Sister    Diabetes Sister    Heart murmur Brother    Cancer Maternal Grandmother    Allergies  Allergen Reactions   Nsaids Nausea Only    nausea   Tape Rash    Surgical tape    Current Outpatient Medications  Medication Sig Dispense Refill   amLODipine  (NORVASC ) 5 MG tablet Take 1 tablet by mouth once daily 90 tablet 1   atorvastatin   (LIPITOR) 20 MG tablet Take 1 tablet by mouth once daily 90 tablet 1   Blood Glucose Monitoring Suppl (BLOOD GLUCOSE MONITOR SYSTEM) w/Device KIT 1 each by Does not apply route in the morning, at noon, and at bedtime. 1 kit 0   DULoxetine  (CYMBALTA ) 30 MG capsule Take 1 capsule (30 mg total) by mouth daily. 90 capsule 1   glipiZIDE  (GLUCOTROL ) 5 MG tablet Take 1 tablet (5 mg total) by mouth 2 (two) times daily before a meal. 180 tablet 1   Glucose Blood (BLOOD GLUCOSE TEST STRIPS) STRP For monitoring blood sugar levels up to 4 times a day. May substitute to any manufacturer covered by patient's insurance. 200 strip 99   insulin  glargine-yfgn (SEMGLEE , YFGN,) 100 UNIT/ML Pen Inject 10 Units into the skin at bedtime. Take 10 UNITS nightly, may go up by 3 UNITS every 3 days if morning sugars are greater than 130 in increments of (13 UNITS, 16 UNITS, 19 UNITS, 22 UNITS, 25 UNITS every 3 days based on morning sugars) do not go higher than 25 UNITS. 15 mL 2   Insulin  Pen Needle (UNIFINE PENTIPS) 31G X 6 MM MISC Use daily with insulin  100 each 1   Lancet Device MISC For monitoring blood sugar up to 4 times a day. May substitute to any manufacturer covered by patient's insurance. 200 each 99   Lancets (FREESTYLE) lancets Test 4x daily 200 each 11   Semaglutide , 1 MG/DOSE, 4 MG/3ML SOPN Inject 1 mg into the skin once a week. 6 mL 1   No current facility-administered medications for this visit.   No results found.  Review of Systems:   A ROS was performed including pertinent positives and negatives as documented in the HPI.   Musculoskeletal Exam:     Left shoulder is well-appearing incisions are without erythema or drainage.  Active forward elevation is to 80 degrees in the standing position..  External rotation at the side is to 40 degrees.  Internal rotation deferred today.  Distal neurosensory exam is intact  Imaging:      I personally reviewed and interpreted the radiographs.   Assessment:    12 weeks status post left shoulder rotator cuff repair overall doing well.  Active range of motion and strengthening is coming quite nicely.  At this time she may return to work.  I will plan to see her back in 12 weeks for reassessment and likely final check  Plan :    -Return to clinic 12 weeks for reassessment      I personally saw and evaluated the patient, and participated in the management and treatment plan.  Wilhelmenia Harada, MD Attending  Physician, Orthopedic Surgery  This document was dictated using Dragon voice recognition software. A reasonable attempt at proof reading has been made to minimize errors.

## 2023-10-09 ENCOUNTER — Encounter (HOSPITAL_BASED_OUTPATIENT_CLINIC_OR_DEPARTMENT_OTHER): Payer: Self-pay

## 2023-10-09 ENCOUNTER — Ambulatory Visit (HOSPITAL_BASED_OUTPATIENT_CLINIC_OR_DEPARTMENT_OTHER): Payer: 59

## 2023-10-09 DIAGNOSIS — G8929 Other chronic pain: Secondary | ICD-10-CM | POA: Diagnosis not present

## 2023-10-09 DIAGNOSIS — M25512 Pain in left shoulder: Secondary | ICD-10-CM | POA: Diagnosis not present

## 2023-10-09 DIAGNOSIS — M25612 Stiffness of left shoulder, not elsewhere classified: Secondary | ICD-10-CM | POA: Diagnosis not present

## 2023-10-09 DIAGNOSIS — M6281 Muscle weakness (generalized): Secondary | ICD-10-CM

## 2023-10-09 DIAGNOSIS — M5441 Lumbago with sciatica, right side: Secondary | ICD-10-CM | POA: Diagnosis not present

## 2023-10-09 NOTE — Therapy (Signed)
OUTPATIENT PHYSICAL THERAPY TREATMENT   Patient Name: Cindy Carson MRN: [id number] DOB:17-Apr-1971, 53 y.o., female Today's Date: 10/09/2023  END OF SESSION:  PT End of Session - 10/09/23 1019     Visit Number 22    Number of Visits 25    Date for PT Re-Evaluation 11/25/23    Authorization Type UMR    Authorization Time Period 08/25/22 to 10/20/22    PT Start Time 1019    PT Stop Time 1102    PT Time Calculation (min) 43 min    Activity Tolerance Patient tolerated treatment well    Behavior During Therapy [facility] for tasks assessed/performed                          Past Medical History:  Diagnosis Date   Anemia 12/09/2018   Chronic low back pain    Chronic neck pain    Diabetes type 2, uncontrolled    diagnosed in 2019   Hyperlipidemia 08/01/2021   Hypertension complicating diabetes (HCC)    Neck pain 07/11/2021   Obesity 12/09/2018   OSA (obstructive sleep apnea)    sleep study done in [place] in 2019 per pt   Rash 01/28/2022   Screening for colon cancer 08/01/2021   Past Surgical History:  Procedure Laterality Date   COLONOSCOPY     COLONOSCOPY N/A 09/03/2021   Procedure: COLONOSCOPY;  Surgeon: Midge Minium, MD;  Location: [facility] ENDOSCOPY;  Service: Endoscopy;  Laterality: N/A;   SHOULDER ARTHROSCOPY WITH ROTATOR CUFF REPAIR Left 07/07/2023   Procedure: LEFT SHOULDER ARTHROSCOPY / DEBRIDEMENT WITH ROTATOR CUFF REPAIR;  Surgeon: Huel Cote, MD;  Location: [facility];  Service: Orthopedics;  Laterality: Left;   TUBAL LIGATION     Patient Active Problem List   Diagnosis Date Noted   Nontraumatic complete tear of left rotator cuff 07/07/2023   Type 2 diabetes mellitus with hyperglycemia, without long-term current use of insulin (HCC) 07/25/2022   Depression, recurrent (HCC) 07/25/2022   Encounter for screening for malignant neoplasm of breast 07/11/2021   Hyperlipidemia associated with type 2 diabetes mellitus (HCC) 07/11/2021    Personal history of noncompliance with medical treatment, presenting hazards to health 02/15/2019   History of syphilis 12/09/2018   Bursitis of hip 12/09/2018   Anemia 12/09/2018   Obesity 12/09/2018   OSA (obstructive sleep apnea)    HTN (hypertension)    Hypertension complicating diabetes (HCC)    Chronic low back pain    Chronic neck pain      REFERRING PROVIDER:  Huel Cote, MD    REFERRING DIAG: [phone] (ICD-10-CM) - Nontraumatic complete tear of left rotator cuff  S/p Lt RCR  Rationale for Evaluation and Treatment: Rehabilitation  THERAPY DIAG:  Muscle weakness (generalized)  Acute pain of left shoulder  Chronic left shoulder pain  ONSET DATE: DOS 07/07/23   Days since surgery: 94  SUBJECTIVE:  SUBJECTIVE STATEMENT: Pt reports no pain in L shoulder at entry. 2/10 pain in low back and is overall improving. Pt going back to work on Monday.  Pt has been experiencing muscles spasms in low back and sciatica of new onset right before New Years. She saw PA on 1/3 and was prescribed muscle relaxers which have helped mildly. PA also sent a script for PT for this as well.  Plan to bring up concerns at next MD appt.   PERTINENT HISTORY:  DMT2, anemia, chronic neck pain  PAIN:  Are you having pain? PAIN:  Are you having pain? Yes: NPRS scale: 2/10  Pain location: LBP and L shoulder Pain description: Tightness Aggravating factors: Laying on L shoulder. Standing bothers low back Relieving factors: Heat, muscle relaxers    PRECAUTIONS:  Shoulder  RED FLAGS: None   WEIGHT BEARING RESTRICTIONS:  Yes POSTOPERATIVE PLAN: She will follow the rotator cuff repair protocol.  She will be placed on aspirin for blood clot prevention.  She will be seen by physical therapy postop.  She will be  seen at 2 weeks postop for suture removal  FALLS:  Has patient fallen in last 6 months? 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    OBJECTIVE:  Note: Objective measures were completed at Evaluation unless otherwise noted.  PATIENT SURVEYS:  FOTO 13 FOTO:45% 12/9  COGNITIVE STATUS: Within functional limits for tasks assessed   SENSATION: WFL  EDEMA:  No  POSTURE:  Eval: forward rounded shoulder as expected in sling  HAND DOMINANCE:  Right     Body Part #1 Shoulder  PALPATION: Eval: very guarded motion through shoulder  UPPER EXTREMITY ROM:  Passive ROM Right/Lt Eval 10/18 Left 12/23 Left 1/7  Shoulder flexion 20 113! 142  Shoulder extension     Shoulder abduction 0 86   Shoulder adduction     Shoulder extension     Shoulder internal rotation  75 75  Shoulder external rotation -10 48 59  Elbow flexion full    Elbow extension -20     (Blank rows = not tested)    Active  ROM Right/Lt Eval 10/18 Left 12/23 Left 1/7  Shoulder flexion 20 113! 130  Shoulder extension     Shoulder abduction 0 86   Shoulder adduction     Shoulder extension     Shoulder internal rotation  75 Internal rotation to L5   Shoulder external rotation -10 48 Can reach behind her head   Elbow flexion full    Elbow extension -20     (Blank rows = not tested)     TREATMENT:  1/17  PROM L shoulder PPT 2x10 5" hold Supine active flexion 1# 3x10 Sidelying ER  1# 3x10 Sidelying abduction 1# 3x10 Seated lumbar flexion 15sec x4 TB row GTB 2x15 TB ext GTB 2x15 Bil ER RTB 3x10 Standing flexion full range 2x10 Standing abduction full range 2x10 1/13 LTR 5" x10ea Piriformis stretching 30sec x2 ea PROM L  shoulder Supine active flexion 1# 2x10 Sidelying ER (trialed 2#, but too heavy) 1# 2x10 Sidelying abduction 0#x01, 1# x10 Seated lumbar flexion 15sec x3 Doorway stretch 20sec x3 TB row GTB x20 TB ext RTB x20 Bil ER YTB 2x10 Ball rolls at wall 4 way 1.1# ball x2 Standing flexion to 90 1# x10 Standing abduction to 90 2# x10        1/7   Assessed patients lumar spine: see assessment   Self trigger point release with lumbar spine.  Forward flexion stretch 5 x10 sec hold  Lateral stretch 5x5 sec hold   Reviewed shoulder exercises that she can use of rcore  Row red 2x15 with abdominal breathing  Shoulder extension 2x15 red with cuing for breathing   Manual: TP release to gluteal and lower lumbar spine LAD with grade I and II oscillations   PROM of her shoulder   Assessment of her shoulder   Review of HEP and POC      1/6 PROM all planes  Supine:   Cane flexion 2x12 2lb  ABC min cung for technique 1 lb   Standing:  Wall slides flexion and scaption- cues for full available range x10ea  Seated active flexion to 90deg 1# 2x10 Seated bicep curls 2# 2x10 Seated press out 1# 2x10 Seated ER- YTB 2x10  Pulleys flexion x48min   PATIENT EDUCATION:  Education details: Teacher, music of condition, POC, HEP, exercise form/rationale Person educated: Patient Education method: Explanation, Demonstration, Tactile cues, Verbal cues, and Handouts Education comprehension: verbalized understanding, returned demonstration, verbal cues required, tactile cues required, and needs further education  HOME EXERCISE PROGRAM: [email or web address]   ASSESSMENT:  CLINICAL IMPRESSION:  Improving with available ROM in end ranges, though still some mild tightness. Ale to increase reps with strengthening on plinth. With standing AROM, she is able to complete near full range without shoulder hiking. Will benefit from continued strengthening. Reviewed posture and stretches she can perform when she  returns to work on Monday for low back. Will progress as tolerated towards goals.   REHAB POTENTIAL: Good  CLINICAL DECISION MAKING: Stable/uncomplicated  EVALUATION COMPLEXITY: Low   GOALS: Goals reviewed with patient? Yes  SHORT TERM GOALS: Target date: 4 wks (08/04/23)  Passive flexion to 140 deg without increase in pain Baseline: Goal status:achieved 12/30   2.  Passive abd to 60 deg without increase in pain Baseline:  Goal status: 60 achieved 11/11  3.  Passive ER at side to 40 deg without increase in pain Baseline:  Goal status 18 11/11   4.  Able to demo proper scapular retraction for proximal shoulder girdle support Baseline:  Goal status: progressing 12/2   5.  Patient will report a 50% reduction in intensity and duration of radicular pain  Initial  6. Patient will increase lumbar flexion and extension ROM to full  LONG TERM GOALS: Target date: POC DATE  Able to demo AROM to shoulder height, against gravity, with good scapular control Baseline:  Goal status: IN PROGRESS  Target date:09/04/23 8 weeks has not begn active motion 12/2   2.  Will begin light resistance exercises pain <=3/10 Baseline:  Goal status: Initiated today with minor pain 12/18  Target date:  8 weeks  3.  Full AROM within 10 deg of opp UE without increase in pain Baseline:  Goal status: IN PROGRESS Target date: 10/02/23 12 weeks continues to be limited in active 12/26   4.  Proper scapular control in proprioceptive and CKC strengthening Baseline:  Goal status: INITIAL Target date: 10/03/23  12 weeks  5.  Able to lift cups/plates and other small objects into overhead cabinets without limitation by shoulder Baseline:  Goal status: IN PROGRESS Target date: 10/03/23  12 weeks  6. Patient will stand without out radicular pain for 30 min without pain.  IN PROGRESS   PLAN:  PT FREQUENCY: 1-2x/week  PT DURATION: 12 weeks  PLANNED INTERVENTIONS: 97164- PT Re-evaluation,  97110-Therapeutic exercises, 97530- Therapeutic activity, [id number]- Neuromuscular re-education, 97535- Self Care, [id number]- Manual therapy, [id number]- Aquatic Therapy, 97014- Electrical stimulation (unattended), [phone]- Traction (mechanical), Patient/Family education, Taping, Dry Needling, Joint mobilization, Spinal mobilization, Scar mobilization, Cryotherapy, and Moist heat.  PLAN FOR NEXT SESSION: continue PROM per protocol; consider manual therapy to lumbar spine. Progress core strengthening exercises.   Riki Altes, PTA      10/09/23 11:37 AM

## 2023-10-14 ENCOUNTER — Telehealth: Payer: Self-pay | Admitting: Orthopaedic Surgery

## 2023-10-14 NOTE — Telephone Encounter (Signed)
[employer] forms received. To [employer]. 

## 2023-10-16 ENCOUNTER — Encounter (HOSPITAL_BASED_OUTPATIENT_CLINIC_OR_DEPARTMENT_OTHER): Payer: Self-pay

## 2023-10-16 ENCOUNTER — Ambulatory Visit (HOSPITAL_BASED_OUTPATIENT_CLINIC_OR_DEPARTMENT_OTHER): Payer: 59

## 2023-10-16 DIAGNOSIS — M5441 Lumbago with sciatica, right side: Secondary | ICD-10-CM | POA: Diagnosis not present

## 2023-10-16 DIAGNOSIS — M6281 Muscle weakness (generalized): Secondary | ICD-10-CM

## 2023-10-16 DIAGNOSIS — M25612 Stiffness of left shoulder, not elsewhere classified: Secondary | ICD-10-CM | POA: Diagnosis not present

## 2023-10-16 DIAGNOSIS — G8929 Other chronic pain: Secondary | ICD-10-CM | POA: Diagnosis not present

## 2023-10-16 DIAGNOSIS — M25512 Pain in left shoulder: Secondary | ICD-10-CM

## 2023-10-16 NOTE — Therapy (Signed)
OUTPATIENT PHYSICAL THERAPY TREATMENT   Patient Name: Cindy Carson MRN: [id number] DOB:1970-11-23, 53 y.o., female Today's Date: 10/16/2023  END OF SESSION:  PT End of Session - 10/16/23 1016     Visit Number 23    Number of Visits 25    Authorization Type UMR    Authorization Time Period 08/25/22 to 10/20/22    PT Start Time 1019    PT Stop Time 1100    PT Time Calculation (min) 41 min    Activity Tolerance Patient tolerated treatment well    Behavior During Therapy [facility] for tasks assessed/performed                          Past Medical History:  Diagnosis Date   Anemia 12/09/2018   Chronic low back pain    Chronic neck pain    Diabetes type 2, uncontrolled    diagnosed in 2019   Hyperlipidemia 08/01/2021   Hypertension complicating diabetes (HCC)    Neck pain 07/11/2021   Obesity 12/09/2018   OSA (obstructive sleep apnea)    sleep study done in [place] in 2019 per pt   Rash 01/28/2022   Screening for colon cancer 08/01/2021   Past Surgical History:  Procedure Laterality Date   COLONOSCOPY     COLONOSCOPY N/A 09/03/2021   Procedure: COLONOSCOPY;  Surgeon: Midge Minium, MD;  Location: [facility] ENDOSCOPY;  Service: Endoscopy;  Laterality: N/A;   SHOULDER ARTHROSCOPY WITH ROTATOR CUFF REPAIR Left 07/07/2023   Procedure: LEFT SHOULDER ARTHROSCOPY / DEBRIDEMENT WITH ROTATOR CUFF REPAIR;  Surgeon: Huel Cote, MD;  Location: [facility];  Service: Orthopedics;  Laterality: Left;   TUBAL LIGATION     Patient Active Problem List   Diagnosis Date Noted   Nontraumatic complete tear of left rotator cuff 07/07/2023   Type 2 diabetes mellitus with hyperglycemia, without long-term current use of insulin (HCC) 07/25/2022   Depression, recurrent (HCC) 07/25/2022   Encounter for screening for malignant neoplasm of breast 07/11/2021   Hyperlipidemia associated with type 2 diabetes mellitus (HCC) 07/11/2021   Personal history of noncompliance with  medical treatment, presenting hazards to health 02/15/2019   History of syphilis 12/09/2018   Bursitis of hip 12/09/2018   Anemia 12/09/2018   Obesity 12/09/2018   OSA (obstructive sleep apnea)    HTN (hypertension)    Hypertension complicating diabetes (HCC)    Chronic low back pain    Chronic neck pain      REFERRING PROVIDER:  Huel Cote, MD    REFERRING DIAG: [phone] (ICD-10-CM) - Nontraumatic complete tear of left rotator cuff  S/p Lt RCR  Rationale for Evaluation and Treatment: Rehabilitation  THERAPY DIAG:  Muscle weakness (generalized)  Chronic left shoulder pain  Acute pain of left shoulder  ONSET DATE: DOS 07/07/23   Days since surgery: 101  SUBJECTIVE:  SUBJECTIVE STATEMENT: Has returned to work light duty. Mild pain in back and shoulder is okay.   Pt has been experiencing muscles spasms in low back and sciatica of new onset right before New Years. She saw PA on 1/3 and was prescribed muscle relaxers which have helped mildly. PA also sent a script for PT for this as well.  Plan to bring up concerns at next MD appt.   PERTINENT HISTORY:  DMT2, anemia, chronic neck pain  PAIN:  Are you having pain? PAIN:  Are you having pain? Yes: NPRS scale: 2/10  Pain location: LBP and L shoulder Pain description: Tightness Aggravating factors: Laying on L shoulder. Standing bothers low back Relieving factors: Heat, muscle relaxers    PRECAUTIONS:  Shoulder  RED FLAGS: None   WEIGHT BEARING RESTRICTIONS:  Yes POSTOPERATIVE PLAN: She will follow the rotator cuff repair protocol.  She will be placed on aspirin for blood clot prevention.  She will be seen by physical therapy postop.  She will be seen at 2 weeks postop for suture removal  FALLS:  Has patient fallen in last 6  months? 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    OBJECTIVE:  Note: Objective measures were completed at Evaluation unless otherwise noted.  PATIENT SURVEYS:  FOTO 13 FOTO:45% 12/9  COGNITIVE STATUS: Within functional limits for tasks assessed   SENSATION: WFL  EDEMA:  No  POSTURE:  Eval: forward rounded shoulder as expected in sling  HAND DOMINANCE:  Right     Body Part #1 Shoulder  PALPATION: Eval: very guarded motion through shoulder  UPPER EXTREMITY ROM:  Passive ROM Right/Lt Eval 10/18 Left 12/23 Left 1/7  Shoulder flexion 20 113! 142  Shoulder extension     Shoulder abduction 0 86   Shoulder adduction     Shoulder extension     Shoulder internal rotation  75 75  Shoulder external rotation -10 48 59  Elbow flexion full    Elbow extension -20     (Blank rows = not tested)    Active  ROM Right/Lt Eval 10/18 Left 12/23 Left 1/7  Shoulder flexion 20 113! 130  Shoulder extension     Shoulder abduction 0 86   Shoulder adduction     Shoulder extension     Shoulder internal rotation  75 Internal rotation to L5   Shoulder external rotation -10 48 Can reach behind her head   Elbow flexion full    Elbow extension -20     (Blank rows = not tested)     TREATMENT:                                                                                                                                1/24  PROM L shoulder Supine active flexion  2# 3x10 Sidelying ER  2# 2x10 Sidelying abduction 2# 2x0 Seated lumbar flexion 20sec x4 TB row GTB 2x15 TB ext GTB 2x15 Bil ER RTB 2x10 Standing OH press 2# 2x10 Standing flexion full range 1# x10 Standing abduction full range 1 # x10   1/17  PROM L shoulder PPT 2x10 5" hold Supine active flexion 1# 3x10 Sidelying ER  1# 3x10 Sidelying abduction 1# 3x10 Seated lumbar  flexion 15sec x4 TB row GTB 2x15 TB ext GTB 2x15 Bil ER RTB 3x10 Standing flexion full range 2x10 Standing abduction full range 2x10 1/13 LTR 5" x10ea Piriformis stretching 30sec x2 ea PROM L shoulder Supine active flexion 1# 2x10 Sidelying ER (trialed 2#, but too heavy) 1# 2x10 Sidelying abduction 0#x01, 1# x10 Seated lumbar flexion 15sec x3 Doorway stretch 20sec x3 TB row GTB x20 TB ext RTB x20 Bil ER YTB 2x10 Ball rolls at wall 4 way 1.1# ball x2 Standing flexion to 90 1# x10 Standing abduction to 90 2# x10        1/7   Assessed patients lumar spine: see assessment   Self trigger point release with lumbar spine.  Forward flexion stretch 5 x10 sec hold  Lateral stretch 5x5 sec hold   Reviewed shoulder exercises that she can use of rcore  Row red 2x15 with abdominal breathing  Shoulder extension 2x15 red with cuing for breathing   Manual: TP release to gluteal and lower lumbar spine LAD with grade I and II oscillations   PROM of her shoulder   Assessment of her shoulder   Review of HEP and POC      1/6 PROM all planes  Supine:   Cane flexion 2x12 2lb  ABC min cung for technique 1 lb   Standing:  Wall slides flexion and scaption- cues for full available range x10ea  Seated active flexion to 90deg 1# 2x10 Seated bicep curls 2# 2x10 Seated press out 1# 2x10 Seated ER- YTB 2x10  Pulleys flexion x27min   PATIENT EDUCATION:  Education details: Teacher, music of condition, POC, HEP, exercise form/rationale Person educated: Patient Education method: Explanation, Demonstration, Tactile cues, Verbal cues, and Handouts Education comprehension: verbalized understanding, returned demonstration, verbal cues required, tactile cues required, and needs further education  HOME EXERCISE PROGRAM: [email or web address]   ASSESSMENT:  CLINICAL IMPRESSION:  Pt demonstrates excellent ROM in all planes, though some tightness in passive IR. Worked on progressing active  strengtehning and ROM in standing and seated positions today. Good tolerance for increased resistance today. Pt will continue to benefit from progressive strengthening to improve functional use of shoulder.   REHAB POTENTIAL: Good  CLINICAL DECISION MAKING: Stable/uncomplicated  EVALUATION COMPLEXITY: Low   GOALS: Goals reviewed with patient? Yes  SHORT TERM GOALS: Target date: 4 wks (08/04/23)  Passive flexion to 140 deg without increase in pain Baseline: Goal status:achieved 12/30   2.  Passive abd to 60 deg without increase in pain Baseline:  Goal status: 60 achieved 11/11  3.  Passive ER at side to 40 deg without increase in pain Baseline:  Goal status 18 11/11   4.  Able to demo proper scapular retraction for proximal shoulder girdle support Baseline:  Goal status: progressing 12/2   5.  Patient will report a 50% reduction in intensity and duration of radicular pain  Initial  6. Patient will increase lumbar flexion and extension ROM to full  LONG TERM GOALS: Target date: POC DATE  Able to demo AROM to shoulder height, against gravity, with good  scapular control Baseline:  Goal status: IN PROGRESS  Target date:09/04/23 8 weeks has not begn active motion 12/2   2.  Will begin light resistance exercises pain <=3/10 Baseline:  Goal status: Initiated today with minor pain 12/18  Target date:  8 weeks  3.  Full AROM within 10 deg of opp UE without increase in pain Baseline:  Goal status: IN PROGRESS Target date: 10/02/23 12 weeks continues to be limited in active 12/26   4.  Proper scapular control in proprioceptive and CKC strengthening Baseline:  Goal status: INITIAL Target date: 10/03/23  12 weeks  5.  Able to lift cups/plates and other small objects into overhead cabinets without limitation by shoulder Baseline:  Goal status: IN PROGRESS Target date: 10/03/23  12 weeks  6. Patient will stand without out radicular pain for 30 min without pain.  IN  PROGRESS   PLAN:  PT FREQUENCY: 1-2x/week  PT DURATION: 12 weeks  PLANNED INTERVENTIONS: 97164- PT Re-evaluation, 97110-Therapeutic exercises, 97530- Therapeutic activity, [id number]- Neuromuscular re-education, 97535- Self Care, 16109- Manual therapy, [id number]- Aquatic Therapy, 97014- Electrical stimulation (unattended), [phone]- Traction (mechanical), Patient/Family education, Taping, Dry Needling, Joint mobilization, Spinal mobilization, Scar mobilization, Cryotherapy, and Moist heat.  PLAN FOR NEXT SESSION: continue PROM per protocol; consider manual therapy to lumbar spine. Progress core strengthening exercises.   Riki Altes, PTA      10/16/23 2:23 PM

## 2023-10-23 ENCOUNTER — Encounter (HOSPITAL_BASED_OUTPATIENT_CLINIC_OR_DEPARTMENT_OTHER): Payer: Self-pay | Admitting: Physical Therapy

## 2023-10-23 ENCOUNTER — Ambulatory Visit (HOSPITAL_BASED_OUTPATIENT_CLINIC_OR_DEPARTMENT_OTHER): Payer: 59 | Admitting: Physical Therapy

## 2023-10-23 DIAGNOSIS — M25612 Stiffness of left shoulder, not elsewhere classified: Secondary | ICD-10-CM

## 2023-10-23 DIAGNOSIS — M6281 Muscle weakness (generalized): Secondary | ICD-10-CM | POA: Diagnosis not present

## 2023-10-23 DIAGNOSIS — G8929 Other chronic pain: Secondary | ICD-10-CM

## 2023-10-23 DIAGNOSIS — M25512 Pain in left shoulder: Secondary | ICD-10-CM

## 2023-10-23 DIAGNOSIS — M5441 Lumbago with sciatica, right side: Secondary | ICD-10-CM | POA: Diagnosis not present

## 2023-10-23 NOTE — Patient Instructions (Signed)

## 2023-10-23 NOTE — Therapy (Unsigned)
OUTPATIENT PHYSICAL THERAPY TREATMENT   Patient Name: Cindy Carson MRN: [id number] DOB:1970/12/31, 53 y.o., female Today's Date: 10/24/2023  END OF SESSION:  PT End of Session - 10/23/23 1433     Visit Number 24    Number of Visits 25    Date for PT Re-Evaluation 11/25/23    Authorization Time Period 08/25/22 to 10/20/22    PT Start Time 1432    PT Stop Time 1515    PT Time Calculation (min) 43 min    Activity Tolerance Patient tolerated treatment well    Behavior During Therapy [facility] for tasks assessed/performed                          Past Medical History:  Diagnosis Date   Anemia 12/09/2018   Chronic low back pain    Chronic neck pain    Diabetes type 2, uncontrolled    diagnosed in 2019   Hyperlipidemia 08/01/2021   Hypertension complicating diabetes (HCC)    Neck pain 07/11/2021   Obesity 12/09/2018   OSA (obstructive sleep apnea)    sleep study done in [place] in 2019 per pt   Rash 01/28/2022   Screening for colon cancer 08/01/2021   Past Surgical History:  Procedure Laterality Date   COLONOSCOPY     COLONOSCOPY N/A 09/03/2021   Procedure: COLONOSCOPY;  Surgeon: Midge Minium, MD;  Location: [facility] ENDOSCOPY;  Service: Endoscopy;  Laterality: N/A;   SHOULDER ARTHROSCOPY WITH ROTATOR CUFF REPAIR Left 07/07/2023   Procedure: LEFT SHOULDER ARTHROSCOPY / DEBRIDEMENT WITH ROTATOR CUFF REPAIR;  Surgeon: Huel Cote, MD;  Location: [facility];  Service: Orthopedics;  Laterality: Left;   TUBAL LIGATION     Patient Active Problem List   Diagnosis Date Noted   Nontraumatic complete tear of left rotator cuff 07/07/2023   Type 2 diabetes mellitus with hyperglycemia, without long-term current use of insulin (HCC) 07/25/2022   Depression, recurrent (HCC) 07/25/2022   Encounter for screening for malignant neoplasm of breast 07/11/2021   Hyperlipidemia associated with type 2 diabetes mellitus (HCC) 07/11/2021   Personal history of noncompliance  with medical treatment, presenting hazards to health 02/15/2019   History of syphilis 12/09/2018   Bursitis of hip 12/09/2018   Anemia 12/09/2018   Obesity 12/09/2018   OSA (obstructive sleep apnea)    HTN (hypertension)    Hypertension complicating diabetes (HCC)    Chronic low back pain    Chronic neck pain      REFERRING PROVIDER:  Huel Cote, MD    REFERRING DIAG: [phone] (ICD-10-CM) - Nontraumatic complete tear of left rotator cuff  S/p Lt RCR  Rationale for Evaluation and Treatment: Rehabilitation  THERAPY DIAG:  Muscle weakness (generalized)  Chronic left shoulder pain  Acute pain of left shoulder  Stiffness of left shoulder, not elsewhere classified  ONSET DATE: DOS 07/07/23   Days since surgery: 108  SUBJECTIVE:  SUBJECTIVE STATEMENT: The patient reports minor soreness at night with her shoulder. Her back is still sore.   Pt has been experiencing muscles spasms in low back and sciatica of new onset right before New Years. She saw PA on 1/3 and was prescribed muscle relaxers which have helped mildly. PA also sent a script for PT for this as well.  Plan to bring up concerns at next MD appt.   PERTINENT HISTORY:  DMT2, anemia, chronic neck pain  PAIN:  Are you having pain? PAIN:  Are you having pain? Yes: NPRS scale: 2/10  Pain location: LBP and L shoulder Pain description: Tightness Aggravating factors: Laying on L shoulder. Standing bothers low back Relieving factors: Heat, muscle relaxers    PRECAUTIONS:  Shoulder  RED FLAGS: None   WEIGHT BEARING RESTRICTIONS:  Yes POSTOPERATIVE PLAN: She will follow the rotator cuff repair protocol.  She will be placed on aspirin for blood clot prevention.  She will be seen by physical therapy postop.  She will be seen at 2  weeks postop for suture removal  FALLS:  Has patient fallen in last 6 months? 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    OBJECTIVE:  Note: Objective measures were completed at Evaluation unless otherwise noted.  PATIENT SURVEYS:  FOTO 13 FOTO:45% 12/9  COGNITIVE STATUS: Within functional limits for tasks assessed   SENSATION: WFL  EDEMA:  No  POSTURE:  Eval: forward rounded shoulder as expected in sling  HAND DOMINANCE:  Right     Body Part #1 Shoulder  PALPATION: Eval: very guarded motion through shoulder  UPPER EXTREMITY ROM:  Passive ROM Right/Lt Eval 10/18 Left 12/23 Left 1/7  Shoulder flexion 20 113! 142  Shoulder extension     Shoulder abduction 0 86   Shoulder adduction     Shoulder extension     Shoulder internal rotation  75 75  Shoulder external rotation -10 48 59  Elbow flexion full    Elbow extension -20     (Blank rows = not tested)    Active  ROM Right/Lt Eval 10/18 Left 12/23 Left 1/7  Shoulder flexion 20 113! 130  Shoulder extension     Shoulder abduction 0 86   Shoulder adduction     Shoulder extension     Shoulder internal rotation  75 Internal rotation to L5   Shoulder external rotation -10 48 Can reach behind her head   Elbow flexion full    Elbow extension -20     (Blank rows = not tested)     TREATMENT:                                                                                                                               Manual: Skilled palpation of trigger points. Trigger point  release to bilateral gluteals and lower lumbar spine.  Review of self soft tissue mobilization   There-ex: LTR: x20   Gluteal stretch 3x20 sec hold   Reviewed for HEP Updated and reviewed HEP    Nuro-Re-ed : all core exercises performed in conjunction with TA breathing  Bridging 2x10  Hip  abduction 2x10    Trigger Point Dry Needling  Initial Treatment: Pt instructed on Dry Needling rational, procedures, and possible side effects. Pt instructed to expect mild to moderate muscle soreness later in the day and/or into the next day.  Pt instructed in methods to reduce muscle soreness. Pt instructed to continue prescribed HEP. Because Dry Needling was performed over or adjacent to a lung field, pt was educated on S/S of pneumothorax and to seek immediate medical attention should they occur.  Patient was educated on signs and symptoms of infection and other risk factors and advised to seek medical attention should they occur.  Patient verbalized understanding of these instructions and education.   Patient Verbal Consent Given: Yes Education Handout Provided: Yes Muscles Treated: bilateral upper gluteal and lower lumbar paraspinlas L5 1x in each spot .30x50 needle  Electrical Stimulation Performed: No Treatment Response/Outcome: great twitch      1/24  PROM L shoulder Supine active flexion 2# 3x10 Sidelying ER  2# 2x10 Sidelying abduction 2# 2x0 Seated lumbar flexion 20sec x4 TB row GTB 2x15 TB ext GTB 2x15 Bil ER RTB 2x10 Standing OH press 2# 2x10 Standing flexion full range 1# x10 Standing abduction full range 1 # x10   1/17  PROM L shoulder PPT 2x10 5" hold Supine active flexion 1# 3x10 Sidelying ER  1# 3x10 Sidelying abduction 1# 3x10 Seated lumbar flexion 15sec x4 TB row GTB 2x15 TB ext GTB 2x15 Bil ER RTB 3x10 Standing flexion full range 2x10 Standing abduction full range 2x10 1/13 LTR 5" x10ea Piriformis stretching 30sec x2 ea PROM L shoulder Supine active flexion 1# 2x10 Sidelying ER (trialed 2#, but too heavy) 1# 2x10 Sidelying abduction 0#x01, 1# x10 Seated lumbar flexion 15sec x3 Doorway stretch 20sec x3 TB row GTB x20 TB ext RTB x20 Bil ER YTB 2x10 Ball rolls at wall 4 way 1.1# ball x2 Standing flexion to 90 1# x10 Standing abduction  to 90 2# x10        1/7   Assessed patients lumar spine: see assessment   Self trigger point release with lumbar spine.  Forward flexion stretch 5 x10 sec hold  Lateral stretch 5x5 sec hold   Reviewed shoulder exercises that she can use of rcore  Row red 2x15 with abdominal breathing  Shoulder extension 2x15 red with cuing for breathing   Manual: TP release to gluteal and lower lumbar spine LAD with grade I and II oscillations   PROM of her shoulder   Assessment of her shoulder   Review of HEP and POC      1/6 PROM all planes  Supine:   Cane flexion 2x12 2lb  ABC min cung for technique 1 lb   Standing:  Wall slides flexion and scaption- cues for full available range x10ea  Seated active flexion to 90deg 1# 2x10 Seated bicep curls 2# 2x10 Seated press out 1# 2x10 Seated ER- YTB 2x10  Pulleys flexion x78min   PATIENT EDUCATION:  Education details: Teacher, music of condition, POC, HEP, exercise form/rationale Person educated: Patient Education method: Explanation, Demonstration, Tactile cues, Verbal cues, and Handouts Education comprehension: verbalized understanding, returned demonstration, verbal cues required, tactile cues  required, and needs further education  HOME EXERCISE PROGRAM: [email or web address]   ASSESSMENT:  CLINICAL IMPRESSION:  The patient reports that her back pain continues to be an issue. We focused more on her back today. She had significant spasming in her bilateral. We reviewed exericses that both worked the [employer] and worked the [employer] for [employer]. She tolerated well. We educated the patient on the benefits and risks of dry needling. We perfroemd dry needling to the lower lumbar spine and gluteals. She had a great twtich with each. We perfromed manual soft tissue mobilization We reviewed exercises that will reduce post needle soreness.   REHAB POTENTIAL: Good  CLINICAL DECISION MAKING: Stable/uncomplicated  EVALUATION  COMPLEXITY: Low   GOALS: Goals reviewed with patient? Yes  SHORT TERM GOALS: Target date: 4 wks (08/04/23)  Passive flexion to 140 deg without increase in pain Baseline: Goal status:achieved 12/30   2.  Passive abd to 60 deg without increase in pain Baseline:  Goal status: 60 achieved 11/11  3.  Passive ER at side to 40 deg without increase in pain Baseline:  Goal status 18 11/11   4.  Able to demo proper scapular retraction for proximal shoulder girdle support Baseline:  Goal status: progressing 12/2   5.  Patient will report a 50% reduction in intensity and duration of radicular pain  Initial  6. Patient will increase lumbar flexion and extension ROM to full  LONG TERM GOALS: Target date: POC DATE  Able to demo AROM to shoulder height, against gravity, with good scapular control Baseline:  Goal status: IN PROGRESS  Target date:09/04/23 8 weeks has not begn active motion 12/2   2.  Will begin light resistance exercises pain <=3/10 Baseline:  Goal status: Initiated today with minor pain 12/18  Target date:  8 weeks  3.  Full AROM within 10 deg of opp UE without increase in pain Baseline:  Goal status: IN PROGRESS Target date: 10/02/23 12 weeks continues to be limited in active 12/26   4.  Proper scapular control in proprioceptive and CKC strengthening Baseline:  Goal status: INITIAL Target date: 10/03/23  12 weeks  5.  Able to lift cups/plates and other small objects into overhead cabinets without limitation by shoulder Baseline:  Goal status: IN PROGRESS Target date: 10/03/23  12 weeks  6. Patient will stand without out radicular pain for 30 min without pain.  IN PROGRESS   PLAN:  PT FREQUENCY: 1-2x/week  PT DURATION: 12 weeks  PLANNED INTERVENTIONS: 97164- PT Re-evaluation, 97110-Therapeutic exercises, 97530- Therapeutic activity, [id number]- Neuromuscular re-education, 97535- Self Care, 54098- Manual therapy, [id number]- Aquatic Therapy, 97014- Electrical  stimulation (unattended), [id number]- Traction (mechanical), Patient/Family education, Taping, Dry Needling, Joint mobilization, Spinal mobilization, Scar mobilization, Cryotherapy, and Moist heat.  PLAN FOR NEXT SESSION: continue PROM per protocol; consider manual therapy to lumbar spine. Progress core strengthening exercises.    Lorayne Bender PT DPT     10/24/23 8:17 AM

## 2023-10-26 ENCOUNTER — Telehealth: Payer: Self-pay | Admitting: Orthopaedic Surgery

## 2023-10-26 NOTE — Telephone Encounter (Signed)
[place] forms received. To datavant.

## 2023-10-30 ENCOUNTER — Encounter (HOSPITAL_BASED_OUTPATIENT_CLINIC_OR_DEPARTMENT_OTHER): Payer: Self-pay | Admitting: Physical Therapy

## 2023-10-30 ENCOUNTER — Ambulatory Visit (HOSPITAL_BASED_OUTPATIENT_CLINIC_OR_DEPARTMENT_OTHER): Payer: 59 | Attending: Orthopaedic Surgery | Admitting: Physical Therapy

## 2023-10-30 DIAGNOSIS — M25512 Pain in left shoulder: Secondary | ICD-10-CM | POA: Diagnosis not present

## 2023-10-30 DIAGNOSIS — G8929 Other chronic pain: Secondary | ICD-10-CM | POA: Insufficient documentation

## 2023-10-30 DIAGNOSIS — M25612 Stiffness of left shoulder, not elsewhere classified: Secondary | ICD-10-CM | POA: Insufficient documentation

## 2023-10-30 DIAGNOSIS — M6281 Muscle weakness (generalized): Secondary | ICD-10-CM | POA: Diagnosis not present

## 2023-10-30 NOTE — Therapy (Signed)
 OUTPATIENT PHYSICAL THERAPY TREATMENT   Patient Name: Angely Dietz MRN: [id number] DOB:Feb 07, 1971, 53 y.o., female Today's Date: 10/30/2023  END OF SESSION:  PT End of Session - 10/30/23 0850     Visit Number 25    Number of Visits 37    Date for PT Re-Evaluation 12/11/23    Authorization Type UMR    Authorization Time Period 08/25/22 to 10/20/22    PT Start Time 0845    PT Stop Time 0927    PT Time Calculation (min) 42 min    Activity Tolerance Patient tolerated treatment well    Behavior During Therapy [facility] for tasks assessed/performed                          Past Medical History:  Diagnosis Date   Anemia 12/09/2018   Chronic low back pain    Chronic neck pain    Diabetes type 2, uncontrolled    diagnosed in 2019   Hyperlipidemia 08/01/2021   Hypertension complicating diabetes (HCC)    Neck pain 07/11/2021   Obesity 12/09/2018   OSA (obstructive sleep apnea)    sleep study done in [place] in 2019 per pt   Rash 01/28/2022   Screening for colon cancer 08/01/2021   Past Surgical History:  Procedure Laterality Date   COLONOSCOPY     COLONOSCOPY N/A 09/03/2021   Procedure: COLONOSCOPY;  Surgeon: Jinny Carmine, MD;  Location: [facility] ENDOSCOPY;  Service: Endoscopy;  Laterality: N/A;   SHOULDER ARTHROSCOPY WITH ROTATOR CUFF REPAIR Left 07/07/2023   Procedure: LEFT SHOULDER ARTHROSCOPY / DEBRIDEMENT WITH ROTATOR CUFF REPAIR;  Surgeon: Genelle Standing, MD;  Location: [facility];  Service: Orthopedics;  Laterality: Left;   TUBAL LIGATION     Patient Active Problem List   Diagnosis Date Noted   Nontraumatic complete tear of left rotator cuff 07/07/2023   Type 2 diabetes mellitus with hyperglycemia, without long-term current use of insulin  (HCC) 07/25/2022   Depression, recurrent (HCC) 07/25/2022   Encounter for screening for malignant neoplasm of breast 07/11/2021   Hyperlipidemia associated with type 2 diabetes mellitus (HCC) 07/11/2021    Personal history of noncompliance with medical treatment, presenting hazards to health 02/15/2019   History of syphilis 12/09/2018   Bursitis of hip 12/09/2018   Anemia 12/09/2018   Obesity 12/09/2018   OSA (obstructive sleep apnea)    HTN (hypertension)    Hypertension complicating diabetes (HCC)    Chronic low back pain    Chronic neck pain      REFERRING PROVIDER:  Genelle Standing, MD    REFERRING DIAG: [phone] (ICD-10-CM) - Nontraumatic complete tear of left rotator cuff  S/p Lt RCR  Rationale for Evaluation and Treatment: Rehabilitation  THERAPY DIAG:  Muscle weakness (generalized) - Plan: PT plan of care cert/re-cert  Chronic left shoulder pain - Plan: PT plan of care cert/re-cert  Acute pain of left shoulder - Plan: PT plan of care cert/re-cert  Stiffness of left shoulder, not elsewhere classified - Plan: PT plan of care cert/re-cert  ONSET DATE: DOS 07/07/23   Days since surgery: 115  SUBJECTIVE:  SUBJECTIVE STATEMENT: The patient has had intermittent pain in her shoulder at work. Sh ethinks the back is improving as well.    Pt has been experiencing muscles spasms in low back and sciatica of new onset right before New Years. She saw PA on 1/3 and was prescribed muscle relaxers which have helped mildly. PA also sent a script for PT for this as well.  Plan to bring up concerns at next MD appt.   PERTINENT HISTORY:  DMT2, anemia, chronic neck pain  PAIN:  Are you having pain? PAIN:  Are you having pain? Yes: NPRS scale: 2/10  Pain location: LBP and L shoulder Pain description: Tightness Aggravating factors: Laying on L shoulder. Standing bothers low back Relieving factors: Heat, muscle relaxers    PRECAUTIONS:  Shoulder  RED FLAGS: None   WEIGHT BEARING RESTRICTIONS:   Yes POSTOPERATIVE PLAN: She will follow the rotator cuff repair protocol.  She will be placed on aspirin  for blood clot prevention.  She will be seen by physical therapy postop.  She will be seen at 2 weeks postop for suture removal  FALLS:  Has patient fallen in last 6 months? 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    OBJECTIVE:  Note: Objective measures were completed at Evaluation unless otherwise noted.  PATIENT SURVEYS:  FOTO 13 FOTO:45% 12/9  COGNITIVE STATUS: Within functional limits for tasks assessed   SENSATION: WFL  EDEMA:  No  POSTURE:  Eval: forward rounded shoulder as expected in sling  HAND DOMINANCE:  Right     Body Part #1 Shoulder  PALPATION: Eval: very guarded motion through shoulder  UPPER EXTREMITY ROM:  Passive ROM Right/Lt Eval 10/18 Left 12/23 Left 1/7  Shoulder flexion 20 113! 142  Shoulder extension     Shoulder abduction 0 86   Shoulder adduction     Shoulder extension     Shoulder internal rotation  75 75  Shoulder external rotation -10 48 59  Elbow flexion full    Elbow extension -20     (Blank rows = not tested)    Active  ROM Right/Lt Eval 10/18 Left 12/23 Left 1/7 Left  2/7  Shoulder flexion 20 113! 130 156   Shoulder extension      Shoulder abduction 0 86    Shoulder adduction      Shoulder extension      Shoulder internal rotation  75 Internal rotation to L5  IR to L5   Shoulder external rotation -10 48 Can reach behind her head  Full   Elbow flexion full     Elbow extension -20      (Blank rows = not tested)  MMT 4+/5 flexion and ER on left side   Lubar ROM:   Mild pain at end range extension and flexion     TREATMENT:  2/7 There-ex: LTR:  x20   Gluteal stretch 3x20 sec hold   Reviewed for HEP Updated and reviewed HEP    Nuro-Re-ed : all core exercises performed in conjunction with TA breathing  Bridging 2x10  Hip abduction 2x10  Supine ABC 1x   There act:   shoulder flexion 2x10 Shoulder scaption 2x10   Mild pain  with flexion        Last visit:  Manual: Skilled palpation of trigger points. Trigger point release to bilateral gluteals and lower lumbar spine.  Review of self soft tissue mobilization   There-ex: LTR: x20   Gluteal stretch 3x20 sec hold   Reviewed for HEP Updated and reviewed HEP    Nuro-Re-ed : all core exercises performed in conjunction with TA breathing  Bridging 2x10  Hip abduction 2x10    Trigger Point Dry Needling  Initial Treatment: Pt instructed on Dry Needling rational, procedures, and possible side effects. Pt instructed to expect mild to moderate muscle soreness later in the day and/or into the next day.  Pt instructed in methods to reduce muscle soreness. Pt instructed to continue prescribed HEP. Because Dry Needling was performed over or adjacent to a lung field, pt was educated on S/S of pneumothorax and to seek immediate medical attention should they occur.  Patient was educated on signs and symptoms of infection and other risk factors and advised to seek medical attention should they occur.  Patient verbalized understanding of these instructions and education.   Patient Verbal Consent Given: Yes Education Handout Provided: Yes Muscles Treated: bilateral upper gluteal and lower lumbar paraspinlas L5 1x in each spot .30x50 needle  Electrical Stimulation Performed: No Treatment Response/Outcome: great twitch      1/24  PROM L shoulder Supine active flexion 2# 3x10 Sidelying ER  2# 2x10 Sidelying abduction 2# 2x0 Seated lumbar flexion 20sec x4 TB row GTB 2x15 TB ext GTB 2x15 Bil ER RTB 2x10 Standing OH press 2# 2x10 Standing flexion full range 1#  x10 Standing abduction full range 1 # x10   1/17  PROM L shoulder PPT 2x10 5 hold Supine active flexion 1# 3x10 Sidelying ER  1# 3x10 Sidelying abduction 1# 3x10 Seated lumbar flexion 15sec x4 TB row GTB 2x15 TB ext GTB 2x15 Bil ER RTB 3x10 Standing flexion full range 2x10 Standing abduction full range 2x10 1/13 LTR 5 x10ea Piriformis stretching 30sec x2 ea PROM L shoulder Supine active flexion 1# 2x10 Sidelying ER (trialed 2#, but too heavy) 1# 2x10 Sidelying abduction 0#x01, 1# x10 Seated lumbar flexion 15sec x3 Doorway stretch 20sec x3 TB row GTB x20 TB ext RTB x20 Bil ER YTB 2x10 Ball rolls at wall 4 way 1.1# ball x2 Standing flexion to 90 1# x10 Standing abduction to 90 2# x10      PATIENT EDUCATION:  Education details: Teacher, Music of condition, POC, HEP, exercise form/rationale Person educated: Patient Education method: Explanation, Demonstration, Tactile cues, Verbal cues, and Handouts Education comprehension: verbalized understanding, returned demonstration, verbal cues required, tactile cues required, and needs further education  HOME EXERCISE PROGRAM: [email or web address]   ASSESSMENT:  CLINICAL IMPRESSION: The patient is making good progress. We were able to advance her weight with supine ABC.. We also reviewed more core strengthening exercise. She was advised to continue to work on forward flexion and scaption. Overall she is making great progress. She has full ROM. She continues to have mild weakness. Her lumbar ROM has improved     REHAB POTENTIAL: Good  CLINICAL DECISION MAKING: Stable/uncomplicated  EVALUATION COMPLEXITY: Low   GOALS: Goals reviewed with patient? Yes  SHORT TERM GOALS: Target date: 4 wks (08/04/23)  Passive flexion to 140 deg without increase in pain Baseline: Goal status:achieved 12/30   2.  Passive abd to 60 deg without increase in pain Baseline:  Goal status: 60 achieved 11/11  3.  Passive ER at side to 40 deg without  increase in pain Baseline:  Goal status 18 11/11   4.  Able to demo proper scapular retraction for proximal shoulder girdle support Baseline:  Goal status: progressing 12/2   5.  Patient will report a 50% reduction in intensity and duration of radicular pain  Initial  6. Patient will increase lumbar flexion and extension ROM to full  LONG TERM GOALS: Target date: POC DATE  Able to demo AROM to shoulder height, against gravity, with good scapular control Baseline:  Goal status: IN PROGRESS  Target date:09/04/23 8 weeks has not begn active motion 12/2   2.  Will begin light resistance exercises pain <=3/10 Baseline:  Goal status: Initiated today with minor pain 12/18  Target date:  8 weeks  3.  Full AROM within 10 deg of opp UE without increase in pain Baseline:  Goal status: IN PROGRESS Target date: 10/02/23 12 weeks continues to be limited in active 12/26   4.  Proper scapular control in proprioceptive and CKC strengthening Baseline:  Goal status: INITIAL Target date: 10/03/23  12 weeks  5.  Able to lift cups/plates and other small objects into overhead cabinets without limitation by shoulder Baseline:  Goal status: IN PROGRESS Target date: 10/03/23  12 weeks  6. Patient will stand without out radicular pain for 30 min without pain.  IN PROGRESS   PLAN:  PT FREQUENCY: 1-2x/week  PT DURATION: 12 weeks  PLANNED INTERVENTIONS: 97164- PT Re-evaluation, 97110-Therapeutic exercises, 97530- Therapeutic activity, [id number]- Neuromuscular re-education, 97535- Self Care, 02859- Manual therapy, [id number]- Aquatic Therapy, 97014- Electrical stimulation (unattended), [phone]- Traction (mechanical), Patient/Family education, Taping, Dry Needling, Joint mobilization, Spinal mobilization, Scar mobilization, Cryotherapy, and Moist heat.  PLAN FOR NEXT SESSION: continue PROM per protocol; consider manual therapy to lumbar spine. Progress core strengthening exercises.    Alm Don PT  DPT     10/30/23 1:08 PM

## 2023-11-06 ENCOUNTER — Encounter (HOSPITAL_BASED_OUTPATIENT_CLINIC_OR_DEPARTMENT_OTHER): Payer: Self-pay

## 2023-11-06 ENCOUNTER — Ambulatory Visit (HOSPITAL_BASED_OUTPATIENT_CLINIC_OR_DEPARTMENT_OTHER): Payer: 59

## 2023-11-06 DIAGNOSIS — M25512 Pain in left shoulder: Secondary | ICD-10-CM

## 2023-11-06 DIAGNOSIS — M25612 Stiffness of left shoulder, not elsewhere classified: Secondary | ICD-10-CM

## 2023-11-06 DIAGNOSIS — M6281 Muscle weakness (generalized): Secondary | ICD-10-CM | POA: Diagnosis not present

## 2023-11-06 DIAGNOSIS — G8929 Other chronic pain: Secondary | ICD-10-CM | POA: Diagnosis not present

## 2023-11-06 NOTE — Therapy (Signed)
OUTPATIENT PHYSICAL THERAPY TREATMENT   Patient Name: Cindy Carson MRN: [id number] DOB:1971-06-16, 53 y.o., female Today's Date: 11/06/2023  END OF SESSION:  PT End of Session - 11/06/23 0903     Visit Number 26    Number of Visits 37    Date for PT Re-Evaluation 12/11/23    Authorization Type UMR    Authorization Time Period 08/25/22 to 10/20/22    PT Start Time 0849    PT Stop Time 0930    PT Time Calculation (min) 41 min    Activity Tolerance Patient tolerated treatment well    Behavior During Therapy [facility] for tasks assessed/performed                           Past Medical History:  Diagnosis Date   Anemia 12/09/2018   Chronic low back pain    Chronic neck pain    Diabetes type 2, uncontrolled    diagnosed in 2019   Hyperlipidemia 08/01/2021   Hypertension complicating diabetes (HCC)    Neck pain 07/11/2021   Obesity 12/09/2018   OSA (obstructive sleep apnea)    sleep study done in [place] in 2019 per pt   Rash 01/28/2022   Screening for colon cancer 08/01/2021   Past Surgical History:  Procedure Laterality Date   COLONOSCOPY     COLONOSCOPY N/A 09/03/2021   Procedure: COLONOSCOPY;  Surgeon: Midge Minium, MD;  Location: [facility] ENDOSCOPY;  Service: Endoscopy;  Laterality: N/A;   SHOULDER ARTHROSCOPY WITH ROTATOR CUFF REPAIR Left 07/07/2023   Procedure: LEFT SHOULDER ARTHROSCOPY / DEBRIDEMENT WITH ROTATOR CUFF REPAIR;  Surgeon: Huel Cote, MD;  Location: [facility];  Service: Orthopedics;  Laterality: Left;   TUBAL LIGATION     Patient Active Problem List   Diagnosis Date Noted   Nontraumatic complete tear of left rotator cuff 07/07/2023   Type 2 diabetes mellitus with hyperglycemia, without long-term current use of insulin (HCC) 07/25/2022   Depression, recurrent (HCC) 07/25/2022   Encounter for screening for malignant neoplasm of breast 07/11/2021   Hyperlipidemia associated with type 2 diabetes mellitus (HCC) 07/11/2021    Personal history of noncompliance with medical treatment, presenting hazards to health 02/15/2019   History of syphilis 12/09/2018   Bursitis of hip 12/09/2018   Anemia 12/09/2018   Obesity 12/09/2018   OSA (obstructive sleep apnea)    HTN (hypertension)    Hypertension complicating diabetes (HCC)    Chronic low back pain    Chronic neck pain      REFERRING PROVIDER:  Huel Cote, MD    REFERRING DIAG: [phone] (ICD-10-CM) - Nontraumatic complete tear of left rotator cuff  S/p Lt RCR  Rationale for Evaluation and Treatment: Rehabilitation  THERAPY DIAG:  Muscle weakness (generalized)  Acute pain of left shoulder  Stiffness of left shoulder, not elsewhere classified  Chronic left shoulder pain  ONSET DATE: DOS 07/07/23   Days since surgery: 122  SUBJECTIVE:  SUBJECTIVE STATEMENT: Pt reports shoulder is doing well overall. Back pain stays constant, more with standing vs sitting.   Pt has been experiencing muscles spasms in low back and sciatica of new onset right before New Years. She saw PA on 1/3 and was prescribed muscle relaxers which have helped mildly. PA also sent a script for PT for this as well.  Plan to bring up concerns at next MD appt.   PERTINENT HISTORY:  DMT2, anemia, chronic neck pain  PAIN:  Are you having pain? PAIN:  Are you having pain? Yes: NPRS scale: 2/10  Pain location: LBP and L shoulder Pain description: Tightness Aggravating factors: Laying on L shoulder. Standing bothers low back Relieving factors: Heat, muscle relaxers    PRECAUTIONS:  Shoulder  RED FLAGS: None   WEIGHT BEARING RESTRICTIONS:  Yes POSTOPERATIVE PLAN: She will follow the rotator cuff repair protocol.  She will be placed on aspirin for blood clot prevention.  She will be seen by  physical therapy postop.  She will be seen at 2 weeks postop for suture removal  FALLS:  Has patient fallen in last 6 months? 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    OBJECTIVE:  Note: Objective measures were completed at Evaluation unless otherwise noted.  PATIENT SURVEYS:  FOTO 13 FOTO:45% 12/9  COGNITIVE STATUS: Within functional limits for tasks assessed   SENSATION: WFL  EDEMA:  No  POSTURE:  Eval: forward rounded shoulder as expected in sling  HAND DOMINANCE:  Right     Body Part #1 Shoulder  PALPATION: Eval: very guarded motion through shoulder  UPPER EXTREMITY ROM:  Passive ROM Right/Lt Eval 10/18 Left 12/23 Left 1/7  Shoulder flexion 20 113! 142  Shoulder extension     Shoulder abduction 0 86   Shoulder adduction     Shoulder extension     Shoulder internal rotation  75 75  Shoulder external rotation -10 48 59  Elbow flexion full    Elbow extension -20     (Blank rows = not tested)    Active  ROM Right/Lt Eval 10/18 Left 12/23 Left 1/7 Left  2/7  Shoulder flexion 20 113! 130 156   Shoulder extension      Shoulder abduction 0 86    Shoulder adduction      Shoulder extension      Shoulder internal rotation  75 Internal rotation to L5  IR to L5   Shoulder external rotation -10 48 Can reach behind her head  Full   Elbow flexion full     Elbow extension -20      (Blank rows = not tested)  MMT 4+/5 flexion and ER on left side   Lubar ROM:   Mild pain at end range extension and flexion     TREATMENT:  Treatment                            11/06/23: Blank lines following charge title = not provided on this treatment date.   Manual:  TPDN No  There-ex: PROM L  shoulder Piriformis stretch LTR 5" x20 Lumbar stretch holding onto back of bike 3x15 seconds Standing shoulder flexion 2# x10 Standing abduction 2# x10 Standing OH press 2# 2x10 Standing press out 2# 2x10 Wall push ups 2x10  There-Act:  Self Care:  Nuro-Re-ed: PPT 5" x15 PPT with marching in hooklying 2x10 Gait Training:    2/7 There-ex: LTR: x20   Gluteal stretch 3x20 sec hold   Reviewed for HEP Updated and reviewed HEP    Nuro-Re-ed : all core exercises performed in conjunction with TA breathing  Bridging 2x10  Hip abduction 2x10  Supine ABC 1x   There act:   shoulder flexion 2x10 Shoulder scaption 2x10   Mild pain  with flexion        Last visit:  Manual: Skilled palpation of trigger points. Trigger point release to bilateral gluteals and lower lumbar spine.  Review of self soft tissue mobilization   There-ex: LTR: x20   Gluteal stretch 3x20 sec hold   Reviewed for HEP Updated and reviewed HEP    Nuro-Re-ed : all core exercises performed in conjunction with TA breathing  Bridging 2x10  Hip abduction 2x10    Trigger Point Dry Needling  Initial Treatment: Pt instructed on Dry Needling rational, procedures, and possible side effects. Pt instructed to expect mild to moderate muscle soreness later in the day and/or into the next day.  Pt instructed in methods to reduce muscle soreness. Pt instructed to continue prescribed HEP. Because Dry Needling was performed over or adjacent to a lung field, pt was educated on S/S of pneumothorax and to seek immediate medical attention should they occur.  Patient was educated on signs and symptoms of infection and other risk factors and advised to seek medical attention should they occur.  Patient verbalized understanding of these instructions and education.   Patient Verbal Consent Given: Yes Education Handout Provided: Yes Muscles Treated: bilateral upper gluteal and lower lumbar paraspinlas L5 1x in  each spot .30x50 needle  Electrical Stimulation Performed: No Treatment Response/Outcome: great twitch      PATIENT EDUCATION:  Education details: Anatomy of condition, POC, HEP, exercise form/rationale Person educated: Patient Education method: Explanation, Demonstration, Tactile cues, Verbal cues, and Handouts Education comprehension: verbalized understanding, returned demonstration, verbal cues required, tactile cues required, and needs further education  HOME EXERCISE PROGRAM: [id number]   ASSESSMENT:  CLINICAL IMPRESSION: Worked on L shoulder strengthening progressions today with good tolerance. She remains challenged with standing active strengthening, though imrpoving overall. Good tolerance for lumbar streching and strengthening. Good form noted with PPT and added in march today for further core engagement. Discussed achieving a more neutral pelvic position in standing vs anterior tilt to decrease activation in lumbar extensors. Advised pt stretch lumbar mm throughout day when having to stand for prolonged periods. Will continue to progress in PT as tolerated.     REHAB POTENTIAL: Good  CLINICAL DECISION MAKING: Stable/uncomplicated  EVALUATION COMPLEXITY: Low   GOALS: Goals reviewed with patient? Yes  SHORT TERM GOALS: Target date: 4 wks (08/04/23)  Passive flexion to 140 deg without increase in pain Baseline: Goal status:achieved 12/30   2.  Passive abd to 60 deg without increase in pain Baseline:  Goal status: 60 achieved 11/11  3.  Passive ER at side to 40 deg without increase in pain Baseline:  Goal status 18 11/11   4.  Able to demo proper scapular retraction for proximal shoulder girdle support Baseline:  Goal status: progressing 12/2   5.  Patient will report a 50% reduction in intensity and duration of radicular pain  Initial  6. Patient will increase lumbar flexion and extension ROM to full  LONG TERM GOALS: Target date: POC DATE  Able to demo  AROM to shoulder height, against gravity, with good scapular control Baseline:  Goal status: IN PROGRESS  Target date:09/04/23 8 weeks has not begn active motion 12/2   2.  Will begin light resistance exercises pain <=3/10 Baseline:  Goal status: Initiated today with minor pain 12/18  Target date:  8 weeks  3.  Full AROM within 10 deg of opp UE without increase in pain Baseline:  Goal status: IN PROGRESS Target date: 10/02/23 12 weeks continues to be limited in active 12/26   4.  Proper scapular control in proprioceptive and CKC strengthening Baseline:  Goal status: INITIAL Target date: 10/03/23  12 weeks  5.  Able to lift cups/plates and other small objects into overhead cabinets without limitation by shoulder Baseline:  Goal status: IN PROGRESS Target date: 10/03/23  12 weeks  6. Patient will stand without out radicular pain for 30 min without pain.  IN PROGRESS   PLAN:  PT FREQUENCY: 1-2x/week  PT DURATION: 12 weeks  PLANNED INTERVENTIONS: 97164- PT Re-evaluation, 97110-Therapeutic exercises, 97530- Therapeutic activity, [id number]- Neuromuscular re-education, 97535- Self Care, 16109- Manual therapy, [id number]- Aquatic Therapy, 97014- Electrical stimulation (unattended), [phone]- Traction (mechanical), Patient/Family education, Taping, Dry Needling, Joint mobilization, Spinal mobilization, Scar mobilization, Cryotherapy, and Moist heat.  PLAN FOR NEXT SESSION: continue PROM per protocol; consider manual therapy to lumbar spine. Progress core strengthening exercises.    Donnel Saxon Bryanah Sidell, PTA 11/06/2023, 10:38 AM

## 2023-11-11 ENCOUNTER — Other Ambulatory Visit (HOSPITAL_BASED_OUTPATIENT_CLINIC_OR_DEPARTMENT_OTHER): Payer: Self-pay

## 2023-11-11 ENCOUNTER — Encounter: Payer: Self-pay | Admitting: Nurse Practitioner

## 2023-11-11 ENCOUNTER — Telehealth: Payer: 59 | Admitting: Nurse Practitioner

## 2023-11-11 DIAGNOSIS — F339 Major depressive disorder, recurrent, unspecified: Secondary | ICD-10-CM

## 2023-11-11 DIAGNOSIS — I10 Essential (primary) hypertension: Secondary | ICD-10-CM

## 2023-11-11 DIAGNOSIS — Z794 Long term (current) use of insulin: Secondary | ICD-10-CM | POA: Diagnosis not present

## 2023-11-11 DIAGNOSIS — E1165 Type 2 diabetes mellitus with hyperglycemia: Secondary | ICD-10-CM

## 2023-11-11 DIAGNOSIS — Z7985 Long-term (current) use of injectable non-insulin antidiabetic drugs: Secondary | ICD-10-CM | POA: Diagnosis not present

## 2023-11-11 DIAGNOSIS — E119 Type 2 diabetes mellitus without complications: Secondary | ICD-10-CM | POA: Insufficient documentation

## 2023-11-11 DIAGNOSIS — E785 Hyperlipidemia, unspecified: Secondary | ICD-10-CM

## 2023-11-11 MED ORDER — ATORVASTATIN CALCIUM 20 MG PO TABS
20.0000 mg | ORAL_TABLET | Freq: Every day | ORAL | 1 refills | Status: DC
  Filled 2023-11-11: qty 90, 90d supply, fill #0
  Filled 2024-02-23: qty 90, 90d supply, fill #1

## 2023-11-11 MED ORDER — AMLODIPINE BESYLATE 5 MG PO TABS
5.0000 mg | ORAL_TABLET | Freq: Every day | ORAL | 1 refills | Status: DC
  Filled 2023-11-11: qty 90, 90d supply, fill #0
  Filled 2024-02-23: qty 90, 90d supply, fill #1

## 2023-11-11 MED ORDER — DULOXETINE HCL 30 MG PO CPEP
30.0000 mg | ORAL_CAPSULE | Freq: Every day | ORAL | 1 refills | Status: DC
  Filled 2023-11-11: qty 90, 90d supply, fill #0
  Filled 2024-02-23: qty 90, 90d supply, fill #1

## 2023-11-11 MED ORDER — INSULIN GLARGINE-YFGN 100 UNIT/ML ~~LOC~~ SOPN: 30.0000 [IU] | PEN_INJECTOR | Freq: Every day | SUBCUTANEOUS | Status: AC

## 2023-11-11 MED ORDER — SEMAGLUTIDE (2 MG/DOSE) 8 MG/3ML ~~LOC~~ SOPN
2.0000 mg | PEN_INJECTOR | SUBCUTANEOUS | 1 refills | Status: DC
Start: 2023-11-11 — End: 2024-02-09
  Filled 2023-11-11: qty 3, 28d supply, fill #0

## 2023-11-11 NOTE — Assessment & Plan Note (Signed)
Chronic.  Controlled.  Continue with current medication regimen of Duloxetine.  Refilled at visit today.  Labs ordered today.  Return to clinic in 6 months for reevaluation.  Call sooner if concerns arise.

## 2023-11-11 NOTE — Assessment & Plan Note (Signed)
Chronic.  Not well controlled.  Labs ordered at visit today.  Sugars are running 200-400s.  Ozempic increased to 2mg .  Semglee increased to 30u nightly.  Follow up in 3 months.  Will make further recommendations based on lab results.

## 2023-11-11 NOTE — Progress Notes (Signed)
LMP 03/09/2019    Subjective:    Patient ID: Cindy Carson, female    DOB: Jul 04, 1971, 53 y.o.   MRN: [id number]  HPI: Cindy Carson is a 54 y.o. female  Chief Complaint  Patient presents with   Follow-up    HTN, HLD, DM2 and refill meds   DIABETES Ozempic 1mg - tolerating it well.  She had stopped taking the insulin for a little while.  Has been inconsistent with her medications.  Noticed that she was having increased urination and realized her sugars were in the 400s. Hypoglycemic episodes:no Polydipsia/polyuria: yes Visual disturbance: no Chest pain: no Paresthesias: no Glucose Monitoring: yes  Accucheck frequency: Daily  Fasting glucose: 200  Post prandial:  Evening:  Before meals: Taking Insulin?: yes  Long acting insulin: Semglee- 30u  Short acting insulin:   Relevant past medical, surgical, family and social history reviewed and updated as indicated. Interim medical history since our last visit reviewed. Allergies and medications reviewed and updated.  Review of Systems  Eyes:  Negative for visual disturbance.  Cardiovascular:  Negative for chest pain.  Endocrine: Negative for polydipsia and polyuria.  Neurological:  Negative for numbness.    Per HPI unless specifically indicated above     Objective:    LMP 03/09/2019   Wt Readings from Last 3 Encounters:  09/10/23 180 lb (81.6 kg)  08/12/23 189 lb 3.2 oz (85.8 kg)  07/07/23 183 lb 3.2 oz (83.1 kg)    Physical Exam Vitals and nursing note reviewed.  HENT:     Head: Normocephalic.     Right Ear: Hearing normal.     Left Ear: Hearing normal.     Nose: Nose normal.  Eyes:     Pupils: Pupils are equal, round, and reactive to light.  Pulmonary:     Effort: Pulmonary effort is normal. No respiratory distress.  Neurological:     Mental Status: She is alert.  Psychiatric:        Mood and Affect: Mood normal.        Behavior: Behavior normal.        Thought Content: Thought content normal.        Judgment:  Judgment normal.     Results for orders placed or performed in visit on 08/12/23  Bayer DCA Hb A1c Waived (STAT)   Collection Time: 08/12/23  9:55 AM  Result Value Ref Range   HB A1C (BAYER DCA - WAIVED) 11.3 (H) 4.8 - 5.6 %      Assessment & Plan:   Problem List Items Addressed This Visit       Cardiovascular and Mediastinum   Hypertension complicating diabetes (HCC)   Medications refilled at visit today.  Will have next visit in person to assess blood pressure control.      Relevant Medications   amLODipine (NORVASC) 5 MG tablet   atorvastatin (LIPITOR) 20 MG tablet   Semaglutide, 2 MG/DOSE, 8 MG/3ML SOPN   insulin glargine-yfgn (SEMGLEE, YFGN,) 100 UNIT/ML Pen   Other Relevant Orders   Comp Met (CMET)     Endocrine   Uncontrolled type 2 diabetes mellitus with hyperglycemia (HCC)   Chronic.  Not well controlled.  Labs ordered at visit today.  Sugars are running 200-400s.  Ozempic increased to 2mg .  Semglee increased to 30u nightly.  Follow up in 3 months.  Will make further recommendations based on lab results.       Relevant Medications   atorvastatin (LIPITOR) 20 MG tablet   Semaglutide,  2 MG/DOSE, 8 MG/3ML SOPN   insulin glargine-yfgn (SEMGLEE, YFGN,) 100 UNIT/ML Pen   Diabetes mellitus treated with insulin and oral medication (HCC) - Primary   Relevant Medications   atorvastatin (LIPITOR) 20 MG tablet   Semaglutide, 2 MG/DOSE, 8 MG/3ML SOPN   insulin glargine-yfgn (SEMGLEE, YFGN,) 100 UNIT/ML Pen   Other Relevant Orders   HgB A1c     Other   Depression, recurrent (HCC)   Chronic.  Controlled.  Continue with current medication regimen of Duloxetine.  Refilled at visit today.  Labs ordered today.  Return to clinic in 6 months for reevaluation.  Call sooner if concerns arise.        Relevant Medications   DULoxetine (CYMBALTA) 30 MG capsule   Other Visit Diagnoses       Hyperlipidemia, unspecified hyperlipidemia type       Relevant Medications    amLODipine (NORVASC) 5 MG tablet   atorvastatin (LIPITOR) 20 MG tablet        Follow up plan: Return in about 3 months (around 02/08/2024) for HTN, HLD, DM2 FU.   This visit was completed via MyChart due to the restrictions of the COVID-19 pandemic. All issues as above were discussed and addressed. Physical exam was done as above through visual confirmation on MyChart. If it was felt that the patient should be evaluated in the office, they were directed there. The patient verbally consented to this visit. Location of the patient: Home Location of the provider: Office Those involved with this call:  Provider: Larae Grooms, NP CMA: Irene Pap, CMA Front Desk/Registration: Servando Snare This encounter was conducted via video.  I spent 30 dedicated to the care of this patient on the date of this encounter to include previsit review of symptoms, plan of care and follow up, face to face time with the patient, and post visit ordering of testing.

## 2023-11-11 NOTE — Assessment & Plan Note (Signed)
Medications refilled at visit today.  Will have next visit in person to assess blood pressure control.

## 2023-11-13 ENCOUNTER — Encounter (HOSPITAL_BASED_OUTPATIENT_CLINIC_OR_DEPARTMENT_OTHER): Payer: Self-pay

## 2023-11-13 ENCOUNTER — Ambulatory Visit (HOSPITAL_BASED_OUTPATIENT_CLINIC_OR_DEPARTMENT_OTHER): Payer: 59

## 2023-11-13 DIAGNOSIS — M6281 Muscle weakness (generalized): Secondary | ICD-10-CM

## 2023-11-13 DIAGNOSIS — M25512 Pain in left shoulder: Secondary | ICD-10-CM | POA: Diagnosis not present

## 2023-11-13 DIAGNOSIS — G8929 Other chronic pain: Secondary | ICD-10-CM

## 2023-11-13 DIAGNOSIS — M25612 Stiffness of left shoulder, not elsewhere classified: Secondary | ICD-10-CM | POA: Diagnosis not present

## 2023-11-13 NOTE — Therapy (Signed)
OUTPATIENT PHYSICAL THERAPY TREATMENT   Patient Name: Cindy Carson MRN: [id number] DOB:Mar 15, 1971, 53 y.o., female Today's Date: 11/13/2023  END OF SESSION:  PT End of Session - 11/13/23 1019     Visit Number 27    Number of Visits 37    Date for PT Re-Evaluation 12/11/23    Authorization Type UMR    Authorization Time Period 08/25/22 to 10/20/22    PT Start Time 1016    PT Stop Time 1059    PT Time Calculation (min) 43 min    Activity Tolerance Patient tolerated treatment well    Behavior During Therapy [facility] for tasks assessed/performed                            Past Medical History:  Diagnosis Date   Anemia 12/09/2018   Chronic low back pain    Chronic neck pain    Diabetes type 2, uncontrolled    diagnosed in 2019   Hyperlipidemia 08/01/2021   Hypertension complicating diabetes (HCC)    Neck pain 07/11/2021   Obesity 12/09/2018   OSA (obstructive sleep apnea)    sleep study done in [place] in 2019 per pt   Rash 01/28/2022   Screening for colon cancer 08/01/2021   Past Surgical History:  Procedure Laterality Date   COLONOSCOPY     COLONOSCOPY N/A 09/03/2021   Procedure: COLONOSCOPY;  Surgeon: Midge Minium, MD;  Location: [facility] ENDOSCOPY;  Service: Endoscopy;  Laterality: N/A;   SHOULDER ARTHROSCOPY WITH ROTATOR CUFF REPAIR Left 07/07/2023   Procedure: LEFT SHOULDER ARTHROSCOPY / DEBRIDEMENT WITH ROTATOR CUFF REPAIR;  Surgeon: Huel Cote, MD;  Location: [facility];  Service: Orthopedics;  Laterality: Left;   TUBAL LIGATION     Patient Active Problem List   Diagnosis Date Noted   Diabetes mellitus treated with insulin and oral medication (HCC) 11/11/2023   Nontraumatic complete tear of left rotator cuff 07/07/2023   Uncontrolled type 2 diabetes mellitus with hyperglycemia (HCC) 07/25/2022   Depression, recurrent (HCC) 07/25/2022   Encounter for screening for malignant neoplasm of breast 07/11/2021   Hyperlipidemia associated  with type 2 diabetes mellitus (HCC) 07/11/2021   Personal history of noncompliance with medical treatment, presenting hazards to health 02/15/2019   History of syphilis 12/09/2018   Bursitis of hip 12/09/2018   Anemia 12/09/2018   Obesity 12/09/2018   OSA (obstructive sleep apnea)    HTN (hypertension)    Hypertension complicating diabetes (HCC)    Chronic low back pain    Chronic neck pain      REFERRING PROVIDER:  Huel Cote, MD    REFERRING DIAG: [phone] (ICD-10-CM) - Nontraumatic complete tear of left rotator cuff  S/p Lt RCR  Rationale for Evaluation and Treatment: Rehabilitation  THERAPY DIAG:  Muscle weakness (generalized)  Acute pain of left shoulder  Stiffness of left shoulder, not elsewhere classified  Chronic left shoulder pain  ONSET DATE: DOS 07/07/23   Days since surgery: 129  SUBJECTIVE:  SUBJECTIVE STATEMENT: Mild increase in shoulder pain, unsure if from the weather or from work. She report better management of back pain with stretches, though pain remains present.    Pt has been experiencing muscles spasms in low back and sciatica of new onset right before New Years. She saw PA on 1/3 and was prescribed muscle relaxers which have helped mildly. PA also sent a script for PT for this as well.  Plan to bring up concerns at next MD appt.   PERTINENT HISTORY:  DMT2, anemia, chronic neck pain  PAIN:  Are you having pain? PAIN:  Are you having pain? Yes: NPRS scale: 2/10  Pain location: LBP and L shoulder Pain description: Tightness Aggravating factors: Laying on L shoulder. Standing bothers low back Relieving factors: Heat, muscle relaxers    PRECAUTIONS:  Shoulder  RED FLAGS: None   WEIGHT BEARING RESTRICTIONS:  Yes POSTOPERATIVE PLAN: She will follow the  rotator cuff repair protocol.  She will be placed on aspirin for blood clot prevention.  She will be seen by physical therapy postop.  She will be seen at 2 weeks postop for suture removal  FALLS:  Has patient fallen in last 6 months? 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    OBJECTIVE:  Note: Objective measures were completed at Evaluation unless otherwise noted.  PATIENT SURVEYS:  FOTO 13 FOTO:45% 12/9  COGNITIVE STATUS: Within functional limits for tasks assessed   SENSATION: WFL  EDEMA:  No  POSTURE:  Eval: forward rounded shoulder as expected in sling  HAND DOMINANCE:  Right     Body Part #1 Shoulder  PALPATION: Eval: very guarded motion through shoulder  UPPER EXTREMITY ROM:  Passive ROM Right/Lt Eval 10/18 Left 12/23 Left 1/7  Shoulder flexion 20 113! 142  Shoulder extension     Shoulder abduction 0 86   Shoulder adduction     Shoulder extension     Shoulder internal rotation  75 75  Shoulder external rotation -10 48 59  Elbow flexion full    Elbow extension -20     (Blank rows = not tested)    Active  ROM Right/Lt Eval 10/18 Left 12/23 Left 1/7 Left  2/7  Shoulder flexion 20 113! 130 156   Shoulder extension      Shoulder abduction 0 86    Shoulder adduction      Shoulder extension      Shoulder internal rotation  75 Internal rotation to L5  IR to L5   Shoulder external rotation -10 48 Can reach behind her head  Full   Elbow flexion full     Elbow extension -20      (Blank rows = not tested)  MMT 4+/5 flexion and ER on left side   Lubar ROM:   Mild pain at end range extension and flexion     TREATMENT:  Treatment                            11/13/23: Blank lines  following charge title = not provided on this treatment date.   Manual:  PROM L shoulder GHJ distraction and inf glide grade II-III  There-ex:  Piriformis stretch LTR 5" x15 Lumbar stretch holding onto back of bike 5x10 seconds Standing shoulder flexion 2# x10 Standing abduction 2# x10 Standing OH press 2# 2x10 Standing press out 2# 2x10 Wall push ups 2x10  There-Act:  Self Care:  Nuro-Re-ed: PPT 5" x15 PPT with marching in hooklying 2x10 Gait Training:     Treatment                            11/06/23: Blank lines following charge title = not provided on this treatment date.   Manual:  TPDN No  There-ex: PROM L shoulder Piriformis stretch LTR 5" x20 Lumbar stretch holding onto back of bike 3x15 seconds Standing shoulder flexion 2# x10 Standing abduction 2# x10 Standing OH press 2# 2x10 Standing press out 2# 2x10 Wall push ups 2x10  There-Act:  Self Care:  Nuro-Re-ed: PPT 5" x15 PPT with marching in hooklying 2x10 Gait Training:    2/7 There-ex: LTR: x20   Gluteal stretch 3x20 sec hold   Reviewed for HEP Updated and reviewed HEP    Nuro-Re-ed : all core exercises performed in conjunction with TA breathing  Bridging 2x10  Hip abduction 2x10  Supine ABC 1x   There act:   shoulder flexion 2x10 Shoulder scaption 2x10   Mild pain  with flexion        Last visit:  Manual: Skilled palpation of trigger points. Trigger point release to bilateral gluteals and lower lumbar spine.  Review of self soft tissue mobilization   There-ex: LTR: x20   Gluteal stretch 3x20 sec hold   Reviewed for HEP Updated and reviewed HEP    Nuro-Re-ed : all core exercises performed in conjunction with TA breathing  Bridging 2x10  Hip abduction 2x10    Trigger Point Dry Needling  Initial Treatment: Pt instructed on Dry Needling rational, procedures, and possible side effects. Pt instructed to expect mild to moderate muscle soreness later in the day  and/or into the next day.  Pt instructed in methods to reduce muscle soreness. Pt instructed to continue prescribed HEP. Because Dry Needling was performed over or adjacent to a lung field, pt was educated on S/S of pneumothorax and to seek immediate medical attention should they occur.  Patient was educated on signs and symptoms of infection and other risk factors and advised to seek medical attention should they occur.  Patient verbalized understanding of these instructions and education.   Patient Verbal Consent Given: Yes Education Handout Provided: Yes Muscles Treated: bilateral upper gluteal and lower lumbar paraspinlas L5 1x in each spot .30x50 needle  Electrical Stimulation Performed: No Treatment Response/Outcome: great twitch      PATIENT EDUCATION:  Education details: Teacher, music of condition, POC, HEP, exercise form/rationale Person educated: Patient Education method: Explanation, Demonstration, Tactile cues, Verbal cues, and Handouts Education comprehension: verbalized understanding, returned demonstration, verbal cues required, tactile cues required, and needs further education  HOME EXERCISE PROGRAM: [email or web address]   ASSESSMENT:  CLINICAL IMPRESSION: Spent increased time on PROM and STM to biceps today to address increased tightness. She continues to respond well to lumbopelvic mobility and stretching interventions.  With shoulder strengthening she did report some increased fatigue with active flexion and abduction. Noted decreased range with this compared to previous sessions. Will continue to progress as tolerated.     REHAB POTENTIAL: Good  CLINICAL DECISION MAKING: Stable/uncomplicated  EVALUATION COMPLEXITY: Low   GOALS: Goals reviewed with patient? Yes  SHORT TERM GOALS: Target date: 4 wks (08/04/23)  Passive flexion to 140 deg without increase in pain Baseline: Goal status:achieved 12/30   2.  Passive abd to 60 deg without increase in pain Baseline:  Goal  status: 60 achieved 11/11  3.  Passive ER at side to 40 deg without increase in pain Baseline:  Goal status 18 11/11   4.  Able to demo proper scapular retraction for proximal shoulder girdle support Baseline:  Goal status: progressing 12/2   5.  Patient will report a 50% reduction in intensity and duration of radicular pain  Initial  6. Patient will increase lumbar flexion and extension ROM to full  LONG TERM GOALS: Target date: POC DATE  Able to demo AROM to shoulder height, against gravity, with good scapular control Baseline:  Goal status: IN PROGRESS  Target date:09/04/23 8 weeks has not begn active motion 12/2   2.  Will begin light resistance exercises pain <=3/10 Baseline:  Goal status: Initiated today with minor pain 12/18  Target date:  8 weeks  3.  Full AROM within 10 deg of opp UE without increase in pain Baseline:  Goal status: IN PROGRESS Target date: 10/02/23 12 weeks continues to be limited in active 12/26   4.  Proper scapular control in proprioceptive and CKC strengthening Baseline:  Goal status: INITIAL Target date: 10/03/23  12 weeks  5.  Able to lift cups/plates and other small objects into overhead cabinets without limitation by shoulder Baseline:  Goal status: IN PROGRESS Target date: 10/03/23  12 weeks  6. Patient will stand without out radicular pain for 30 min without pain.  IN PROGRESS   PLAN:  PT FREQUENCY: 1-2x/week  PT DURATION: 12 weeks  PLANNED INTERVENTIONS: 97164- PT Re-evaluation, 97110-Therapeutic exercises, 97530- Therapeutic activity, [id number]- Neuromuscular re-education, 97535- Self Care, 16109- Manual therapy, [id number]- Aquatic Therapy, 97014- Electrical stimulation (unattended), [phone]- Traction (mechanical), Patient/Family education, Taping, Dry Needling, Joint mobilization, Spinal mobilization, Scar mobilization, Cryotherapy, and Moist heat.  PLAN FOR NEXT SESSION: continue PROM per protocol; consider manual therapy to lumbar  spine. Progress core strengthening exercises.    Donnel Saxon Anice Wilshire, PTA 11/13/2023, 2:50 PM

## 2023-11-16 ENCOUNTER — Other Ambulatory Visit: Payer: 59

## 2023-11-16 DIAGNOSIS — Z7984 Long term (current) use of oral hypoglycemic drugs: Secondary | ICD-10-CM | POA: Diagnosis not present

## 2023-11-16 DIAGNOSIS — Z794 Long term (current) use of insulin: Secondary | ICD-10-CM | POA: Diagnosis not present

## 2023-11-16 DIAGNOSIS — I10 Essential (primary) hypertension: Secondary | ICD-10-CM | POA: Diagnosis not present

## 2023-11-16 DIAGNOSIS — E119 Type 2 diabetes mellitus without complications: Secondary | ICD-10-CM

## 2023-11-17 ENCOUNTER — Encounter: Payer: Self-pay | Admitting: Nurse Practitioner

## 2023-11-17 LAB — COMPREHENSIVE METABOLIC PANEL
ALT: 28 [IU]/L (ref 0–32)
AST: 21 [IU]/L (ref 0–40)
Albumin: 4.3 g/dL (ref 3.8–4.9)
Alkaline Phosphatase: 125 [IU]/L — ABNORMAL HIGH (ref 44–121)
BUN/Creatinine Ratio: 11 (ref 9–23)
BUN: 9 mg/dL (ref 6–24)
Bilirubin Total: 0.4 mg/dL (ref 0.0–1.2)
CO2: 23 mmol/L (ref 20–29)
Calcium: 9.4 mg/dL (ref 8.7–10.2)
Chloride: 100 mmol/L (ref 96–106)
Creatinine, Ser: 0.82 mg/dL (ref 0.57–1.00)
Globulin, Total: 3.3 g/dL (ref 1.5–4.5)
Glucose: 225 mg/dL — ABNORMAL HIGH (ref 70–99)
Potassium: 4.3 mmol/L (ref 3.5–5.2)
Sodium: 140 mmol/L (ref 134–144)
Total Protein: 7.6 g/dL (ref 6.0–8.5)
eGFR: 85 mL/min/{1.73_m2} (ref >59)

## 2023-11-17 LAB — HEMOGLOBIN A1C
Est. average glucose Bld gHb Est-mCnc: 249 mg/dL
Hgb A1c MFr Bld: 10.3 % — ABNORMAL HIGH (ref 4.8–5.6)

## 2023-11-20 ENCOUNTER — Encounter (HOSPITAL_BASED_OUTPATIENT_CLINIC_OR_DEPARTMENT_OTHER): Payer: Self-pay | Admitting: Physical Therapy

## 2023-11-20 ENCOUNTER — Ambulatory Visit (HOSPITAL_BASED_OUTPATIENT_CLINIC_OR_DEPARTMENT_OTHER): Payer: 59 | Admitting: Physical Therapy

## 2023-11-20 DIAGNOSIS — M25512 Pain in left shoulder: Secondary | ICD-10-CM | POA: Diagnosis not present

## 2023-11-20 DIAGNOSIS — G8929 Other chronic pain: Secondary | ICD-10-CM | POA: Diagnosis not present

## 2023-11-20 DIAGNOSIS — M25612 Stiffness of left shoulder, not elsewhere classified: Secondary | ICD-10-CM | POA: Diagnosis not present

## 2023-11-20 DIAGNOSIS — M6281 Muscle weakness (generalized): Secondary | ICD-10-CM | POA: Diagnosis not present

## 2023-11-20 NOTE — Therapy (Signed)
 OUTPATIENT PHYSICAL THERAPY TREATMENT   Patient Name: Cindy Carson MRN: [id number] DOB:25-Aug-1971, 53 y.o., female Today's Date: 11/22/2023  END OF SESSION:  PT End of Session - 11/22/23 1412     Visit Number 28    Number of Visits 37    Date for PT Re-Evaluation 12/11/23    Authorization Type UMR    Authorization Time Period 08/25/22 to 10/20/22    PT Start Time 1603    PT Stop Time 1645    PT Time Calculation (min) 42 min    Activity Tolerance Patient tolerated treatment well    Behavior During Therapy [facility] for tasks assessed/performed                             Past Medical History:  Diagnosis Date   Anemia 12/09/2018   Chronic low back pain    Chronic neck pain    Diabetes type 2, uncontrolled    diagnosed in 2019   Hyperlipidemia 08/01/2021   Hypertension complicating diabetes (HCC)    Neck pain 07/11/2021   Obesity 12/09/2018   OSA (obstructive sleep apnea)    sleep study done in [place] in 2019 per pt   Rash 01/28/2022   Screening for colon cancer 08/01/2021   Past Surgical History:  Procedure Laterality Date   COLONOSCOPY     COLONOSCOPY N/A 09/03/2021   Procedure: COLONOSCOPY;  Surgeon: Midge Minium, MD;  Location: [facility] ENDOSCOPY;  Service: Endoscopy;  Laterality: N/A;   SHOULDER ARTHROSCOPY WITH ROTATOR CUFF REPAIR Left 07/07/2023   Procedure: LEFT SHOULDER ARTHROSCOPY / DEBRIDEMENT WITH ROTATOR CUFF REPAIR;  Surgeon: Huel Cote, MD;  Location: [facility];  Service: Orthopedics;  Laterality: Left;   TUBAL LIGATION     Patient Active Problem List   Diagnosis Date Noted   Diabetes mellitus treated with insulin and oral medication (HCC) 11/11/2023   Nontraumatic complete tear of left rotator cuff 07/07/2023   Uncontrolled type 2 diabetes mellitus with hyperglycemia (HCC) 07/25/2022   Depression, recurrent (HCC) 07/25/2022   Encounter for screening for malignant neoplasm of breast 07/11/2021   Hyperlipidemia associated  with type 2 diabetes mellitus (HCC) 07/11/2021   Personal history of noncompliance with medical treatment, presenting hazards to health 02/15/2019   History of syphilis 12/09/2018   Bursitis of hip 12/09/2018   Anemia 12/09/2018   Obesity 12/09/2018   OSA (obstructive sleep apnea)    HTN (hypertension)    Hypertension complicating diabetes (HCC)    Chronic low back pain    Chronic neck pain      REFERRING PROVIDER:  Huel Cote, MD    REFERRING DIAG: [phone] (ICD-10-CM) - Nontraumatic complete tear of left rotator cuff  S/p Lt RCR  Rationale for Evaluation and Treatment: Rehabilitation  THERAPY DIAG:  Muscle weakness (generalized)  Acute pain of left shoulder  Stiffness of left shoulder, not elsewhere classified  Chronic left shoulder pain  ONSET DATE: DOS 07/07/23   Days since surgery: 136  SUBJECTIVE:  SUBJECTIVE STATEMENT: The patient reports her shoulder has felt good. She is still having pain in her back   Pt has been experiencing muscles spasms in low back and sciatica of new onset right before New Years. She saw PA on 1/3 and was prescribed muscle relaxers which have helped mildly. PA also sent a script for PT for this as well.  Plan to bring up concerns at next MD appt.   PERTINENT HISTORY:  DMT2, anemia, chronic neck pain  PAIN:  Are you having pain? PAIN:  Are you having pain? Yes: NPRS scale: 3/10  Pain location: Lower back  shoulder Pain description: Tightness Aggravating factors: Laying on L shoulder. Standing bothers low back Relieving factors: Heat, muscle relaxers    PRECAUTIONS:  Shoulder  RED FLAGS: None   WEIGHT BEARING RESTRICTIONS:  Yes POSTOPERATIVE PLAN: She will follow the rotator cuff repair protocol.  She will be placed on aspirin for blood clot  prevention.  She will be seen by physical therapy postop.  She will be seen at 2 weeks postop for suture removal  FALLS:  Has patient fallen in last 6 months? 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    OBJECTIVE:  Note: Objective measures were completed at Evaluation unless otherwise noted.  PATIENT SURVEYS:  FOTO 13 FOTO:45% 12/9  COGNITIVE STATUS: Within functional limits for tasks assessed   SENSATION: WFL  EDEMA:  No  POSTURE:  Eval: forward rounded shoulder as expected in sling  HAND DOMINANCE:  Right     Body Part #1 Shoulder  PALPATION: Eval: very guarded motion through shoulder  UPPER EXTREMITY ROM:  Passive ROM Right/Lt Eval 10/18 Left 12/23 Left 1/7  Shoulder flexion 20 113! 142  Shoulder extension     Shoulder abduction 0 86   Shoulder adduction     Shoulder extension     Shoulder internal rotation  75 75  Shoulder external rotation -10 48 59  Elbow flexion full    Elbow extension -20     (Blank rows = not tested)    Active  ROM Right/Lt Eval 10/18 Left 12/23 Left 1/7 Left  2/7  Shoulder flexion 20 113! 130 156   Shoulder extension      Shoulder abduction 0 86    Shoulder adduction      Shoulder extension      Shoulder internal rotation  75 Internal rotation to L5  IR to L5   Shoulder external rotation -10 48 Can reach behind her head  Full   Elbow flexion full     Elbow extension -20      (Blank rows = not tested)  MMT 4+/5 flexion and ER on left side   Lubar ROM:   Mild pain at end range extension and flexion     TREATMENT:  2/28 There-ex: LTR: x20   Gluteal stretch 3x20 sec hold  Ball roll fwd and lateal 5x10 sec hold   Reviewed for HEP Updated and  reviewed HEP    Nuro-Re-ed : all core exercises performed in conjunction with TA breathing  Bridging 2x10  Supine march 2x10  Shoulder extension with TA breathing 2x15  Shoulder row with TA breathing 2 x15 green  Ball press with TA breathing 2x0 with abdominal bracing       Treatment                            11/13/23: Blank lines following charge title = not provided on this treatment date.   Manual:  PROM L shoulder GHJ distraction and inf glide grade II-III  There-ex:  Piriformis stretch LTR 5" x15 Lumbar stretch holding onto back of bike 5x10 seconds Standing shoulder flexion 2# x10 Standing abduction 2# x10 Standing OH press 2# 2x10 Standing press out 2# 2x10 Wall push ups 2x10  There-Act:  Self Care:  Nuro-Re-ed: PPT 5" x15 PPT with marching in hooklying 2x10 Gait Training:     Treatment                            11/06/23: Blank lines following charge title = not provided on this treatment date.   Manual:  TPDN No  There-ex: PROM L shoulder Piriformis stretch LTR 5" x20 Lumbar stretch holding onto back of bike 3x15 seconds Standing shoulder flexion 2# x10 Standing abduction 2# x10 Standing OH press 2# 2x10 Standing press out 2# 2x10 Wall push ups 2x10  There-Act:  Self Care:  Nuro-Re-ed: PPT 5" x15 PPT with marching in hooklying 2x10 Gait Training:    2/7 There-ex: LTR: x20   Gluteal stretch 3x20 sec hold   Reviewed for HEP Updated and reviewed HEP    Nuro-Re-ed : all core exercises performed in conjunction with TA breathing  Bridging 2x10  Hip abduction 2x10  Supine ABC 1x   There act:   shoulder flexion 2x10 Shoulder scaption 2x10   Mild pain  with flexion        Last visit:  Manual: Skilled palpation of trigger points. Trigger point release to bilateral gluteals and lower lumbar spine.  Review of self soft tissue mobilization   There-ex: LTR: x20   Gluteal stretch 3x20 sec hold   Reviewed for  HEP Updated and reviewed HEP    Nuro-Re-ed : all core exercises performed in conjunction with TA breathing  Bridging 2x10  Hip abduction 2x10    Trigger Point Dry Needling  Initial Treatment: Pt instructed on Dry Needling rational, procedures, and possible side effects. Pt instructed to expect mild to moderate muscle soreness later in the day and/or into the next day.  Pt instructed in methods to reduce muscle soreness. Pt instructed to continue prescribed HEP. Because Dry Needling was performed over or adjacent to a lung field, pt was educated on S/S of pneumothorax and to seek immediate medical attention should they occur.  Patient was educated on signs and symptoms of infection and other risk factors and advised to seek medical attention should they occur.  Patient verbalized understanding of these instructions and education.   Patient Verbal Consent Given: Yes Education Handout Provided: Yes Muscles Treated: bilateral upper gluteal and lower lumbar paraspinlas L5 1x in each spot .30x50 needle  Electrical Stimulation Performed: No Treatment Response/Outcome: great twitch  PATIENT EDUCATION:  Education details: Teacher, music of condition, POC, HEP, exercise form/rationale Person educated: Patient Education method: Explanation, Demonstration, Tactile cues, Verbal cues, and Handouts Education comprehension: verbalized understanding, returned demonstration, verbal cues required, tactile cues required, and needs further education  HOME EXERCISE PROGRAM: [id number]   ASSESSMENT:  CLINICAL IMPRESSION: Therapy will continue to work on core stability exercises for the lower back. Her back has improved some but its still limited. She was encouraged to continue her exercises at home. We reviewed proper posture with shoulder exercises.     REHAB POTENTIAL: Good  CLINICAL DECISION MAKING: Stable/uncomplicated  EVALUATION COMPLEXITY: Low   GOALS: Goals reviewed with patient?  Yes  SHORT TERM GOALS: Target date: 4 wks (08/04/23)  Passive flexion to 140 deg without increase in pain Baseline: Goal status:achieved 12/30   2.  Passive abd to 60 deg without increase in pain Baseline:  Goal status: 60 achieved 11/11  3.  Passive ER at side to 40 deg without increase in pain Baseline:  Goal status 18 11/11   4.  Able to demo proper scapular retraction for proximal shoulder girdle support Baseline:  Goal status: progressing 12/2   5.  Patient will report a 50% reduction in intensity and duration of radicular pain  Initial  6. Patient will increase lumbar flexion and extension ROM to full  LONG TERM GOALS: Target date: POC DATE  Able to demo AROM to shoulder height, against gravity, with good scapular control Baseline:  Goal status: IN PROGRESS  Target date:09/04/23 8 weeks has not begn active motion 12/2   2.  Will begin light resistance exercises pain <=3/10 Baseline:  Goal status: Initiated today with minor pain 12/18  Target date:  8 weeks  3.  Full AROM within 10 deg of opp UE without increase in pain Baseline:  Goal status: IN PROGRESS Target date: 10/02/23 12 weeks continues to be limited in active 12/26   4.  Proper scapular control in proprioceptive and CKC strengthening Baseline:  Goal status: INITIAL Target date: 10/03/23  12 weeks  5.  Able to lift cups/plates and other small objects into overhead cabinets without limitation by shoulder Baseline:  Goal status: IN PROGRESS Target date: 10/03/23  12 weeks  6. Patient will stand without out radicular pain for 30 min without pain.  IN PROGRESS   PLAN:  PT FREQUENCY: 1-2x/week  PT DURATION: 12 weeks  PLANNED INTERVENTIONS: 97164- PT Re-evaluation, 97110-Therapeutic exercises, 97530- Therapeutic activity, [id number]- Neuromuscular re-education, 97535- Self Care, 01027- Manual therapy, [id number]- Aquatic Therapy, 97014- Electrical stimulation (unattended), [phone]- Traction (mechanical),  Patient/Family education, Taping, Dry Needling, Joint mobilization, Spinal mobilization, Scar mobilization, Cryotherapy, and Moist heat.  PLAN FOR NEXT SESSION: continue PROM per protocol; consider manual therapy to lumbar spine. Progress core strengthening exercises.    Dessie Coma, PT 11/22/2023, 2:14 PM

## 2023-11-22 ENCOUNTER — Encounter (HOSPITAL_BASED_OUTPATIENT_CLINIC_OR_DEPARTMENT_OTHER): Payer: Self-pay | Admitting: Physical Therapy

## 2023-11-27 ENCOUNTER — Ambulatory Visit (HOSPITAL_BASED_OUTPATIENT_CLINIC_OR_DEPARTMENT_OTHER): Payer: 59 | Attending: Orthopaedic Surgery

## 2023-11-27 ENCOUNTER — Encounter (HOSPITAL_BASED_OUTPATIENT_CLINIC_OR_DEPARTMENT_OTHER): Payer: Self-pay

## 2023-11-27 DIAGNOSIS — M6281 Muscle weakness (generalized): Secondary | ICD-10-CM | POA: Insufficient documentation

## 2023-11-27 DIAGNOSIS — M25612 Stiffness of left shoulder, not elsewhere classified: Secondary | ICD-10-CM | POA: Diagnosis not present

## 2023-11-27 DIAGNOSIS — G8929 Other chronic pain: Secondary | ICD-10-CM | POA: Diagnosis not present

## 2023-11-27 DIAGNOSIS — M25512 Pain in left shoulder: Secondary | ICD-10-CM | POA: Diagnosis not present

## 2023-11-27 NOTE — Therapy (Signed)
 OUTPATIENT PHYSICAL THERAPY TREATMENT   Patient Name: Cindy Carson MRN: [id number] DOB:1971-07-09, 53 y.o., female Today's Date: 11/27/2023  END OF SESSION:  PT End of Session - 11/27/23 1641     Visit Number 29    Number of Visits 37    Date for PT Re-Evaluation 12/11/23    Authorization Type UMR    Authorization Time Period 08/25/22 to 10/20/22    PT Start Time 1614    PT Stop Time 1650    PT Time Calculation (min) 36 min    Activity Tolerance Patient tolerated treatment well    Behavior During Therapy [facility] for tasks assessed/performed                              Past Medical History:  Diagnosis Date   Anemia 12/09/2018   Chronic low back pain    Chronic neck pain    Diabetes type 2, uncontrolled    diagnosed in 2019   Hyperlipidemia 08/01/2021   Hypertension complicating diabetes (HCC)    Neck pain 07/11/2021   Obesity 12/09/2018   OSA (obstructive sleep apnea)    sleep study done in [place] in 2019 per pt   Rash 01/28/2022   Screening for colon cancer 08/01/2021   Past Surgical History:  Procedure Laterality Date   COLONOSCOPY     COLONOSCOPY N/A 09/03/2021   Procedure: COLONOSCOPY;  Surgeon: Midge Minium, MD;  Location: [facility] ENDOSCOPY;  Service: Endoscopy;  Laterality: N/A;   SHOULDER ARTHROSCOPY WITH ROTATOR CUFF REPAIR Left 07/07/2023   Procedure: LEFT SHOULDER ARTHROSCOPY / DEBRIDEMENT WITH ROTATOR CUFF REPAIR;  Surgeon: Huel Cote, MD;  Location: [facility];  Service: Orthopedics;  Laterality: Left;   TUBAL LIGATION     Patient Active Problem List   Diagnosis Date Noted   Diabetes mellitus treated with insulin and oral medication (HCC) 11/11/2023   Nontraumatic complete tear of left rotator cuff 07/07/2023   Uncontrolled type 2 diabetes mellitus with hyperglycemia (HCC) 07/25/2022   Depression, recurrent (HCC) 07/25/2022   Encounter for screening for malignant neoplasm of breast 07/11/2021   Hyperlipidemia  associated with type 2 diabetes mellitus (HCC) 07/11/2021   Personal history of noncompliance with medical treatment, presenting hazards to health 02/15/2019   History of syphilis 12/09/2018   Bursitis of hip 12/09/2018   Anemia 12/09/2018   Obesity 12/09/2018   OSA (obstructive sleep apnea)    HTN (hypertension)    Hypertension complicating diabetes (HCC)    Chronic low back pain    Chronic neck pain      REFERRING PROVIDER:  Huel Cote, MD    REFERRING DIAG: [phone] (ICD-10-CM) - Nontraumatic complete tear of left rotator cuff  S/p Lt RCR  Rationale for Evaluation and Treatment: Rehabilitation  THERAPY DIAG:  Muscle weakness (generalized)  Acute pain of left shoulder  Stiffness of left shoulder, not elsewhere classified  ONSET DATE: DOS 07/07/23   Days since surgery: 143  SUBJECTIVE:  SUBJECTIVE STATEMENT: The patient reports her shoulder has felt good. She is still having pain in her back   Pt has been experiencing muscles spasms in low back and sciatica of new onset right before New Years. She saw PA on 1/3 and was prescribed muscle relaxers which have helped mildly. PA also sent a script for PT for this as well.  Plan to bring up concerns at next MD appt.   PERTINENT HISTORY:  DMT2, anemia, chronic neck pain  PAIN:  Are you having pain? PAIN:  Are you having pain? Yes: NPRS scale: 3/10  Pain location: Lower back  shoulder Pain description: Tightness Aggravating factors: Laying on L shoulder. Standing bothers low back Relieving factors: Heat, muscle relaxers    PRECAUTIONS:  Shoulder  RED FLAGS: None   WEIGHT BEARING RESTRICTIONS:  Yes POSTOPERATIVE PLAN: She will follow the rotator cuff repair protocol.  She will be placed on aspirin for blood clot prevention.  She  will be seen by physical therapy postop.  She will be seen at 2 weeks postop for suture removal  FALLS:  Has patient fallen in last 6 months? 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    OBJECTIVE:  Note: Objective measures were completed at Evaluation unless otherwise noted.  PATIENT SURVEYS:  FOTO 13 FOTO:45% 12/9  COGNITIVE STATUS: Within functional limits for tasks assessed   SENSATION: WFL  EDEMA:  No  POSTURE:  Eval: forward rounded shoulder as expected in sling  HAND DOMINANCE:  Right     Body Part #1 Shoulder  PALPATION: Eval: very guarded motion through shoulder  UPPER EXTREMITY ROM:  Passive ROM Right/Lt Eval 10/18 Left 12/23 Left 1/7  Shoulder flexion 20 113! 142  Shoulder extension     Shoulder abduction 0 86   Shoulder adduction     Shoulder extension     Shoulder internal rotation  75 75  Shoulder external rotation -10 48 59  Elbow flexion full    Elbow extension -20     (Blank rows = not tested)    Active  ROM Right/Lt Eval 10/18 Left 12/23 Left 1/7 Left  2/7  Shoulder flexion 20 113! 130 156   Shoulder extension      Shoulder abduction 0 86    Shoulder adduction      Shoulder extension      Shoulder internal rotation  75 Internal rotation to L5  IR to L5   Shoulder external rotation -10 48 Can reach behind her head  Full   Elbow flexion full     Elbow extension -20      (Blank rows = not tested)  MMT 4+/5 flexion and ER on left side   Lubar ROM:   Mild pain at end range extension and flexion     TREATMENT:  3/7 There-ex: LTR: x20  PROM L shoulder Piriformis stretch LTR 5" x20 Lumbar stretch holding onto back of bike 3x15 seconds Standing shoulder flexion 2#  x10 Standing abduction 2# x10 Standing OH press 2# 2x10 Theraband row GTB 2x15 Theraband extension GTB 2x15          2/28 There-ex: LTR: x20   Gluteal stretch 3x20 sec hold  Ball roll fwd and lateal 5x10 sec hold   Reviewed for HEP Updated and reviewed HEP    Nuro-Re-ed : all core exercises performed in conjunction with TA breathing  Bridging 2x10  Supine march 2x10  Shoulder extension with TA breathing 2x15  Shoulder row with TA breathing 2 x15 green  Ball press with TA breathing 2x0 with abdominal bracing       Treatment                            11/13/23: Blank lines following charge title = not provided on this treatment date.   Manual:  PROM L shoulder GHJ distraction and inf glide grade II-III  There-ex:  Piriformis stretch LTR 5" x15 Lumbar stretch holding onto back of bike 5x10 seconds Standing shoulder flexion 2# x10 Standing abduction 2# x10 Standing OH press 2# 2x10 Standing press out 2# 2x10 Wall push ups 2x10  There-Act:  Self Care:  Nuro-Re-ed: PPT 5" x15 PPT with marching in hooklying 2x10 Gait Training:     Treatment                            11/06/23: Blank lines following charge title = not provided on this treatment date.   Manual:  TPDN No  There-ex: PROM L shoulder Piriformis stretch LTR 5" x20 Lumbar stretch holding onto back of bike 3x15 seconds Standing shoulder flexion 2# x10 Standing abduction 2# x10 Standing OH press 2# 2x10 Standing press out 2# 2x10 Wall push ups 2x10  There-Act:  Self Care:  Nuro-Re-ed: PPT 5" x15 PPT with marching in hooklying 2x10 Gait Training:    2/7 There-ex: LTR: x20   Gluteal stretch 3x20 sec hold   Reviewed for HEP Updated and reviewed HEP    Nuro-Re-ed : all core exercises performed in conjunction with TA breathing  Bridging 2x10  Hip abduction 2x10  Supine ABC 1x   There act:   shoulder flexion 2x10 Shoulder scaption 2x10   Mild pain  with flexion         Last visit:  Manual: Skilled palpation of trigger points. Trigger point release to bilateral gluteals and lower lumbar spine.  Review of self soft tissue mobilization   There-ex: LTR: x20   Gluteal stretch 3x20 sec hold   Reviewed for HEP Updated and reviewed HEP    Nuro-Re-ed : all core exercises performed in conjunction with TA breathing  Bridging 2x10  Hip abduction 2x10    Trigger Point Dry Needling  Initial Treatment: Pt instructed on Dry Needling rational, procedures, and possible side effects. Pt instructed to expect mild to moderate muscle soreness later in the day and/or into the next day.  Pt instructed in methods to reduce muscle soreness. Pt instructed to continue prescribed HEP. Because Dry Needling was performed over or adjacent to a lung field, pt was educated on S/S of pneumothorax and to seek immediate medical attention should they occur.  Patient was educated on signs and symptoms of infection and other risk factors and  advised to seek medical attention should they occur.  Patient verbalized understanding of these instructions and education.   Patient Verbal Consent Given: Yes Education Handout Provided: Yes Muscles Treated: bilateral upper gluteal and lower lumbar paraspinlas L5 1x in each spot .30x50 needle  Electrical Stimulation Performed: No Treatment Response/Outcome: great twitch      PATIENT EDUCATION:  Education details: Anatomy of condition, POC, HEP, exercise form/rationale Person educated: Patient Education method: Explanation, Demonstration, Tactile cues, Verbal cues, and Handouts Education comprehension: verbalized understanding, returned demonstration, verbal cues required, tactile cues required, and needs further education  HOME EXERCISE PROGRAM: [email or web address]   ASSESSMENT:  CLINICAL IMPRESSION: Increased fatigue observed with shoulder AROM strengthening today. She did report mild discomfort with overhead press, so performed  second set without resistance. Pt reports improved self management of pain throughout the work day, though repetitive reaching tasks are challenging.    REHAB POTENTIAL: Good  CLINICAL DECISION MAKING: Stable/uncomplicated  EVALUATION COMPLEXITY: Low   GOALS: Goals reviewed with patient? Yes  SHORT TERM GOALS: Target date: 4 wks (08/04/23)  Passive flexion to 140 deg without increase in pain Baseline: Goal status:achieved 12/30   2.  Passive abd to 60 deg without increase in pain Baseline:  Goal status: 60 achieved 11/11  3.  Passive ER at side to 40 deg without increase in pain Baseline:  Goal status 18 11/11   4.  Able to demo proper scapular retraction for proximal shoulder girdle support Baseline:  Goal status: progressing 12/2   5.  Patient will report a 50% reduction in intensity and duration of radicular pain  Initial  6. Patient will increase lumbar flexion and extension ROM to full  LONG TERM GOALS: Target date: POC DATE  Able to demo AROM to shoulder height, against gravity, with good scapular control Baseline:  Goal status: IN PROGRESS  Target date:09/04/23 8 weeks has not begn active motion 12/2   2.  Will begin light resistance exercises pain <=3/10 Baseline:  Goal status: Initiated today with minor pain 12/18  Target date:  8 weeks  3.  Full AROM within 10 deg of opp UE without increase in pain Baseline:  Goal status: IN PROGRESS Target date: 10/02/23 12 weeks continues to be limited in active 12/26   4.  Proper scapular control in proprioceptive and CKC strengthening Baseline:  Goal status: INITIAL Target date: 10/03/23  12 weeks  5.  Able to lift cups/plates and other small objects into overhead cabinets without limitation by shoulder Baseline:  Goal status: IN PROGRESS Target date: 10/03/23  12 weeks  6. Patient will stand without out radicular pain for 30 min without pain.  IN PROGRESS   PLAN:  PT FREQUENCY: 1-2x/week  PT  DURATION: 12 weeks  PLANNED INTERVENTIONS: 97164- PT Re-evaluation, 97110-Therapeutic exercises, 97530- Therapeutic activity, [id number]- Neuromuscular re-education, 97535- Self Care, 54098- Manual therapy, [id number]- Aquatic Therapy, 97014- Electrical stimulation (unattended), [phone]- Traction (mechanical), Patient/Family education, Taping, Dry Needling, Joint mobilization, Spinal mobilization, Scar mobilization, Cryotherapy, and Moist heat.  PLAN FOR NEXT SESSION: continue PROM per protocol; consider manual therapy to lumbar spine. Progress core strengthening exercises.    Donnel Saxon Charlisha Market, PTA 11/27/2023, 5:12 PM

## 2023-12-04 ENCOUNTER — Ambulatory Visit (HOSPITAL_BASED_OUTPATIENT_CLINIC_OR_DEPARTMENT_OTHER): Payer: 59

## 2023-12-04 ENCOUNTER — Encounter (HOSPITAL_BASED_OUTPATIENT_CLINIC_OR_DEPARTMENT_OTHER): Payer: Self-pay

## 2023-12-04 DIAGNOSIS — M25612 Stiffness of left shoulder, not elsewhere classified: Secondary | ICD-10-CM | POA: Diagnosis not present

## 2023-12-04 DIAGNOSIS — M6281 Muscle weakness (generalized): Secondary | ICD-10-CM | POA: Diagnosis not present

## 2023-12-04 DIAGNOSIS — M25512 Pain in left shoulder: Secondary | ICD-10-CM | POA: Diagnosis not present

## 2023-12-04 DIAGNOSIS — G8929 Other chronic pain: Secondary | ICD-10-CM | POA: Diagnosis not present

## 2023-12-04 NOTE — Therapy (Signed)
 OUTPATIENT PHYSICAL THERAPY TREATMENT   Patient Name: Cindy Carson MRN: [id number] DOB:[birth year], 53 y.o., female Today's Date: 12/04/2023  END OF SESSION:  PT End of Session - 12/04/23 1607     Visit Number 30    Number of Visits 37    Date for PT Re-Evaluation 12/11/23    Authorization Type UMR    Authorization Time Period 08/25/22 to 10/20/22    PT Start Time 1604    PT Stop Time 1645    PT Time Calculation (min) 41 min    Activity Tolerance Patient tolerated treatment well    Behavior During Therapy [facility] for tasks assessed/performed                               Past Medical History:  Diagnosis Date   Anemia 12/09/2018   Chronic low back pain    Chronic neck pain    Diabetes type 2, uncontrolled    diagnosed in 2019   Hyperlipidemia 08/01/2021   Hypertension complicating diabetes (HCC)    Neck pain 07/11/2021   Obesity 12/09/2018   OSA (obstructive sleep apnea)    sleep study done in [place] in 2019 per pt   Rash 01/28/2022   Screening for colon cancer 08/01/2021   Past Surgical History:  Procedure Laterality Date   COLONOSCOPY     COLONOSCOPY N/A 09/03/2021   Procedure: COLONOSCOPY;  Surgeon: Midge Minium, MD;  Location: [facility] ENDOSCOPY;  Service: Endoscopy;  Laterality: N/A;   SHOULDER ARTHROSCOPY WITH ROTATOR CUFF REPAIR Left 07/07/2023   Procedure: LEFT SHOULDER ARTHROSCOPY / DEBRIDEMENT WITH ROTATOR CUFF REPAIR;  Surgeon: Huel Cote, MD;  Location: [facility];  Service: Orthopedics;  Laterality: Left;   TUBAL LIGATION     Patient Active Problem List   Diagnosis Date Noted   Diabetes mellitus treated with insulin and oral medication (HCC) 11/11/2023   Nontraumatic complete tear of left rotator cuff 07/07/2023   Uncontrolled type 2 diabetes mellitus with hyperglycemia (HCC) 07/25/2022   Depression, recurrent (HCC) 07/25/2022   Encounter for screening for malignant neoplasm of breast 07/11/2021   Hyperlipidemia  associated with type 2 diabetes mellitus (HCC) 07/11/2021   Personal history of noncompliance with medical treatment, presenting hazards to health 02/15/2019   History of syphilis 12/09/2018   Bursitis of hip 12/09/2018   Anemia 12/09/2018   Obesity 12/09/2018   OSA (obstructive sleep apnea)    HTN (hypertension)    Hypertension complicating diabetes (HCC)    Chronic low back pain    Chronic neck pain      REFERRING PROVIDER:  Huel Cote, MD    REFERRING DIAG: [phone] (ICD-10-CM) - Nontraumatic complete tear of left rotator cuff  S/p Lt RCR  Rationale for Evaluation and Treatment: Rehabilitation  THERAPY DIAG:  Muscle weakness (generalized)  Acute pain of left shoulder  Stiffness of left shoulder, not elsewhere classified  ONSET DATE: DOS 07/07/23   Days since surgery: 150  SUBJECTIVE:  SUBJECTIVE STATEMENT: The patient reports her shoulder has felt good. She is still having pain in her back   Pt has been experiencing muscles spasms in low back and sciatica of new onset right before New Years. She saw PA on 1/3 and was prescribed muscle relaxers which have helped mildly. PA also sent a script for PT for this as well.  Plan to bring up concerns at next MD appt.   PERTINENT HISTORY:  DMT2, anemia, chronic neck pain  PAIN:  Are you having pain? PAIN:  Are you having pain? Yes: NPRS scale: 3/10  Pain location: Lower back  shoulder Pain description: Tightness Aggravating factors: Laying on L shoulder. Standing bothers low back Relieving factors: Heat, muscle relaxers    PRECAUTIONS:  Shoulder  RED FLAGS: None   WEIGHT BEARING RESTRICTIONS:  Yes POSTOPERATIVE PLAN: She will follow the rotator cuff repair protocol.  She will be placed on aspirin for blood clot prevention.  She  will be seen by physical therapy postop.  She will be seen at 2 weeks postop for suture removal  FALLS:  Has patient fallen in last 6 months? 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    OBJECTIVE:  Note: Objective measures were completed at Evaluation unless otherwise noted.  PATIENT SURVEYS:  FOTO 13 FOTO:45% 12/9  COGNITIVE STATUS: Within functional limits for tasks assessed   SENSATION: WFL  EDEMA:  No  POSTURE:  Eval: forward rounded shoulder as expected in sling  HAND DOMINANCE:  Right     Body Part #1 Shoulder  PALPATION: Eval: very guarded motion through shoulder  UPPER EXTREMITY ROM:  Passive ROM Right/Lt Eval 10/18 Left 12/23 Left 1/7  Shoulder flexion 20 113! 142  Shoulder extension     Shoulder abduction 0 86   Shoulder adduction     Shoulder extension     Shoulder internal rotation  75 75  Shoulder external rotation -10 48 59  Elbow flexion full    Elbow extension -20     (Blank rows = not tested)    Active  ROM Right/Lt Eval 10/18 Left 12/23 Left 1/7 Left  2/7  Shoulder flexion 20 113! 130 156   Shoulder extension      Shoulder abduction 0 86    Shoulder adduction      Shoulder extension      Shoulder internal rotation  75 Internal rotation to L5  IR to L5   Shoulder external rotation -10 48 Can reach behind her head  Full   Elbow flexion full     Elbow extension -20      (Blank rows = not tested)  MMT 4+/5 flexion and ER on left side   Lubar ROM:   Mild pain at end range extension and flexion     TREATMENT:  3/14 There-ex: PROM L shoulder STM to deltoids and bicep, UT LTR 5" x20 Supine bil ER with RTB 2x10 Standing shoulder flexion 2# x10 Standing  abduction 2# x10 Ball rolls 4 way at wall 2.2# ball x15ea Wall push ups 2x10 Theraband row GTB 2x15 Theraband extension GTB 2x15  3/7 There-ex: LTR: x20  PROM L shoulder Piriformis stretch LTR 5" x20 Lumbar stretch holding onto back of bike 3x15 seconds Standing shoulder flexion 2# x10 Standing abduction 2# x10 Standing OH press 2# 2x10 Theraband row GTB 2x15 Theraband extension GTB 2x15          2/28 There-ex: LTR: x20   Gluteal stretch 3x20 sec hold  Ball roll fwd and lateal 5x10 sec hold   Reviewed for HEP Updated and reviewed HEP    Nuro-Re-ed : all core exercises performed in conjunction with TA breathing  Bridging 2x10  Supine march 2x10  Shoulder extension with TA breathing 2x15  Shoulder row with TA breathing 2 x15 green  Ball press with TA breathing 2x0 with abdominal bracing       Treatment                            11/13/23: Blank lines following charge title = not provided on this treatment date.   Manual:  PROM L shoulder GHJ distraction and inf glide grade II-III  There-ex:  Piriformis stretch LTR 5" x15 Lumbar stretch holding onto back of bike 5x10 seconds Standing shoulder flexion 2# x10 Standing abduction 2# x10 Standing OH press 2# 2x10 Standing press out 2# 2x10 Wall push ups 2x10  There-Act:  Self Care:  Nuro-Re-ed: PPT 5" x15 PPT with marching in hooklying 2x10 Gait Training:     Treatment                            11/06/23: Blank lines following charge title = not provided on this treatment date.   Manual:  TPDN No  There-ex: PROM L shoulder Piriformis stretch LTR 5" x20 Lumbar stretch holding onto back of bike 3x15 seconds Standing shoulder flexion 2# x10 Standing abduction 2# x10 Standing OH press 2# 2x10 Standing press out 2# 2x10 Wall push ups 2x10  There-Act:  Self Care:  Nuro-Re-ed: PPT 5" x15 PPT with marching in hooklying 2x10 Gait Training:    2/7 There-ex: LTR: x20   Gluteal  stretch 3x20 sec hold   Reviewed for HEP Updated and reviewed HEP    Nuro-Re-ed : all core exercises performed in conjunction with TA breathing  Bridging 2x10  Hip abduction 2x10  Supine ABC 1x   There act:   shoulder flexion 2x10 Shoulder scaption 2x10   Mild pain  with flexion        Last visit:  Manual: Skilled palpation of trigger points. Trigger point release to bilateral gluteals and lower lumbar spine.  Review of self soft tissue mobilization   There-ex: LTR: x20   Gluteal stretch 3x20 sec hold   Reviewed for HEP Updated and reviewed HEP    Nuro-Re-ed : all core exercises performed in conjunction with TA breathing  Bridging 2x10  Hip abduction 2x10    Trigger Point Dry Needling  Initial Treatment: Pt instructed on Dry Needling rational, procedures, and possible side effects. Pt instructed to expect mild to moderate muscle soreness later in the day and/or into the next day.  Pt instructed in methods to reduce  muscle soreness. Pt instructed to continue prescribed HEP. Because Dry Needling was performed over or adjacent to a lung field, pt was educated on S/S of pneumothorax and to seek immediate medical attention should they occur.  Patient was educated on signs and symptoms of infection and other risk factors and advised to seek medical attention should they occur.  Patient verbalized understanding of these instructions and education.   Patient Verbal Consent Given: Yes Education Handout Provided: Yes Muscles Treated: bilateral upper gluteal and lower lumbar paraspinlas L5 1x in each spot .30x50 needle  Electrical Stimulation Performed: No Treatment Response/Outcome: great twitch      PATIENT EDUCATION:  Education details: Anatomy of condition, POC, HEP, exercise form/rationale Person educated: Patient Education method: Explanation, Demonstration, Tactile cues, Verbal cues, and Handouts Education comprehension: verbalized understanding, returned  demonstration, verbal cues required, tactile cues required, and needs further education  HOME EXERCISE PROGRAM: [id number]   ASSESSMENT:  CLINICAL IMPRESSION: Patient remains challenged with standing abduction with 2 pound weight.  Had to decrease to 1 pound weight with improved performance.  Instructed patient to continue working on active strengthening outside of clinic.  Discussed posture while sitting at desk as she reports her neck has been bothering her along with her shoulder when sitting for a long time at work.  Spent time on STM to left upper trap, deltoid, and biceps to decrease tightness present here.  Will continue to progress active strengthening to improve her functional capacity.  REHAB POTENTIAL: Good  CLINICAL DECISION MAKING: Stable/uncomplicated  EVALUATION COMPLEXITY: Low   GOALS: Goals reviewed with patient? Yes  SHORT TERM GOALS: Target date: 4 wks (08/04/23)  Passive flexion to 140 deg without increase in pain Baseline: Goal status:achieved 12/30   2.  Passive abd to 60 deg without increase in pain Baseline:  Goal status: 60 achieved 11/11  3.  Passive ER at side to 40 deg without increase in pain Baseline:  Goal status 18 11/11   4.  Able to demo proper scapular retraction for proximal shoulder girdle support Baseline:  Goal status: progressing 12/2   5.  Patient will report a 50% reduction in intensity and duration of radicular pain  Initial  6. Patient will increase lumbar flexion and extension ROM to full  LONG TERM GOALS: Target date: POC DATE  Able to demo AROM to shoulder height, against gravity, with good scapular control Baseline:  Goal status: IN PROGRESS  Target date:09/04/23 8 weeks has not begn active motion 12/2   2.  Will begin light resistance exercises pain <=3/10 Baseline:  Goal status: Initiated today with minor pain 12/18  Target date:  8 weeks  3.  Full AROM within 10 deg of opp UE without increase in pain Baseline:   Goal status: IN PROGRESS Target date: 10/02/23 12 weeks continues to be limited in active 12/26   4.  Proper scapular control in proprioceptive and CKC strengthening Baseline:  Goal status: INITIAL Target date: 10/03/23  12 weeks  5.  Able to lift cups/plates and other small objects into overhead cabinets without limitation by shoulder Baseline:  Goal status: IN PROGRESS Target date: 10/03/23  12 weeks  6. Patient will stand without out radicular pain for 30 min without pain.  IN PROGRESS   PLAN:  PT FREQUENCY: 1-2x/week  PT DURATION: 12 weeks  PLANNED INTERVENTIONS: 97164- PT Re-evaluation, 97110-Therapeutic exercises, 97530- Therapeutic activity, [id number]- Neuromuscular re-education, 97535- Self Care, 54098- Manual therapy, [id number]- Aquatic Therapy, 97014- Electrical stimulation (unattended), [id number]- Traction (mechanical),  Patient/Family education, Taping, Dry Needling, Joint mobilization, Spinal mobilization, Scar mobilization, Cryotherapy, and Moist heat.  PLAN FOR NEXT SESSION: continue PROM per protocol; consider manual therapy to lumbar spine. Progress core strengthening exercises.    Donnel Saxon Lashika Erker, PTA 12/04/2023, 5:09 PM

## 2023-12-10 ENCOUNTER — Encounter (HOSPITAL_BASED_OUTPATIENT_CLINIC_OR_DEPARTMENT_OTHER): Payer: Self-pay

## 2023-12-10 ENCOUNTER — Ambulatory Visit (HOSPITAL_BASED_OUTPATIENT_CLINIC_OR_DEPARTMENT_OTHER): Payer: 59

## 2023-12-10 DIAGNOSIS — M25612 Stiffness of left shoulder, not elsewhere classified: Secondary | ICD-10-CM

## 2023-12-10 DIAGNOSIS — G8929 Other chronic pain: Secondary | ICD-10-CM | POA: Diagnosis not present

## 2023-12-10 DIAGNOSIS — M25512 Pain in left shoulder: Secondary | ICD-10-CM | POA: Diagnosis not present

## 2023-12-10 DIAGNOSIS — M6281 Muscle weakness (generalized): Secondary | ICD-10-CM

## 2023-12-10 NOTE — Therapy (Signed)
 OUTPATIENT PHYSICAL THERAPY TREATMENT   Patient Name: Cindy Carson MRN: [id number] DOB:01/07/1971, 53 y.o., female Today's Date: 12/10/2023  END OF SESSION:  PT End of Session - 12/10/23 1606     Visit Number 31    Number of Visits 37    Date for PT Re-Evaluation 12/11/23    Authorization Type U[employer]    Authorization Time Period 08/25/22 to 10/20/22    PT Start Time 1605    PT Stop Time 1643    PT Time Calculation (min) 38 min    Activity Tolerance Patient tolerated treatment well    Behavior During Therapy [facility] for tasks assessed/performed                                Past Medical History:  Diagnosis Date   Anemia 12/09/2018   Chronic low back pain    Chronic neck pain    Diabetes type 2, uncontrolled    diagnosed in 2019   Hyperlipidemia 08/01/2021   Hypertension complicating diabetes (HCC)    Neck pain 07/11/2021   Obesity 12/09/2018   OSA (obstructive sleep apnea)    sleep study done in [place] in 2019 per pt   Rash 01/28/2022   Screening for colon cancer 08/01/2021   Past Surgical History:  Procedure Laterality Date   COLONOSCOPY     COLONOSCOPY N/A 09/03/2021   Procedure: COLONOSCOPY;  Surgeon: Midge Minium, MD;  Location: [facility] ENDOSCOPY;  Service: Endoscopy;  Laterality: N/A;   SHOULDER ARTHROSCOPY WITH ROTATOR CUFF REPAIR Left 07/07/2023   Procedure: LEFT SHOULDER ARTHROSCOPY / DEBRIDEMENT WITH ROTATOR CUFF REPAIR;  Surgeon: Huel Cote, MD;  Location: [facility];  Service: Orthopedics;  Laterality: Left;   TUBAL LIGATION     Patient Active Problem List   Diagnosis Date Noted   Diabetes mellitus treated with insulin and oral medication (HCC) 11/11/2023   Nontraumatic complete tear of left rotator cuff 07/07/2023   Uncontrolled type 2 diabetes mellitus with hyperglycemia (HCC) 07/25/2022   Depression, recurrent (HCC) 07/25/2022   Encounter for screening for malignant neoplasm of breast 07/11/2021   Hyperlipidemia  associated with type 2 diabetes mellitus (HCC) 07/11/2021   Personal history of noncompliance with medical treatment, presenting hazards to health 02/15/2019   History of syphilis 12/09/2018   Bursitis of hip 12/09/2018   Anemia 12/09/2018   Obesity 12/09/2018   OSA (obstructive sleep apnea)    HTN (hypertension)    Hypertension complicating diabetes (HCC)    Chronic low back pain    Chronic neck pain      REFERRING PROVIDER:  Huel Cote, MD    REFERRING DIAG: [phone] (ICD-10-CM) - Nontraumatic complete tear of left rotator cuff  S/p Lt RCR  Rationale for Evaluation and Treatment: Rehabilitation  THERAPY DIAG:  Muscle weakness (generalized)  Acute pain of left shoulder  Stiffness of left shoulder, not elsewhere classified  ONSET DATE: DOS 07/07/23   Days since surgery: 156  SUBJECTIVE:  SUBJECTIVE STATEMENT: The patient reports her shoulder has felt good. She is still having pain in her back   Pt has been experiencing muscles spasms in low back and sciatica of new onset right before New Years. She saw PA on 1/3 and was prescribed muscle relaxers which have helped mildly. PA also sent a script for PT for this as well.  Plan to bring up concerns at next MD appt.   PERTINENT HISTORY:  DMT2, anemia, chronic neck pain  PAIN:  Are you having pain? PAIN:  Are you having pain? Yes: NPRS scale: 3/10  Pain location: Lower back  shoulder Pain description: Tightness Aggravating factors: Laying on L shoulder. Standing bothers low back Relieving factors: Heat, muscle relaxers    PRECAUTIONS:  Shoulder  RED FLAGS: None   WEIGHT BEARING RESTRICTIONS:  Yes POSTOPERATIVE PLAN: She will follow the rotator cuff repair protocol.  She will be placed on aspirin for blood clot prevention.  She  will be seen by physical therapy postop.  She will be seen at 2 weeks postop for suture removal  FALLS:  Has patient fallen in last 6 months? 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    OBJECTIVE:  Note: Objective measures were completed at Evaluation unless otherwise noted.  PATIENT SURVEYS:  FOTO 13 FOTO:45% 12/9  COGNITIVE STATUS: Within functional limits for tasks assessed   SENSATION: WFL  EDEMA:  No  POSTURE:  Eval: forward rounded shoulder as expected in sling  HAND DOMINANCE:  Right     Body Part #1 Shoulder  PALPATION: Eval: very guarded motion through shoulder  UPPER EXTREMITY ROM:  Passive ROM Right/Lt Eval 10/18 Left 12/23 Left 1/7  Shoulder flexion 20 113! 142  Shoulder extension     Shoulder abduction 0 86   Shoulder adduction     Shoulder extension     Shoulder internal rotation  75 75  Shoulder external rotation -10 48 59  Elbow flexion full    Elbow extension -20     (Blank rows = not tested)    Active  ROM Right/Lt Eval 10/18 Left 12/23 Left 1/7 Left  2/7  Shoulder flexion 20 113! 130 156   Shoulder extension      Shoulder abduction 0 86    Shoulder adduction      Shoulder extension      Shoulder internal rotation  75 Internal rotation to L5  IR to L5   Shoulder external rotation -10 48 Can reach behind her head  Full   Elbow flexion full     Elbow extension -20      (Blank rows = not tested)  MMT 4+/5 flexion and ER on left side   Lubar ROM:   Mild pain at end range extension and flexion     TREATMENT:  3/20 There-ex: PROM L shoulder STM to deltoids and bicep, UT LTR 5" x20 Supine bil ER with RTB 2x10 Standing shoulder flexion 2# 2x10 Standing  abduction 2# 2x10 Ball rolls 4 way at wall 3.3# ball x15ea Wall push ups 2x10 Theraband row GTB 2x15 Theraband extension GTB 2x15  3/14 There-ex: PROM L shoulder STM to deltoids and bicep, UT LTR 5" x20 Supine bil ER with RTB 2x10 Standing shoulder flexion 2# x10 Standing abduction 2# x10 Ball rolls 4 way at wall 2.2# ball x15ea Wall push ups 2x10 Theraband row GTB 2x15 Theraband extension GTB 2x15  3/7 There-ex: LTR: x20  PROM L shoulder Piriformis stretch LTR 5" x20 Lumbar stretch holding onto back of bike 3x15 seconds Standing shoulder flexion 2# x10 Standing abduction 2# x10 Standing OH press 2# 2x10 Theraband row GTB 2x15 Theraband extension GTB 2x15          2/28 There-ex: LTR: x20   Gluteal stretch 3x20 sec hold  Ball roll fwd and lateal 5x10 sec hold   Reviewed for HEP Updated and reviewed HEP    Nuro-Re-ed : all core exercises performed in conjunction with TA breathing  Bridging 2x10  Supine march 2x10  Shoulder extension with TA breathing 2x15  Shoulder row with TA breathing 2 x15 green  Ball press with TA breathing 2x0 with abdominal bracing       Treatment                            11/13/23: Blank lines following charge title = not provided on this treatment date.   Manual:  PROM L shoulder GHJ distraction and inf glide grade II-III  There-ex:  Piriformis stretch LTR 5" x15 Lumbar stretch holding onto back of bike 5x10 seconds Standing shoulder flexion 2# x10 Standing abduction 2# x10 Standing OH press 2# 2x10 Standing press out 2# 2x10 Wall push ups 2x10  There-Act:  Self Care:  Nuro-Re-ed: PPT 5" x15 PPT with marching in hooklying 2x10 Gait Training:     Treatment                            11/06/23: Blank lines following charge title = not provided on this treatment date.   Manual:  TPDN No  There-ex: PROM L shoulder Piriformis stretch LTR 5" x20 Lumbar stretch holding onto back of bike 3x15  seconds Standing shoulder flexion 2# x10 Standing abduction 2# x10 Standing OH press 2# 2x10 Standing press out 2# 2x10 Wall push ups 2x10  There-Act:  Self Care:  Nuro-Re-ed: PPT 5" x15 PPT with marching in hooklying 2x10 Gait Training:    2/7 There-ex: LTR: x20   Gluteal stretch 3x20 sec hold   Reviewed for HEP Updated and reviewed HEP    Nuro-Re-ed : all core exercises performed in conjunction with TA breathing  Bridging 2x10  Hip abduction 2x10  Supine ABC 1x   There act:   shoulder flexion 2x10 Shoulder scaption 2x10   Mild pain  with flexion        Last visit:  Manual: Skilled palpation of trigger points. Trigger point release to bilateral gluteals and lower lumbar spine.  Review of self soft tissue mobilization   There-ex: LTR: x20   Gluteal stretch 3x20 sec hold   Reviewed for HEP Updated and reviewed HEP    Nuro-Re-ed : all core exercises performed in conjunction with TA breathing  Bridging  2x10  Hip abduction 2x10    Trigger Point Dry Needling  Initial Treatment: Pt instructed on Dry Needling rational, procedures, and possible side effects. Pt instructed to expect mild to moderate muscle soreness later in the day and/or into the next day.  Pt instructed in methods to reduce muscle soreness. Pt instructed to continue prescribed HEP. Because Dry Needling was performed over or adjacent to a lung field, pt was educated on S/S of pneumothorax and to seek immediate medical attention should they occur.  Patient was educated on signs and symptoms of infection and other risk factors and advised to seek medical attention should they occur.  Patient verbalized understanding of these instructions and education.   Patient Verbal Consent Given: Yes Education Handout Provided: Yes Muscles Treated: bilateral upper gluteal and lower lumbar paraspinlas L5 1x in each spot .30x50 needle  Electrical Stimulation Performed: No Treatment  Response/Outcome: great twitch      PATIENT EDUCATION:  Education details: Anatomy of condition, POC, HEP, exercise form/rationale Person educated: Patient Education method: Explanation, Demonstration, Tactile cues, Verbal cues, and Handouts Education comprehension: verbalized understanding, returned demonstration, verbal cues required, tactile cues required, and needs further education  HOME EXERCISE PROGRAM: [id number]   ASSESSMENT:  CLINICAL IMPRESSION: Focused on L shoulder ROM and strengthening progressions today. Able to increase weight with ball rolls at wall as well as increased reps with 2# active flexion and abduction in standing. Pt is nearing goal completion and feels ready for d/c at next session. Will update objective measures next visit and d/c to HEP as appropriate.   REHAB POTENTIAL: Good  CLINICAL DECISION MAKING: Stable/uncomplicated  EVALUATION COMPLEXITY: Low   GOALS: Goals reviewed with patient? Yes  SHORT TERM GOALS: Target date: 4 wks (08/04/23)  Passive flexion to 140 deg without increase in pain Baseline: Goal status:achieved 12/30   2.  Passive abd to 60 deg without increase in pain Baseline:  Goal status: 60 achieved 11/11  3.  Passive ER at side to 40 deg without increase in pain Baseline:  Goal status 18 11/11   4.  Able to demo proper scapular retraction for proximal shoulder girdle support Baseline:  Goal status: progressing 12/2   5.  Patient will report a 50% reduction in intensity and duration of radicular pain  Initial  6. Patient will increase lumbar flexion and extension ROM to full  LONG TERM GOALS: Target date: POC DATE  Able to demo AROM to shoulder height, against gravity, with good scapular control Baseline:  Goal status: IN PROGRESS  Target date:09/04/23 8 weeks has not begn active motion 12/2   2.  Will begin light resistance exercises pain <=3/10 Baseline:  Goal status: Initiated today with minor pain 12/18   Target date:  8 weeks  3.  Full AROM within 10 deg of opp UE without increase in pain Baseline:  Goal status: IN PROGRESS Target date: 10/02/23 12 weeks continues to be limited in active 12/26   4.  Proper scapular control in proprioceptive and CKC strengthening Baseline:  Goal status: INITIAL Target date: 10/03/23  12 weeks  5.  Able to lift cups/plates and other small objects into overhead cabinets without limitation by shoulder Baseline:  Goal status: IN PROGRESS Target date: 10/03/23  12 weeks  6. Patient will stand without out radicular pain for 30 min without pain.  IN PROGRESS   PLAN:  PT FREQUENCY: 1-2x/week  PT DURATION: 12 weeks  PLANNED INTERVENTIONS: 97164- PT Re-evaluation, 97110-Therapeutic exercises, 97530- Therapeutic activity, 97112-  Neuromuscular re-education, [phone]- Self Care, 33295- Manual therapy, [id number]- Aquatic Therapy, 97014- Electrical stimulation (unattended), [phone]- Traction (mechanical), Patient/Family education, Taping, Dry Needling, Joint mobilization, Spinal mobilization, Scar mobilization, Cryotherapy, and Moist heat.  PLAN FOR NEXT SESSION: continue PROM per protocol; consider manual therapy to lumbar spine. Progress core strengthening exercises.    Donnel Saxon Danila Eddie, PTA 12/10/2023, 4:52 PM

## 2023-12-18 ENCOUNTER — Ambulatory Visit (HOSPITAL_BASED_OUTPATIENT_CLINIC_OR_DEPARTMENT_OTHER): Payer: 59 | Admitting: Physical Therapy

## 2023-12-18 ENCOUNTER — Encounter (HOSPITAL_BASED_OUTPATIENT_CLINIC_OR_DEPARTMENT_OTHER): Payer: Self-pay | Admitting: Physical Therapy

## 2023-12-18 DIAGNOSIS — M25612 Stiffness of left shoulder, not elsewhere classified: Secondary | ICD-10-CM

## 2023-12-18 DIAGNOSIS — G8929 Other chronic pain: Secondary | ICD-10-CM

## 2023-12-18 DIAGNOSIS — M6281 Muscle weakness (generalized): Secondary | ICD-10-CM | POA: Diagnosis not present

## 2023-12-18 DIAGNOSIS — M25512 Pain in left shoulder: Secondary | ICD-10-CM | POA: Diagnosis not present

## 2023-12-18 NOTE — Therapy (Signed)
 OUTPATIENT PHYSICAL THERAPY TREATMENT/discharge    Patient Name: Cindy Carson MRN: [id number] DOB:10-13-1970, 53 y.o., female Today's Date: 12/18/2023  END OF SESSION:  PT End of Session - 12/18/23 0847     Visit Number 32    Number of Visits 37    Authorization Type UMR    PT Start Time 0851    PT Stop Time 0929    PT Time Calculation (min) 38 min    Activity Tolerance No increased pain;Patient tolerated treatment well    Behavior During Therapy [facility] for tasks assessed/performed                                 Past Medical History:  Diagnosis Date   Anemia 12/09/2018   Chronic low back pain    Chronic neck pain    Diabetes type 2, uncontrolled    diagnosed in 2019   Hyperlipidemia 08/01/2021   Hypertension complicating diabetes (HCC)    Neck pain 07/11/2021   Obesity 12/09/2018   OSA (obstructive sleep apnea)    sleep study done in [place] in 2019 per pt   Rash 01/28/2022   Screening for colon cancer 08/01/2021   Past Surgical History:  Procedure Laterality Date   COLONOSCOPY     COLONOSCOPY N/A 09/03/2021   Procedure: COLONOSCOPY;  Surgeon: Midge Minium, MD;  Location: [facility] ENDOSCOPY;  Service: Endoscopy;  Laterality: N/A;   SHOULDER ARTHROSCOPY WITH ROTATOR CUFF REPAIR Left 07/07/2023   Procedure: LEFT SHOULDER ARTHROSCOPY / DEBRIDEMENT WITH ROTATOR CUFF REPAIR;  Surgeon: Huel Cote, MD;  Location: [facility];  Service: Orthopedics;  Laterality: Left;   TUBAL LIGATION     Patient Active Problem List   Diagnosis Date Noted   Diabetes mellitus treated with insulin and oral medication (HCC) 11/11/2023   Nontraumatic complete tear of left rotator cuff 07/07/2023   Uncontrolled type 2 diabetes mellitus with hyperglycemia (HCC) 07/25/2022   Depression, recurrent (HCC) 07/25/2022   Encounter for screening for malignant neoplasm of breast 07/11/2021   Hyperlipidemia associated with type 2 diabetes mellitus (HCC) 07/11/2021    Personal history of noncompliance with medical treatment, presenting hazards to health 02/15/2019   History of syphilis 12/09/2018   Bursitis of hip 12/09/2018   Anemia 12/09/2018   Obesity 12/09/2018   OSA (obstructive sleep apnea)    HTN (hypertension)    Hypertension complicating diabetes (HCC)    Chronic low back pain    Chronic neck pain      REFERRING PROVIDER:  Huel Cote, MD    REFERRING DIAG: [phone] (ICD-10-CM) - Nontraumatic complete tear of left rotator cuff  S/p Lt RCR  Rationale for Evaluation and Treatment: Rehabilitation  THERAPY DIAG:  Muscle weakness (generalized)  Acute pain of left shoulder  Stiffness of left shoulder, not elsewhere classified  Chronic left shoulder pain  ONSET DATE: DOS 07/07/23   Days since surgery: 164  SUBJECTIVE:  SUBJECTIVE STATEMENT: 3/28 Pt states she is doing good with her shoulder. Says the back is still off and on but overall is doing better. Says the back is more bothersome when she is standing.    PERTINENT HISTORY:  DMT2, anemia, chronic neck pain  PAIN:  Are you having pain? PAIN:  Are you having pain? Yes: NPRS scale: 3/10  Pain location: Lower back  shoulder Pain description: Tightness Aggravating factors: Laying on L shoulder. Standing bothers low back Relieving factors: Heat, muscle relaxers    PRECAUTIONS:  Shoulder  RED FLAGS: None   WEIGHT BEARING RESTRICTIONS:  Yes POSTOPERATIVE PLAN: She will follow the rotator cuff repair protocol.  She will be placed on aspirin for blood clot prevention.  She will be seen by physical therapy postop.  She will be seen at 2 weeks postop for suture removal  FALLS:  Has patient fallen in last 6 months? 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    OBJECTIVE:  Note: Objective measures were completed at Evaluation unless otherwise noted.  PATIENT SURVEYS:  FOTO 13 FOTO:45% 12/9  COGNITIVE STATUS: Within functional limits for tasks assessed   SENSATION: WFL  EDEMA:  No  POSTURE:  Eval: forward rounded shoulder as expected in sling  HAND DOMINANCE:  Right     Body Part #1 Shoulder  PALPATION: Eval: very guarded motion through shoulder  UPPER EXTREMITY ROM:  Passive ROM Right/Lt Eval 10/18 Left 12/23 Left 1/7  Shoulder flexion 20 113! 142  Shoulder extension     Shoulder abduction 0 86   Shoulder adduction     Shoulder extension     Shoulder internal rotation  75 75  Shoulder external rotation -10 48 59  Elbow flexion full    Elbow extension -20     (Blank rows = not tested)    Active  ROM Right/Lt Eval 10/18 Left 12/23 Left 1/7 Left  2/7  Shoulder flexion 20 113! 130 156   Shoulder extension      Shoulder abduction 0 86    Shoulder adduction      Shoulder extension      Shoulder internal rotation  75 Internal rotation to L5  IR to L5   Shoulder external rotation -10 48 Can reach behind her head  Full   Elbow flexion full     Elbow extension -20      (Blank rows = not tested)  MMT 4+/5 flexion and ER on left side   Lubar ROM:   Mild pain at end range extension and flexion     TREATMENT:                                                                                                                              3/28 Manual: All  PROM performed with distraction to reduce pain and improve movement STM to lumbar paraspinals L2-L5 region with trigger point therapy (tolerated well) There-ex: Nu-step lvl 3 LTR 2x10 Supine marching 2x10 Piriformis stretch 2x30sec Functional reaching 2lbsx10 Theraband row GTB 2x15 RPE 5 Theraband ext GTB 2x15 RPE 5 Cane behind  the back shoulder IR 2x10 HEP education There-Act  Self Care  Nuro-Re-ed    3/20 There-ex: PROM L shoulder STM to deltoids and bicep, UT LTR 5" x20 Supine bil ER with RTB 2x10 Standing shoulder flexion 2# 2x10 Standing abduction 2# 2x10 Ball rolls 4 way at wall 3.3# ball x15ea Wall push ups 2x10 Theraband row GTB 2x15 Theraband extension GTB 2x15  3/14 There-ex: PROM L shoulder STM to deltoids and bicep, UT LTR 5" x20 Supine bil ER with RTB 2x10 Standing shoulder flexion 2# x10 Standing abduction 2# x10 Ball rolls 4 way at wall 2.2# ball x15ea Wall push ups 2x10 Theraband row GTB 2x15 Theraband extension GTB 2x15  3/7 There-ex: LTR: x20  PROM L shoulder Piriformis stretch LTR 5" x20 Lumbar stretch holding onto back of bike 3x15 seconds Standing shoulder flexion 2# x10 Standing abduction 2# x10 Standing OH press 2# 2x10 Theraband row GTB 2x15 Theraband extension GTB 2x15          2/28 There-ex: LTR: x20   Gluteal stretch 3x20 sec hold  Ball roll fwd and lateal 5x10 sec hold   Reviewed for HEP Updated and reviewed HEP    Nuro-Re-ed : all core exercises performed in conjunction with TA breathing  Bridging 2x10  Supine march 2x10  Shoulder extension with TA breathing 2x15  Shoulder row with TA breathing 2 x15 green  Ball press with TA breathing 2x0 with abdominal bracing       Treatment                            11/13/23: Blank lines following charge title = not provided on this treatment date.   Manual:  PROM L shoulder GHJ distraction and inf glide grade II-III  There-ex:  Piriformis stretch LTR 5" x15 Lumbar stretch holding onto back of bike 5x10 seconds Standing shoulder flexion 2# x10 Standing abduction 2# x10 Standing OH press 2# 2x10 Standing press out 2# 2x10 Wall push ups 2x10  There-Act:  Self Care:  Nuro-Re-ed: PPT 5" x15 PPT with marching in hooklying 2x10 Gait Training:     Treatment                             11/06/23: Blank lines following charge title = not provided on this treatment date.   Manual:  TPDN No  There-ex: PROM L shoulder Piriformis stretch LTR 5" x20 Lumbar stretch holding onto back of bike 3x15 seconds Standing shoulder flexion 2# x10 Standing abduction 2# x10 Standing OH press 2# 2x10 Standing press out 2# 2x10 Wall push ups 2x10  There-Act:  Self Care:  Nuro-Re-ed: PPT 5" x15 PPT with marching in hooklying 2x10 Gait Training:    2/7 There-ex: LTR: x20   Gluteal stretch 3x20 sec hold   Reviewed for HEP Updated and reviewed HEP    Nuro-Re-ed : all core exercises performed in conjunction with TA breathing  Bridging 2x10  Hip abduction 2x10  Supine ABC 1x   There act:   shoulder flexion 2x10 Shoulder scaption 2x10   Mild pain  with flexion  Last visit:  Manual: Skilled palpation of trigger points. Trigger point release to bilateral gluteals and lower lumbar spine.  Review of self soft tissue mobilization   There-ex: LTR: x20   Gluteal stretch 3x20 sec hold   Reviewed for HEP Updated and reviewed HEP    Nuro-Re-ed : all core exercises performed in conjunction with TA breathing  Bridging 2x10  Hip abduction 2x10    Trigger Point Dry Needling  Initial Treatment: Pt instructed on Dry Needling rational, procedures, and possible side effects. Pt instructed to expect mild to moderate muscle soreness later in the day and/or into the next day.  Pt instructed in methods to reduce muscle soreness. Pt instructed to continue prescribed HEP. Because Dry Needling was performed over or adjacent to a lung field, pt was educated on S/S of pneumothorax and to seek immediate medical attention should they occur.  Patient was educated on signs and symptoms of infection and other risk factors and advised to seek medical attention should they occur.  Patient verbalized understanding of these instructions and education.   Patient  Verbal Consent Given: Yes Education Handout Provided: Yes Muscles Treated: bilateral upper gluteal and lower lumbar paraspinlas L5 1x in each spot .30x50 needle  Electrical Stimulation Performed: No Treatment Response/Outcome: great twitch      PATIENT EDUCATION:  Education details: Teacher, music of condition, POC, HEP, exercise form/rationale Person educated: Patient Education method: Explanation, Demonstration, Tactile cues, Verbal cues, and Handouts Education comprehension: verbalized understanding, returned demonstration, verbal cues required, tactile cues required, and needs further education  HOME EXERCISE PROGRAM: [email or web address]   ASSESSMENT:  CLINICAL IMPRESSION: 3/28 Pt came in and warmed up on the nu-step for which was tolerated well. Proceeded to apply STM to the lumbar spine and paraspinals noting tightness in the paraspinals that was mildly symptomatic. Applied CPA's and UPA's to the region noting decreased sensitivity after. Moved through HEP for discharge with physical and verbal cues for posture and muscle activation. Pt tolerated all exercises well and showed proper understanding of progression, exercise grading, and ADL comfort. Pt met all her goals and will continue to do HEP.   Focused on L shoulder ROM and strengthening progressions today. Able to increase weight with ball rolls at wall as well as increased reps with 2# active flexion and abduction in standing. Pt is nearing goal completion and feels ready for d/c at next session. Will update objective measures next visit and d/c to HEP as appropriate.   REHAB POTENTIAL: Good  CLINICAL DECISION MAKING: Stable/uncomplicated  EVALUATION COMPLEXITY: Low   GOALS: Goals reviewed with patient? Yes  SHORT TERM GOALS: Target date: 4 wks (08/04/23)  Passive flexion to 140 deg without increase in pain Baseline: Goal status:achieved 12/30   2.  Passive abd to 60 deg without increase in pain Baseline:  Goal status: 60  achieved 11/11  3.  Passive ER at side to 40 deg without increase in pain Baseline:  Goal status 18 11/11   4.  Able to demo proper scapular retraction for proximal shoulder girdle support Baseline:  Goal status: progressing 12/2   5.  Patient will report a 50% reduction in intensity and duration of radicular pain  Initial  6. Patient will increase lumbar flexion and extension ROM to full  LONG TERM GOALS: Target date: POC DATE  Able to demo AROM to shoulder height, against gravity, with good scapular control Baseline:  Goal status: Pt met goal Target date:09/04/23 8 weeks has not begn active motion 12/2  2.  Will begin light resistance exercises pain <=3/10 Baseline:  Goal status: Pt met goal Target date:  8 weeks  3.  Full AROM within 10 deg of opp UE without increase in pain Baseline:  Goal status: Pt met goal Target date: 10/02/23 12 weeks continues to be limited in active 12/26   4.  Proper scapular control in proprioceptive and CKC strengthening Baseline:  Goal status: Pt met goal Target date: 10/03/23  12 weeks  5.  Able to lift cups/plates and other small objects into overhead cabinets without limitation by shoulder Baseline:  Goal status:Pt met goal Target date: 10/03/23  12 weeks  6. Patient will stand without out radicular pain for 30 min without pain.  Goal status: Pt met goal   PLAN:  PT FREQUENCY: 1-2x/week  PT DURATION: 12 weeks  PLANNED INTERVENTIONS: 97164- PT Re-evaluation, 97110-Therapeutic exercises, 97530- Therapeutic activity, [id number]- Neuromuscular re-education, 97535- Self Care, [id number]- Manual therapy, [id number]- Aquatic Therapy, 97014- Electrical stimulation (unattended), [phone]- Traction (mechanical), Patient/Family education, Taping, Dry Needling, Joint mobilization, Spinal mobilization, Scar mobilization, Cryotherapy, and Moist heat.  PLAN FOR NEXT SESSION: Pt is discharged. Lorayne Bender PT DPT  Landry Corporal, Student-PT 12/18/2023, 1:26  PM  I have reviewed and concur with this student's documentation.   Dessie Coma, PT 12/20/2023 3:52 PM   During this treatment session, the therapist was present, participating in and directing the treatment.

## 2023-12-28 ENCOUNTER — Ambulatory Visit (INDEPENDENT_AMBULATORY_CARE_PROVIDER_SITE_OTHER): Payer: 59 | Admitting: Nurse Practitioner

## 2023-12-28 ENCOUNTER — Ambulatory Visit: Payer: 59 | Admitting: Nurse Practitioner

## 2023-12-28 ENCOUNTER — Encounter: Payer: Self-pay | Admitting: Nurse Practitioner

## 2023-12-28 VITALS — BP 122/78 | HR 100 | Ht 62.5 in | Wt 192.0 lb

## 2023-12-28 DIAGNOSIS — Z01419 Encounter for gynecological examination (general) (routine) without abnormal findings: Secondary | ICD-10-CM

## 2023-12-28 DIAGNOSIS — N898 Other specified noninflammatory disorders of vagina: Secondary | ICD-10-CM

## 2023-12-28 DIAGNOSIS — N951 Menopausal and female climacteric states: Secondary | ICD-10-CM | POA: Diagnosis not present

## 2023-12-28 DIAGNOSIS — R4189 Other symptoms and signs involving cognitive functions and awareness: Secondary | ICD-10-CM | POA: Diagnosis not present

## 2023-12-28 DIAGNOSIS — Z780 Asymptomatic menopausal state: Secondary | ICD-10-CM

## 2023-12-28 NOTE — Progress Notes (Signed)
 Cindy Carson 05-31-1971 [id number]   History:  53 y.o. [id number] presents for annual exam. Postmenopausal - No HRT. LMP around 2 years ago per patient. Complains of hot flashes, mood swings, brain fog, trouble sleeping and vaginal dryness. Reports abnormal pap history with colpo ~7 years, neg biopsies. H/O DM, HTN, HLD, syphilis.   Gynecologic History No LMP recorded. Patient is postmenopausal.   Contraception/Family planning: post menopausal status and tubal ligation Sexually active: Yes  Health Maintenance Last Pap: 10/09/2022. Results were: Normal neg HPV Last mammogram: 09/16/2022. Results were: Normal Last colonoscopy: 09/03/2021. Results were: Normal, 10-year recall Last Dexa: Not indicated  Past medical history, past surgical history, family history and social history were all reviewed and documented in the EPIC chart. 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  ROS:  A ROS was performed and pertinent positives and negatives are included.  Exam:  Vitals:   12/28/23 0857  BP: 122/78  Pulse: 100  SpO2: 97%  Weight: 192 lb (87.1 kg)  Height: 5' 2.5" (1.588 m)    Body mass index is 34.56 kg/m.  General appearance:  Normal Thyroid:  Symmetrical, normal in size, without palpable masses or nodularity. Respiratory  Auscultation:  Clear without wheezing or rhonchi Cardiovascular  Auscultation:  Regular rate, without rubs, murmurs or gallops  Edema/varicosities:  Not grossly evident Abdominal  Soft,nontender, without masses, guarding or rebound.  Liver/spleen:  No organomegaly noted  Hernia:  None appreciated  Skin  Inspection:  Grossly normal Breasts: Examined lying and sitting.   Right: Without masses, retractions, nipple discharge or axillary adenopathy.   Left: Without masses, retractions, nipple discharge or axillary adenopathy. Pelvic: External genitalia:  no lesions              Urethra:  normal appearing urethra with no  masses, tenderness or lesions              Bartholins and Skenes: normal                 Vagina: normal appearing vagina with normal color and discharge, no lesions              Cervix: no lesions Bimanual Exam:  Uterus:  no masses or tenderness              Adnexa: no mass, fullness, tenderness              Rectovaginal: Deferred              Anus:  normal, no lesions  Patient informed chaperone available to be present for breast and pelvic exam. Patient has requested no chaperone to be present. Patient has been advised what will be completed during breast and pelvic exam.   Assessment/Plan:  53 y.o. [id number] for annual exam.   Well female exam with routine gynecological exam - Education provided on SBEs, importance of preventative screenings, current guidelines, high calcium diet, regular exercise, and multivitamin daily. Labs with PCP.   Postmenopausal - no HRT, no bleeding  Menopausal symptoms - Complains of hot flashes, mood swings, brain fog, trouble sleeping and vaginal dryness. Discussed HRT. Wants to try lubricant and Mag L-threonate first.   Vaginal dryness - does not use lubricant. [employer]ube samples provided. Recommend oil or silicone based. Vaginal estrogen an option.   Brain fog - her most bothersome symptom. Recommend Mag L-threonate daily.   Screening for cervical cancer - Reports abnormal pap ~7 years ago, neg biopsies. Normal  paps since. Will repeat at 5-year interval per guidelines.   Screening for breast cancer - Normal mammogram history. Overdue due to should surgery. Plans to schedule soon. Normal breast exam today.  Screening for colon cancer - 08/2021 colonoscopy. Will repeat at 10-year interval per GI's recommendation.   Screening for osteoporosis - Average risk. Will plan DXA at age 89.   Return in about 1 year (around 12/27/2024) for Annual.     Cindy Mackie DNP, 9:26 AM 12/28/2023

## 2023-12-30 ENCOUNTER — Ambulatory Visit (HOSPITAL_BASED_OUTPATIENT_CLINIC_OR_DEPARTMENT_OTHER): Payer: 59 | Admitting: Orthopaedic Surgery

## 2023-12-30 DIAGNOSIS — M75122 Complete rotator cuff tear or rupture of left shoulder, not specified as traumatic: Secondary | ICD-10-CM | POA: Diagnosis not present

## 2023-12-30 NOTE — Progress Notes (Signed)
 Post Operative Evaluation    Procedure/Date of Surgery: Left shoulder rotator cuff repair with collagen patch augmentation 10/15  Interval History:   6 months status post left shoulder rotator cuff repair doing extremely well.  Overhead motion is much better at today's visit.  She does occasionally experience some soreness although this is mild.  She is back to work full-time  PMH/PSH/Family History/Social History/Meds/Allergies:    Past Medical History:  Diagnosis Date   Anemia 12/09/2018   Chronic low back pain    Chronic neck pain    Diabetes type 2, uncontrolled    diagnosed in 2019   Hyperlipidemia 08/01/2021   Hypertension complicating diabetes (HCC)    Neck pain 07/11/2021   Obesity 12/09/2018   OSA (obstructive sleep apnea)    sleep study done in [place] in 2019 per pt   Rash 01/28/2022   Screening for colon cancer 08/01/2021   Past Surgical History:  Procedure Laterality Date   COLONOSCOPY     COLONOSCOPY N/A 09/03/2021   Procedure: COLONOSCOPY;  Surgeon: Midge Minium, MD;  Location: [facility] ENDOSCOPY;  Service: Endoscopy;  Laterality: N/A;   SHOULDER ARTHROSCOPY WITH ROTATOR CUFF REPAIR Left 07/07/2023   Procedure: LEFT SHOULDER ARTHROSCOPY / DEBRIDEMENT WITH ROTATOR CUFF REPAIR;  Surgeon: Huel Cote, MD;  Location: [facility];  Service: Orthopedics;  Laterality: Left;   TUBAL LIGATION     Social History   Socioeconomic History   Marital status: Legally Separated    Spouse name: Not on file   Number of children: Not on file   Years of education: Not on file   Highest education level: Associate degree: academic program  Occupational History   Not on file  Tobacco Use   Smoking status: Never   Smokeless tobacco: Never  Vaping Use   Vaping status: Never Used  Substance and Sexual Activity   Alcohol use: Never   Drug use: Never   Sexual activity: Not Currently    Partners: Male    Birth  control/protection: Surgical, Post-menopausal    Comment: BTL, First IC <16 y/o, >5 Partners, Hx of RPR, GC  Other Topics Concern   Not on file  Social History Narrative   Not on file   Social Drivers of Health   Financial Resource Strain: Low Risk  (08/11/2023)   Overall Financial Resource Strain (CARDIA)    Difficulty of Paying Living Expenses: Not very hard  Food Insecurity: Food Insecurity Present (08/11/2023)   Hunger Vital Sign    Worried About Running Out of Food in the Last Year: Sometimes true    Ran Out of Food in the Last Year: Sometimes true  Transportation Needs: No Transportation Needs (08/11/2023)   PRAPARE - Administrator, Civil Service (Medical): No    Lack of Transportation (Non-Medical): No  Physical Activity: Insufficiently Active (08/11/2023)   Exercise Vital Sign    Days of Exercise per Week: 2 days    Minutes of Exercise per Session: 10 min  Stress: No Stress Concern Present (08/11/2023)   [employer] of Occupational Health - Occupational Stress Questionnaire    Feeling of Stress : Only a little  Social Connections: Socially Isolated (08/11/2023)   Social Connection and Isolation Panel [NHANES]    Frequency of Communication with Friends and Family: More than three times  a week    Frequency of Social Gatherings with Friends and Family: Once a week    Attends Religious Services: Never    Database administrator or Organizations: No    Attends Engineer, structural: Not on file    Marital Status: Separated   Family History  Problem Relation Age of Onset   Varicose Veins Father    Hypertension Sister    Diabetes Sister    Diabetes Sister    Heart murmur Brother    Cancer Maternal Grandmother    Allergies  Allergen Reactions   Nsaids Nausea Only    nausea   Tape Rash    Surgical tape    Current Outpatient Medications  Medication Sig Dispense Refill   amLODipine (NORVASC) 5 MG tablet Take 1 tablet by mouth once daily 90  tablet 1   atorvastatin (LIPITOR) 20 MG tablet Take 1 tablet by mouth once daily 90 tablet 1   Blood Glucose Monitoring Suppl (BLOOD GLUCOSE MONITOR SYSTEM) w/Device KIT 1 each by Does not apply route in the morning, at noon, and at bedtime. 1 kit 0   DULoxetine (CYMBALTA) 30 MG capsule Take 1 capsule (30 mg total) by mouth daily. 90 capsule 1   glipiZIDE (GLUCOTROL) 5 MG tablet Take 1 tablet (5 mg total) by mouth 2 (two) times daily before a meal. 180 tablet 1   Glucose Blood (BLOOD GLUCOSE TEST STRIPS) STRP For monitoring blood sugar levels up to 4 times a day. May substitute to any manufacturer covered by patient's insurance. 200 strip 99   insulin glargine-yfgn (SEMGLEE, YFGN,) 100 UNIT/ML Pen Inject 30 Units into the skin at bedtime. Inject 10 Units into the skin at bedtime. Take 10 UNITS nightly, may go up by 3 UNITS every 3 days if morning sugars are greater than 130 in increments of (13 UNITS, 16 UNITS, 19 UNITS, 22 UNITS, 25 UNITS every 3 days based on morning sugars) do not go higher than 25 UNITS.     Insulin Pen Needle (UNIFINE PENTIPS) 31G X 6 MM MISC Use daily with insulin 100 each 1   Lancet Device MISC For monitoring blood sugar up to 4 times a day. May substitute to any manufacturer covered by patient's insurance. 200 each 99   Lancets (FREESTYLE) lancets Test 4x daily 200 each 11   Semaglutide, 2 MG/DOSE, 8 MG/3ML SOPN Inject 2 mg as directed once a week. 6 mL 1   No current facility-administered medications for this visit.   No results found.  Review of Systems:   A ROS was performed including pertinent positives and negatives as documented in the HPI.   Musculoskeletal Exam:     Left shoulder is well-appearing incisions are without erythema or drainage.  Active forward elevation is to 80 degrees in the standing position..  External rotation at the side is to 40 degrees.  Internal rotation deferred today.  Distal neurosensory exam is intact  Imaging:      I personally  reviewed and interpreted the radiographs.   Assessment:   6 months status post left shoulder rotator cuff repair overall doing well.  Active range of motion and strengthening is coming quite nicely.  Strength is also improved at today's visit.  I did counsel that her soreness will continue to improve over the course of the next several months.  I will plan to see her back as needed  Plan :    -Return to clinic as needed  I personally saw and evaluated the patient, and participated in the management and treatment plan.  Huel Cote, MD Attending Physician, Orthopedic Surgery  This document was dictated using Dragon voice recognition software. A reasonable attempt at proof reading has been made to minimize errors.

## 2024-01-06 ENCOUNTER — Encounter (HOSPITAL_BASED_OUTPATIENT_CLINIC_OR_DEPARTMENT_OTHER): Payer: 59 | Admitting: Orthopaedic Surgery

## 2024-01-06 DIAGNOSIS — E119 Type 2 diabetes mellitus without complications: Secondary | ICD-10-CM | POA: Diagnosis not present

## 2024-01-06 LAB — HM DIABETES EYE EXAM

## 2024-02-09 ENCOUNTER — Other Ambulatory Visit (HOSPITAL_BASED_OUTPATIENT_CLINIC_OR_DEPARTMENT_OTHER): Payer: Self-pay

## 2024-02-09 ENCOUNTER — Ambulatory Visit (INDEPENDENT_AMBULATORY_CARE_PROVIDER_SITE_OTHER): Payer: 59 | Admitting: Nurse Practitioner

## 2024-02-09 ENCOUNTER — Encounter: Payer: Self-pay | Admitting: Nurse Practitioner

## 2024-02-09 VITALS — BP 113/77 | HR 85 | Temp 98.6°F | Resp 16 | Ht 62.52 in | Wt 192.8 lb

## 2024-02-09 DIAGNOSIS — E119 Type 2 diabetes mellitus without complications: Secondary | ICD-10-CM

## 2024-02-09 DIAGNOSIS — Z794 Long term (current) use of insulin: Secondary | ICD-10-CM | POA: Diagnosis not present

## 2024-02-09 DIAGNOSIS — Z1330 Encounter for screening examination for mental health and behavioral disorders, unspecified: Secondary | ICD-10-CM | POA: Diagnosis not present

## 2024-02-09 DIAGNOSIS — R0683 Snoring: Secondary | ICD-10-CM | POA: Diagnosis not present

## 2024-02-09 DIAGNOSIS — Z7984 Long term (current) use of oral hypoglycemic drugs: Secondary | ICD-10-CM | POA: Diagnosis not present

## 2024-02-09 DIAGNOSIS — I10 Essential (primary) hypertension: Secondary | ICD-10-CM

## 2024-02-09 DIAGNOSIS — E1169 Type 2 diabetes mellitus with other specified complication: Secondary | ICD-10-CM | POA: Diagnosis not present

## 2024-02-09 DIAGNOSIS — E785 Hyperlipidemia, unspecified: Secondary | ICD-10-CM

## 2024-02-09 DIAGNOSIS — F339 Major depressive disorder, recurrent, unspecified: Secondary | ICD-10-CM | POA: Diagnosis not present

## 2024-02-09 DIAGNOSIS — Z1159 Encounter for screening for other viral diseases: Secondary | ICD-10-CM

## 2024-02-09 LAB — MICROALBUMIN, URINE WAIVED
Creatinine, Urine Waived: 100 mg/dL (ref 10–300)
Microalb, Ur Waived: 30 mg/L — ABNORMAL HIGH (ref 0–19)
Microalb/Creat Ratio: <30 mg/g (ref <30)

## 2024-02-09 MED ORDER — SEMAGLUTIDE (1 MG/DOSE) 4 MG/3ML ~~LOC~~ SOPN
1.0000 mg | PEN_INJECTOR | SUBCUTANEOUS | 1 refills | Status: DC
Start: 2024-02-09 — End: 2024-05-11
  Filled 2024-02-09: qty 6, 56d supply, fill #0

## 2024-02-09 MED ORDER — GLIPIZIDE 10 MG PO TABS
10.0000 mg | ORAL_TABLET | Freq: Two times a day (BID) | ORAL | 1 refills | Status: AC
  Filled 2024-02-09: qty 180, 90d supply, fill #0
  Filled 2024-06-18: qty 180, 90d supply, fill #1

## 2024-02-09 NOTE — Assessment & Plan Note (Signed)
Chronic.  Controlled.  Continue with current medication regimen of Atorvastatin daily.  Refills sent today.  Labs ordered today.  Return to clinic in 6 months for reevaluation.  Call sooner if concerns arise.    

## 2024-02-09 NOTE — Assessment & Plan Note (Signed)
 Chronic. Not well controlled.  Has not been taking her Ozempic  2mg  due to side effects.  Will restart 1mg  dose.  Increased Glipizide  to 10mg  BID.  Continue with Semglee  30u nightly.  Labs ordered today.  Follow up in 3 months.  Call sooner if concerns arise.

## 2024-02-09 NOTE — Progress Notes (Signed)
 BP 113/77 (BP Location: Left Arm, Patient Position: Sitting, Cuff Size: Large)   Pulse 85   Temp 98.6 F (37 C) (Oral)   Resp 16   Ht 5' 2.52" (1.588 m)   Wt 192 lb 12.8 oz (87.5 kg)   LMP 03/09/2019   SpO2 98%   BMI 34.68 kg/m    Subjective:    Patient ID: Cindy Carson, female    DOB: July 15, 1971, 53 y.o.   MRN: [id number]  HPI: Cindy Carson is a 53 y.o. female  Chief Complaint  Patient presents with   Hypertension    No home checks, no recent issues   Diabetes    Occasional checks at home. Fingers are feeling numb for about a week to 2 weeks. Unable to take current dose of Ozempic  for about 2 months now due to GI upset.    Sleep Apnea    No longer uses and is unsure about home study. Was completed in [facility] where she also got the machine. Doesn't use as it broke.     HYPERTENSION without Chronic Kidney Disease Hypertension status: controlled  Satisfied with current treatment? yes Duration of hypertension: years BP monitoring frequency:  not checking BP range:  BP medication side effects:  no Medication compliance: excellent compliance Previous BP meds:amlodipine  Aspirin : no Recurrent headaches: no Visual changes: no Palpitations: no Dyspnea: no Chest pain: no Lower extremity edema: no Dizzy/lightheaded: no  DIABETES Patient states stopped taking the Ozempic  2mg .  She has been off of the medication for about 2 months.  She was tolerating the 1mg  okay.  Still using Semgless 30u, Glipizide  5mg  BID.  Hypoglycemic episodes:no Polydipsia/polyuria: no Visual disturbance: no Chest pain: no Paresthesias: no Glucose Monitoring: no  Accucheck frequency: Not Checking  Fasting glucose:  Post prandial:  Evening:  Before meals: Taking Insulin ?: no  Long acting insulin :  Short acting insulin : Blood Pressure Monitoring: not checking Retinal Examination: Not up to Date Foot Exam: up to date Diabetic Education: Not Completed Pneumovax: Up to Date Influenza: Up to  Date Aspirin : no  DEPRESSION Patient states she is doing okay.  Her depression is on and off.  Denies concerns at visit today. Continues with the Duloxetine  30mg .  She did not tolerate increasing the dose of Duloxetine  was too strong.     Satisfied with current treatment?: no Symptom severity: severe  Duration of current treatment : chronic Side effects: no Medication compliance: good compliance Psychotherapy/counseling: no in the past Previous psychiatric medications: cymbalta  unsure if she has been on other medications in the past  Depressed mood: yes Anxious mood: yes Anhedonia: yes Significant weight loss or gain: no Insomnia: yes hard to stay asleep Fatigue: yes Feelings of worthlessness or guilt: yes Impaired concentration/indecisiveness: yes reports concerns for memory troubles  Suicidal ideations: no Hopelessness: yes  SLEEP APNEA Sleep apnea status: uncontrolled Duration: chronic Satisfied with current treatment?:  no CPAP use:  no Sleep quality with CPAP use: good", average Treament compliance:poor compliance Last sleep study: over a year ago Treatments attempted: CPAP- machine is broke Wakes feeling refreshed:  no Daytime hypersomnolence:  yes Fatigue:  yes Insomnia:  yes Good sleep hygiene:  no Difficulty falling asleep:  no Difficulty staying asleep:  yes Snoring bothers bed partner:  yes Observed apnea by bed partner: no Obesity:  yes Hypertension: yes  Pulmonary hypertension:  no Coronary artery disease:  no   Patient states she has been having some numbness in her right hand.  States the left hand started  last night. She has had carpal tunnel in the past.      Relevant past medical, surgical, family and social history reviewed and updated as indicated. Interim medical history since our last visit reviewed. Allergies and medications reviewed and updated.  Review of Systems  Eyes:  Negative for visual disturbance.  Respiratory:  Positive for  apnea. Negative for cough, chest tightness and shortness of breath.        Snoring  Cardiovascular:  Negative for chest pain, palpitations and leg swelling.  Gastrointestinal:  Positive for constipation.  Endocrine: Negative for polydipsia and polyuria.  Neurological:  Positive for numbness. Negative for dizziness and headaches.  Psychiatric/Behavioral:  Positive for dysphoric mood. Negative for suicidal ideas.     Per HPI unless specifically indicated above     Objective:    BP 113/77 (BP Location: Left Arm, Patient Position: Sitting, Cuff Size: Large)   Pulse 85   Temp 98.6 F (37 C) (Oral)   Resp 16   Ht 5' 2.52" (1.588 m)   Wt 192 lb 12.8 oz (87.5 kg)   LMP 03/09/2019   SpO2 98%   BMI 34.68 kg/m   Wt Readings from Last 3 Encounters:  02/09/24 192 lb 12.8 oz (87.5 kg)  12/28/23 192 lb (87.1 kg)  09/10/23 180 lb (81.6 kg)    Physical Exam Vitals and nursing note reviewed.  Constitutional:      General: She is not in acute distress.    Appearance: Normal appearance. She is normal weight. She is not ill-appearing, toxic-appearing or diaphoretic.  HENT:     Head: Normocephalic.     Right Ear: External ear normal.     Left Ear: External ear normal.     Nose: Nose normal.     Mouth/Throat:     Mouth: Mucous membranes are moist.     Pharynx: Oropharynx is clear.  Eyes:     General:        Right eye: No discharge.        Left eye: No discharge.     Extraocular Movements: Extraocular movements intact.     Conjunctiva/sclera: Conjunctivae normal.     Pupils: Pupils are equal, round, and reactive to light.  Cardiovascular:     Rate and Rhythm: Normal rate and regular rhythm.     Heart sounds: No murmur heard. Pulmonary:     Effort: Pulmonary effort is normal. No respiratory distress.     Breath sounds: Normal breath sounds. No wheezing or rales.  Musculoskeletal:     Cervical back: Normal range of motion and neck supple.  Skin:    General: Skin is warm and dry.      Capillary Refill: Capillary refill takes less than 2 seconds.  Neurological:     General: No focal deficit present.     Mental Status: She is alert and oriented to person, place, and time. Mental status is at baseline.  Psychiatric:        Mood and Affect: Mood normal.        Behavior: Behavior normal.        Thought Content: Thought content normal.        Judgment: Judgment normal.     Results for orders placed or performed in visit on 01/12/24  HM DIABETES EYE EXAM   Collection Time: 01/06/24  1:54 PM  Result Value Ref Range   HM Diabetic Eye Exam No Retinopathy No Retinopathy      Assessment & Plan:   Problem  List Items Addressed This Visit       Cardiovascular and Mediastinum   Hypertension complicating diabetes (HCC) - Primary   Chronic.  Controlled.  Continue with current medication regimen of amlodipine .  Labs ordered today.  Return to clinic in 6 months for reevaluation.  Call sooner if concerns arise.        Relevant Medications   Semaglutide , 1 MG/DOSE, 4 MG/3ML SOPN   glipiZIDE  (GLUCOTROL ) 10 MG tablet   Other Relevant Orders   Comprehensive metabolic panel with GFR     Endocrine   Hyperlipidemia associated with type 2 diabetes mellitus (HCC)   Chronic.  Controlled.  Continue with current medication regimen of Atorvastatin  daily.  Refills sent today.  Labs ordered today.  Return to clinic in 6 months for reevaluation.  Call sooner if concerns arise.       Relevant Medications   Semaglutide , 1 MG/DOSE, 4 MG/3ML SOPN   glipiZIDE  (GLUCOTROL ) 10 MG tablet   Other Relevant Orders   Lipid panel   Diabetes mellitus treated with insulin  and oral medication (HCC)   Chronic. Not well controlled.  Has not been taking her Ozempic  2mg  due to side effects.  Will restart 1mg  dose.  Increased Glipizide  to 10mg  BID.  Continue with Semglee  30u nightly.  Labs ordered today.  Follow up in 3 months.  Call sooner if concerns arise.       Relevant Medications   Semaglutide , 1  MG/DOSE, 4 MG/3ML SOPN   glipiZIDE  (GLUCOTROL ) 10 MG tablet   Other Relevant Orders   Hemoglobin A1c   Microalbumin, Urine Waived     Other   Depression, recurrent (HCC)   Chronic.  Controlled.  Continue with current medication regimen of Duloxetine  30mg .  Does not want to adjust the dose.  Refilled at visit today.  Labs ordered today.  Return to clinic in 6 months for reevaluation.  Call sooner if concerns arise.       Other Visit Diagnoses       Snoring       Relevant Orders   Home sleep test     Encounter for hepatitis C screening test for low risk patient       Relevant Orders   Hepatitis C Antibody     Encounter for behavioral health screening            Follow up plan: Return in about 3 months (around 05/11/2024) for HTN, HLD, DM2 FU.

## 2024-02-09 NOTE — Assessment & Plan Note (Signed)
 Chronic.  Controlled.  Continue with current medication regimen of Duloxetine  30mg .  Does not want to adjust the dose.  Refilled at visit today.  Labs ordered today.  Return to clinic in 6 months for reevaluation.  Call sooner if concerns arise.

## 2024-02-09 NOTE — Assessment & Plan Note (Signed)
 Chronic.  Controlled.  Continue with current medication regimen of amlodipine.  Labs ordered today.  Return to clinic in 6 months for reevaluation.  Call sooner if concerns arise.

## 2024-02-10 ENCOUNTER — Ambulatory Visit: Payer: Self-pay | Admitting: Nurse Practitioner

## 2024-02-10 ENCOUNTER — Ambulatory Visit: Payer: 59 | Admitting: Nurse Practitioner

## 2024-02-10 DIAGNOSIS — G5603 Carpal tunnel syndrome, bilateral upper limbs: Secondary | ICD-10-CM

## 2024-02-10 DIAGNOSIS — G4733 Obstructive sleep apnea (adult) (pediatric): Secondary | ICD-10-CM

## 2024-02-10 LAB — LIPID PANEL
Chol/HDL Ratio: 3.9 ratio (ref 0.0–4.4)
Cholesterol, Total: 155 mg/dL (ref 100–199)
HDL: 40 mg/dL (ref >39)
LDL Chol Calc (NIH): 81 mg/dL (ref 0–99)
Triglycerides: 204 mg/dL — ABNORMAL HIGH (ref 0–149)
VLDL Cholesterol Cal: 34 mg/dL (ref 5–40)

## 2024-02-10 LAB — HEPATITIS C ANTIBODY: Hep C Virus Ab: NONREACTIVE

## 2024-02-10 LAB — COMPREHENSIVE METABOLIC PANEL WITH GFR
ALT: 26 IU/L (ref 0–32)
AST: 17 IU/L (ref 0–40)
Albumin: 4.3 g/dL (ref 3.8–4.9)
Alkaline Phosphatase: 127 IU/L — ABNORMAL HIGH (ref 44–121)
BUN/Creatinine Ratio: 15 (ref 9–23)
BUN: 12 mg/dL (ref 6–24)
Bilirubin Total: 0.4 mg/dL (ref 0.0–1.2)
CO2: 23 mmol/L (ref 20–29)
Calcium: 9.6 mg/dL (ref 8.7–10.2)
Chloride: 101 mmol/L (ref 96–106)
Creatinine, Ser: 0.82 mg/dL (ref 0.57–1.00)
Globulin, Total: 2.8 g/dL (ref 1.5–4.5)
Glucose: 344 mg/dL — ABNORMAL HIGH (ref 70–99)
Potassium: 4.6 mmol/L (ref 3.5–5.2)
Sodium: 138 mmol/L (ref 134–144)
Total Protein: 7.1 g/dL (ref 6.0–8.5)
eGFR: 85 mL/min/{1.73_m2} (ref >59)

## 2024-02-10 LAB — HEMOGLOBIN A1C
Est. average glucose Bld gHb Est-mCnc: 332 mg/dL
Hgb A1c MFr Bld: 13.2 % — ABNORMAL HIGH (ref 4.8–5.6)

## 2024-02-23 ENCOUNTER — Other Ambulatory Visit: Payer: Self-pay

## 2024-03-01 ENCOUNTER — Ambulatory Visit (HOSPITAL_BASED_OUTPATIENT_CLINIC_OR_DEPARTMENT_OTHER)

## 2024-03-01 ENCOUNTER — Encounter (HOSPITAL_BASED_OUTPATIENT_CLINIC_OR_DEPARTMENT_OTHER): Payer: Self-pay | Admitting: Student

## 2024-03-01 ENCOUNTER — Other Ambulatory Visit (HOSPITAL_BASED_OUTPATIENT_CLINIC_OR_DEPARTMENT_OTHER): Payer: Self-pay

## 2024-03-01 ENCOUNTER — Ambulatory Visit (INDEPENDENT_AMBULATORY_CARE_PROVIDER_SITE_OTHER): Admitting: Student

## 2024-03-01 DIAGNOSIS — G5601 Carpal tunnel syndrome, right upper limb: Secondary | ICD-10-CM

## 2024-03-01 DIAGNOSIS — M79641 Pain in right hand: Secondary | ICD-10-CM | POA: Diagnosis not present

## 2024-03-01 DIAGNOSIS — M1811 Unilateral primary osteoarthritis of first carpometacarpal joint, right hand: Secondary | ICD-10-CM | POA: Diagnosis not present

## 2024-03-01 MED ORDER — MELOXICAM 15 MG PO TABS
15.0000 mg | ORAL_TABLET | Freq: Every day | ORAL | 0 refills | Status: AC
  Filled 2024-03-01: qty 14, 14d supply, fill #0

## 2024-03-01 NOTE — Progress Notes (Signed)
 Chief Complaint: Right hand numbness     History of Present Illness:    Cindy Carson is a 53 y.o. female presenting to clinic for evaluation of tingling in the right hand.  She reports that this has been persistent over the past 2 weeks.  Numbness and tingling is most noticeable in the middle finger.  She does have history of carpal tunnel syndrome in the left hand for which she underwent a release procedure about 5 years ago.  She does recall having nerve conduction studies performed bilaterally prior to this while living in [place] .  She has been utilizing a carpal tunnel brace particularly at night.  She is right-hand dominant.   Surgical History:   None  PMH/PSH/Family History/Social History/Meds/Allergies:    Past Medical History:  Diagnosis Date   Anemia 12/09/2018   Chronic low back pain    Chronic neck pain    Diabetes type 2, uncontrolled    diagnosed in 2019   Hyperlipidemia 08/01/2021   Hypertension complicating diabetes (HCC)    Neck pain 07/11/2021   Obesity 12/09/2018   OSA (obstructive sleep apnea)    sleep study done in [place] in 2019 per pt   Rash 01/28/2022   Screening for colon cancer 08/01/2021   Past Surgical History:  Procedure Laterality Date   COLONOSCOPY     COLONOSCOPY N/A 09/03/2021   Procedure: COLONOSCOPY;  Surgeon: Marnee Sink, MD;  Location: [facility] ENDOSCOPY;  Service: Endoscopy;  Laterality: N/A;   SHOULDER ARTHROSCOPY WITH ROTATOR CUFF REPAIR Left 07/07/2023   Procedure: LEFT SHOULDER ARTHROSCOPY / DEBRIDEMENT WITH ROTATOR CUFF REPAIR;  Surgeon: Wilhelmenia Harada, MD;  Location: [facility];  Service: Orthopedics;  Laterality: Left;   TUBAL LIGATION     Social History   Socioeconomic History   Marital status: Legally Separated    Spouse name: Not on file   Number of children: Not on file   Years of education: Not on file   Highest education level: Associate degree: academic program   Occupational History   Not on file  Tobacco Use   Smoking status: Never   Smokeless tobacco: Never  Vaping Use   Vaping status: Never Used  Substance and Sexual Activity   Alcohol use: Never   Drug use: Never   Sexual activity: Not Currently    Partners: Male    Birth control/protection: Surgical, Post-menopausal    Comment: BTL, First IC <16 y/o, >5 Partners, Hx of RPR, GC  Other Topics Concern   Not on file  Social History Narrative   Not on file   Social Drivers of Health   Financial Resource Strain: Low Risk  (02/08/2024)   Overall Financial Resource Strain (CARDIA)    Difficulty of Paying Living Expenses: Not very hard  Food Insecurity: Food Insecurity Present (02/08/2024)   Hunger Vital Sign    Worried About Running Out of Food in the Last Year: Never true    Ran Out of Food in the Last Year: Sometimes true  Transportation Needs: No Transportation Needs (02/08/2024)   PRAPARE - Administrator, Civil Service (Medical): No    Lack of Transportation (Non-Medical): No  Physical Activity: Inactive (02/08/2024)   Exercise Vital Sign    Days of Exercise per Week: 0 days    Minutes of Exercise per  Session: 10 min  Stress: Stress Concern Present (02/08/2024)   [employer] of Occupational Health - Occupational Stress Questionnaire    Feeling of Stress : To some extent  Social Connections: Socially Isolated (02/08/2024)   Social Connection and Isolation Panel [NHANES]    Frequency of Communication with Friends and Family: More than three times a week    Frequency of Social Gatherings with Friends and Family: More than three times a week    Attends Religious Services: Never    Database administrator or Organizations: No    Attends Engineer, structural: Not on file    Marital Status: Separated   Family History  Problem Relation Age of Onset   Varicose Veins Father    Hypertension Sister    Diabetes Sister    Diabetes Sister    Heart murmur Brother     Cancer Maternal Grandmother    Allergies  Allergen Reactions   Nsaids Nausea Only    nausea   Tape Rash    Surgical tape    Current Outpatient Medications  Medication Sig Dispense Refill   meloxicam (MOBIC) 15 MG tablet Take 1 tablet (15 mg total) by mouth daily for 14 days. 14 tablet 0   amLODipine  (NORVASC ) 5 MG tablet Take 1 tablet by mouth once daily 90 tablet 1   atorvastatin  (LIPITOR) 20 MG tablet Take 1 tablet by mouth once daily 90 tablet 1   Blood Glucose Monitoring Suppl (BLOOD GLUCOSE MONITOR SYSTEM) w/Device KIT 1 each by Does not apply route in the morning, at noon, and at bedtime. 1 kit 0   DULoxetine  (CYMBALTA ) 30 MG capsule Take 1 capsule (30 mg total) by mouth daily. 90 capsule 1   glipiZIDE  (GLUCOTROL ) 10 MG tablet Take 1 tablet (10 mg total) by mouth 2 (two) times daily before a meal. 180 tablet 1   Glucose Blood (BLOOD GLUCOSE TEST STRIPS) STRP For monitoring blood sugar levels up to 4 times a day. May substitute to any manufacturer covered by patient's insurance. 200 strip 99   insulin  glargine-yfgn (SEMGLEE , YFGN,) 100 UNIT/ML Pen Inject 30 Units into the skin at bedtime. Inject 10 Units into the skin at bedtime. Take 10 UNITS nightly, may go up by 3 UNITS every 3 days if morning sugars are greater than 130 in increments of (13 UNITS, 16 UNITS, 19 UNITS, 22 UNITS, 25 UNITS every 3 days based on morning sugars) do not go higher than 25 UNITS.     Insulin  Pen Needle (UNIFINE PENTIPS) 31G X 6 MM MISC Use daily with insulin  100 each 1   Lancet Device MISC For monitoring blood sugar up to 4 times a day. May substitute to any manufacturer covered by patient's insurance. 200 each 99   Lancets (FREESTYLE) lancets Test 4x daily 200 each 11   Semaglutide , 1 MG/DOSE, 4 MG/3ML SOPN Inject 1 mg as directed once a week. 6 mL 1   No current facility-administered medications for this visit.   No results found.  Review of Systems:   A ROS was performed including pertinent  positives and negatives as documented in the HPI.  Physical Exam :   Constitutional: NAD and appears stated age Neurological: Alert and oriented Psych: Appropriate affect and cooperative Last menstrual period 03/09/2019.   Comprehensive Musculoskeletal Exam:    Exam of the right wrist and hand demonstrates no obvious deformity.  Positive Tinel's test at the wrist.  Grip strength 4/5 compared to 5/5 on contralateral side.  Active  wrist range of motion to 45 degrees of flexion extension.  Radial pulse 2+.  Imaging:   Xray (right hand 3 views): Negative for acute bony abnormality.  Positive ulnar variance.   I personally reviewed and interpreted the radiographs.   Assessment:   53 y.o. female with 2-week history of persistent numbness and tingling in the right hand which appears very consistent with carpal tunnel syndrome.  She does have a history of this in the contralateral side and has undergone a carpal tunnel release.  Tinel's test performed today reproduces symptoms.  She has been utilizing a carpal tunnel wrist brace.  Injection was considered today, however patient's last A1c 3 weeks ago was 13.2, so we will hold off on utilization of any steroids.  I will plan to start her on a short course of meloxicam and encouraged continued brace usage.  Should she fail to improve with this, may need to consider referral to Dr. Agarwala for further management.  Plan :    - Continue splint usage and start meloxicam 15 mg - Return to clinic as needed and consider referral to Dr. Agarwala if symptoms worsen or persist     I personally saw and evaluated the patient, and participated in the management and treatment plan.  Sharrell Deck, PA-C Orthopedics

## 2024-03-02 ENCOUNTER — Other Ambulatory Visit (HOSPITAL_BASED_OUTPATIENT_CLINIC_OR_DEPARTMENT_OTHER): Payer: Self-pay

## 2024-03-24 ENCOUNTER — Ambulatory Visit (HOSPITAL_BASED_OUTPATIENT_CLINIC_OR_DEPARTMENT_OTHER): Admitting: Student

## 2024-03-24 DIAGNOSIS — G5601 Carpal tunnel syndrome, right upper limb: Secondary | ICD-10-CM

## 2024-03-24 DIAGNOSIS — M542 Cervicalgia: Secondary | ICD-10-CM | POA: Diagnosis not present

## 2024-03-24 NOTE — Progress Notes (Signed)
 Chief Complaint: Right hand numbness     History of Present Illness:   03/24/24: Patient presents today for follow-up of numbness and weakness in her right hand.  Reports no improvement with meloxicam .  Continues to utilize bracing at night.  Symptoms seem to worsen throughout the day with activity.  Does admit to some pain in her neck with mild pain in the upper arm, but only numbness and tingling in the hand.   03/01/24: Cindy Carson is a 53 y.o. female presenting to clinic for evaluation of tingling in the right hand.  She reports that this has been persistent over the past 2 weeks.  Numbness and tingling is most noticeable in the middle finger.  She does have history of carpal tunnel syndrome in the left hand for which she underwent a release procedure about 5 years ago.  She does recall having nerve conduction studies performed bilaterally prior to this while living in [place] .  She has been utilizing a carpal tunnel brace particularly at night.  She is right-hand dominant.   Surgical History:   None  PMH/PSH/Family History/Social History/Meds/Allergies:    Past Medical History:  Diagnosis Date   Anemia 12/09/2018   Chronic low back pain    Chronic neck pain    Diabetes type 2, uncontrolled    diagnosed in 2019   Hyperlipidemia 08/01/2021   Hypertension complicating diabetes (HCC)    Neck pain 07/11/2021   Obesity 12/09/2018   OSA (obstructive sleep apnea)    sleep study done in [place] in 2019 per pt   Rash 01/28/2022   Screening for colon cancer 08/01/2021   Past Surgical History:  Procedure Laterality Date   COLONOSCOPY     COLONOSCOPY N/A 09/03/2021   Procedure: COLONOSCOPY;  Surgeon: Jinny Carmine, MD;  Location[facility] ENDOSCOPY;  Service: Endoscopy;  Laterality: N/A;   SHOULDER ARTHROSCOPY WITH ROTATOR CUFF REPAIR Left 07/07/2023   Procedure: LEFT SHOULDER ARTHROSCOPY / DEBRIDEMENT WITH ROTATOR CUFF REPAIR;  Surgeon: Genelle Standing,  MD;  Location:  SURGERY CENTER;  Service: Orthopedics;  Laterality: Left;   TUBAL LIGATION     Social History   Socioeconomic History   Marital status: Legally Separated    Spouse name: Not on file   Number of children: Not on file   Years of education: Not on file   Highest education level: Associate degree: academic program  Occupational History   Not on file  Tobacco Use   Smoking status: Never   Smokeless tobacco: Never  Vaping Use   Vaping status: Never Used  Substance and Sexual Activity   Alcohol use: Never   Drug use: Never   Sexual activity: Not Currently    Partners: Male    Birth control/protection: Surgical, Post-menopausal    Comment: BTL, First IC <15 y/o, >5 Partners, Hx of RPR, GC  Other Topics Concern   Not on file  Social History Narrative   Not on file   Social Drivers of Health   Financial Resource Strain: Low Risk  (02/08/2024)   Overall Financial Resource Strain (CARDIA)    Difficulty of Paying Living Expenses: Not very hard  Food Insecurity: Food Insecurity Present (02/08/2024)   Hunger Vital Sign    Worried About Running Out of Food in the Last Year: Never true    Ran Out  of Food in the Last Year: Sometimes true  Transportation Needs: No Transportation Needs (02/08/2024)   PRAPARE - Administrator, Civil Service (Medical): No    Lack of Transportation (Non-Medical): No  Physical Activity: Inactive (02/08/2024)   Exercise Vital Sign    Days of Exercise per Week: 0 days    Minutes of Exercise per Session: 10 min  Stress: Stress Concern Present (02/08/2024)   [employer] of Occupational Health - Occupational Stress Questionnaire    Feeling of Stress : To some extent  Social Connections: Socially Isolated (02/08/2024)   Social Connection and Isolation Panel    Frequency of Communication with Friends and Family: More than three times a week    Frequency of Social Gatherings with Friends and Family: More than three times a  week    Attends Religious Services: Never    Database administrator or Organizations: No    Attends Engineer, structural: Not on file    Marital Status: Separated   Family History  Problem Relation Age of Onset   Varicose Veins Father    Hypertension Sister    Diabetes Sister    Diabetes Sister    Heart murmur Brother    Cancer Maternal Grandmother    Allergies  Allergen Reactions   Nsaids Nausea Only    nausea   Tape Rash    Surgical tape    Current Outpatient Medications  Medication Sig Dispense Refill   amLODipine  (NORVASC ) 5 MG tablet Take 1 tablet by mouth once daily 90 tablet 1   atorvastatin  (LIPITOR) 20 MG tablet Take 1 tablet by mouth once daily 90 tablet 1   Blood Glucose Monitoring Suppl (BLOOD GLUCOSE MONITOR SYSTEM) w/Device KIT 1 each by Does not apply route in the morning, at noon, and at bedtime. 1 kit 0   DULoxetine  (CYMBALTA ) 30 MG capsule Take 1 capsule (30 mg total) by mouth daily. 90 capsule 1   glipiZIDE  (GLUCOTROL ) 10 MG tablet Take 1 tablet (10 mg total) by mouth 2 (two) times daily before a meal. 180 tablet 1   Glucose Blood (BLOOD GLUCOSE TEST STRIPS) STRP For monitoring blood sugar levels up to 4 times a day. May substitute to any manufacturer covered by patient's insurance. 200 strip 99   insulin  glargine-yfgn (SEMGLEE , YFGN,) 100 UNIT/ML Pen Inject 30 Units into the skin at bedtime. Inject 10 Units into the skin at bedtime. Take 10 UNITS nightly, may go up by 3 UNITS every 3 days if morning sugars are greater than 130 in increments of (13 UNITS, 16 UNITS, 19 UNITS, 22 UNITS, 25 UNITS every 3 days based on morning sugars) do not go higher than 25 UNITS.     Insulin  Pen Needle (UNIFINE PENTIPS) 31G X 6 MM MISC Use daily with insulin  100 each 1   Lancet Device MISC For monitoring blood sugar up to 4 times a day. May substitute to any manufacturer covered by patient's insurance. 200 each 99   Lancets (FREESTYLE) lancets Test 4x daily 200 each 11    Semaglutide , 1 MG/DOSE, 4 MG/3ML SOPN Inject 1 mg as directed once a week. 6 mL 1   No current facility-administered medications for this visit.   No results found.  Review of Systems:   A ROS was performed including pertinent positives and negatives as documented in the HPI.  Physical Exam :   Constitutional: NAD and appears stated age Neurological: Alert and oriented Psych: Appropriate affect and cooperative Last menstrual  period 03/09/2019.   Comprehensive Musculoskeletal Exam:    Exam of the right wrist and hand demonstrates no obvious deformity.  Positive Tinel's test at the wrist.  Grip strength 4/5 compared to 5/5 on contralateral side.  Active wrist range of motion to 45 degrees of flexion and extension.  Radial pulse 2+.  Some tenderness in the right cervical paraspinal musculature.  Discomfort noted in terminal cervical range of motion in all planes.  Negative Spurling's.  Imaging:     Assessment:   53 y.o. female with complaints of numbness, tingling, and weakness within the right hand.  She does have history of carpal tunnel syndrome on the contralateral side and underwent a release procedure.  She continues to fail to get relief with conservative measures including NSAIDs and bracing.  Last A1c was 13.2, therefore steroids are contraindicated and discussed that surgical intervention would likely not be considered until this comes down significantly.  Patient states understanding and that she feels that her medication regimen is going much better.  She does describe some pain in her neck and some in the upper arm.  I will plan to proceed with an EMG/NCV to further evaluate and determine if there are any other contributing factors such as cubital tunnel or radiculopathy, the latter which I do have suspicion for.  Depending on the study results, may consider getting her into see Dr. Arlinda if this confirms significant carpal tunnel syndrome.  Plan :    - Referral to Dr. Eldonna  for right upper extremity nerve conduction study - Consider referral to Dr. Arlinda based on results     I personally saw and evaluated the patient, and participated in the management and treatment plan.  Leonce Reveal, PA-C Orthopedics

## 2024-04-07 DIAGNOSIS — G4733 Obstructive sleep apnea (adult) (pediatric): Secondary | ICD-10-CM | POA: Diagnosis not present

## 2024-04-22 ENCOUNTER — Ambulatory Visit (INDEPENDENT_AMBULATORY_CARE_PROVIDER_SITE_OTHER): Admitting: Physical Medicine and Rehabilitation

## 2024-04-22 DIAGNOSIS — M79641 Pain in right hand: Secondary | ICD-10-CM

## 2024-04-22 DIAGNOSIS — R29898 Other symptoms and signs involving the musculoskeletal system: Secondary | ICD-10-CM

## 2024-04-22 DIAGNOSIS — M542 Cervicalgia: Secondary | ICD-10-CM | POA: Diagnosis not present

## 2024-04-22 DIAGNOSIS — R202 Paresthesia of skin: Secondary | ICD-10-CM | POA: Diagnosis not present

## 2024-04-22 NOTE — Progress Notes (Signed)
 Pain Scale   Average Pain 6  NCS RUE- she has pain, numbness, tingling and weakness in right hand that travels to shoulder x 1 month. Worse at night.   Right handed

## 2024-04-22 NOTE — Progress Notes (Unsigned)
 Cindy Carson - 53 y.o. female MRN [id number]  Date of birth: 10-17-1970  Office Visit Note: Visit Date: 04/22/2024 PCP: Melvin Pao, NP Referred by: Emiliano Leonce CROME, PA*  Subjective: Chief Complaint  Patient presents with   Right Arm - Pain, Numbness, Weakness, Tingling   HPI: Cindy Carson is a 53 y.o. female who comes in today at the request of Leonce Emiliano, PA-C for evaluation and management of chronic, worsening and severe pain, numbness and tingling in the Right upper extremities.  Patient is Right hand dominant.   persistent over the past 2 weeks.  Numbness and tingling is most noticeable in the middle finger.  She does have history of carpal tunnel syndrome in the left hand for which she underwent a release procedure about 5 years ago.  She does recall having nerve conduction studies performed bilaterally prior to this while living in [place] .  She has been utilizing a carpal tunnel brace particularly at night.  She is right-hand dominant.     Review of Systems  Musculoskeletal:  Positive for joint pain.  Neurological:  Positive for tingling and focal weakness.  All other systems reviewed and are negative.  Otherwise per HPI.  Assessment & Plan: Visit Diagnoses:    ICD-10-CM   1. Paresthesia of skin  R20.2 NCV with EMG (electromyography)       Plan: Impression: The above electrodiagnostic study is ABNORMAL and reveals evidence of a severe right median nerve entrapment at the wrist (carpal tunnel syndrome) affecting sensory and motor components.   There is no significant electrodiagnostic evidence of any other focal nerve entrapment, brachial plexopathy or cervical radiculopathy.   Recommendations: 1.  Follow-up with referring physician. 2.  Continue current management of symptoms. 3.  Suggest surgical evaluation.   Meds & Orders: No orders of the defined types were placed in this encounter.   Orders Placed This Encounter  Procedures   NCV with EMG  (electromyography)    Follow-up: No follow-ups on file.   Procedures: No procedures performed      Clinical History: No specialty comments available.   She reports that she has never smoked. She has never used smokeless tobacco.  Recent Labs    08/12/23 0955 11/16/23 0911 02/09/24 0948  HGBA1C 11.3* 10.3* 13.2*    Objective:  VS:  HT:    WT:   BMI:     BP:   HR: bpm  TEMP: ( )  RESP:  Physical Exam Vitals and nursing note reviewed.  Constitutional:      General: She is not in acute distress.    Appearance: Normal appearance. She is well-developed. She is not ill-appearing.  HENT:     Head: Normocephalic and atraumatic.  Eyes:     Conjunctiva/sclera: Conjunctivae normal.     Pupils: Pupils are equal, round, and reactive to light.  Cardiovascular:     Rate and Rhythm: Normal rate.     Pulses: Normal pulses.  Pulmonary:     Effort: Pulmonary effort is normal.  Musculoskeletal:        General: No swelling, tenderness or deformity.     Right lower leg: No edema.     Left lower leg: No edema.     Comments: Inspection reveals no atrophy of the bilateral APB or FDI or hand intrinsics. There is no swelling, color changes, allodynia or dystrophic changes. There is 5 out of 5 strength in the bilateral wrist extension, finger abduction and long finger flexion. There is intact sensation to  light touch in all dermatomal and peripheral nerve distributions. There is a negative Froment's test bilaterally. There is a negative Tinel's test at the bilateral wrist and elbow. There is a negative Phalen's test bilaterally. There is a negative Hoffmann's test bilaterally.  Skin:    General: Skin is warm and dry.     Findings: No erythema or rash.  Neurological:     General: No focal deficit present.     Mental Status: She is alert and oriented to person, place, and time.     Sensory: No sensory deficit.     Motor: No weakness or abnormal muscle tone.     Coordination: Coordination  normal.     Gait: Gait normal.  Psychiatric:        Mood and Affect: Mood normal.        Behavior: Behavior normal.     Ortho Exam  Imaging: No results found.  Past Medical/Family/Surgical/Social History: Medications & Allergies reviewed per EMR, new medications updated. Patient Active Problem List   Diagnosis Date Noted   Diabetes mellitus treated with insulin  and oral medication (HCC) 11/11/2023   Nontraumatic complete tear of left rotator cuff 07/07/2023   Uncontrolled type 2 diabetes mellitus with hyperglycemia (HCC) 07/25/2022   Depression, recurrent (HCC) 07/25/2022   Encounter for screening for malignant neoplasm of breast 07/11/2021   Hyperlipidemia associated with type 2 diabetes mellitus (HCC) 07/11/2021   Personal history of noncompliance with medical treatment, presenting hazards to health 02/15/2019   History of syphilis 12/09/2018   Bursitis of hip 12/09/2018   Anemia 12/09/2018   Obesity 12/09/2018   OSA (obstructive sleep apnea)    HTN (hypertension)    Hypertension complicating diabetes (HCC)    Chronic low back pain    Chronic neck pain    Past Medical History:  Diagnosis Date   Anemia 12/09/2018   Chronic low back pain    Chronic neck pain    Diabetes type 2, uncontrolled    diagnosed in 2019   Hyperlipidemia 08/01/2021   Hypertension complicating diabetes (HCC)    Neck pain 07/11/2021   Obesity 12/09/2018   OSA (obstructive sleep apnea)    sleep study done in [place] in 2019 per pt   Rash 01/28/2022   Screening for colon cancer 08/01/2021   Family History  Problem Relation Age of Onset   Varicose Veins Father    Hypertension Sister    Diabetes Sister    Diabetes Sister    Heart murmur Brother    Cancer Maternal Grandmother    Past Surgical History:  Procedure Laterality Date   COLONOSCOPY     COLONOSCOPY N/A 09/03/2021   Procedure: COLONOSCOPY;  Surgeon: Jinny Carmine, MD;  Location: [facility] ENDOSCOPY;  Service: Endoscopy;  Laterality:  N/A;   SHOULDER ARTHROSCOPY WITH ROTATOR CUFF REPAIR Left 07/07/2023   Procedure: LEFT SHOULDER ARTHROSCOPY / DEBRIDEMENT WITH ROTATOR CUFF REPAIR;  Surgeon: Genelle Standing, MD;  Location: [facility];  Service: Orthopedics;  Laterality: Left;   TUBAL LIGATION     Social History   Occupational History   Not on file  Tobacco Use   Smoking status: Never   Smokeless tobacco: Never  Vaping Use   Vaping status: Never Used  Substance and Sexual Activity   Alcohol use: Never   Drug use: Never   Sexual activity: Not Currently    Partners: Male    Birth control/protection: Surgical, Post-menopausal    Comment: BTL, First IC <15 y/o, >5 Partners, Hx  of RPR, GC

## 2024-04-25 NOTE — Procedures (Unsigned)
 EMG & NCV Findings: Evaluation of the right median motor nerve showed prolonged distal onset latency (6.3 ms), reduced amplitude (4.5 mV), and decreased conduction velocity (Elbow-Wrist, 49 m/s).  The right median (across palm) sensory nerve showed prolonged distal peak latency (Wrist, 5.8 ms) and prolonged distal peak latency (Palm, 19.8 ms).  All remaining nerves (as indicated in the following tables) were within normal limits.    All examined muscles (as indicated in the following table) showed no evidence of electrical instability.    Impression: The above electrodiagnostic study is ABNORMAL and reveals evidence of a severe right median nerve entrapment at the wrist (carpal tunnel syndrome) affecting sensory and motor components.   There is no significant electrodiagnostic evidence of any other focal nerve entrapment, brachial plexopathy or cervical radiculopathy.   Recommendations: 1.  Follow-up with referring physician. 2.  Continue current management of symptoms. 3.  Suggest surgical evaluation.  ___________________________ Prentice Masters FAAPMR Board Certified, American Board of Physical Medicine and Rehabilitation    Nerve Conduction Studies Anti Sensory Summary Table   Stim Site NR Peak (ms) Norm Peak (ms) P-T Amp (V) Norm P-T Amp Site1 Site2 Delta-P (ms) Dist (cm) Vel (m/s) Norm Vel (m/s)  Right Median Acr Palm Anti Sensory (2nd Digit)  30.1C  Wrist    *5.8 <3.6 22.8 >10 Wrist Palm 14.0 0.0    Palm    *19.8 <2.0 0.7         Right Radial Anti Sensory (Base 1st Digit)  30.2C  Wrist    2.0 <3.1 24.7  Wrist Base 1st Digit 2.0 0.0    Right Ulnar Anti Sensory (5th Digit)  30.7C  Wrist    3.0 <3.7 22.5 >15.0 Wrist 5th Digit 3.0 14.0 47 >38  B Elbow    1.9  29.9  B Elbow Wrist 1.1 0.0  >47   Motor Summary Table   Stim Site NR Onset (ms) Norm Onset (ms) O-P Amp (mV) Norm O-P Amp Site1 Site2 Delta-0 (ms) Dist (cm) Vel (m/s) Norm Vel (m/s)  Right Median Motor (Abd Poll Brev)   30.3C  Wrist    *6.3 <4.2 *4.5 >5 Elbow Wrist 4.0 19.5 *49 >50  Elbow    10.3  3.5         Right Ulnar Motor (Abd Dig Min)  30.2C  Wrist    3.0 <4.2 7.7 >3 B Elbow Wrist 3.1 19.5 63 >53  B Elbow    6.1  8.5  A Elbow B Elbow 1.2 11.0 92 >53  A Elbow    7.3  8.2          EMG   Side Muscle Nerve Root Ins Act Fibs Psw Amp Dur Poly Recrt Int Bruna Comment  Right Abd Poll Brev Median C8-T1 Nml Nml Nml Nml Nml 0 Nml Nml   Right 1stDorInt Ulnar C8-T1 Nml Nml Nml Nml Nml 0 Nml Nml   Right PronatorTeres Median C6-7 Nml Nml Nml Nml Nml 0 Nml Nml   Right Biceps Musculocut C5-6 Nml Nml Nml Nml Nml 0 Nml Nml   Right Deltoid Axillary C5-6 Nml Nml Nml Nml Nml 0 Nml Nml     Nerve Conduction Studies Anti Sensory Left/Right Comparison   Stim Site L Lat (ms) R Lat (ms) L-R Lat (ms) L Amp (V) R Amp (V) L-R Amp (%) Site1 Site2 L Vel (m/s) R Vel (m/s) L-R Vel (m/s)  Median Acr Palm Anti Sensory (2nd Digit)  30.1C  Wrist  *5.8   22.8  Wrist Palm     Palm  *19.8   0.7        Radial Anti Sensory (Base 1st Digit)  30.2C  Wrist  2.0   24.7  Wrist Base 1st Digit     Ulnar Anti Sensory (5th Digit)  30.7C  Wrist  3.0   22.5  Wrist 5th Digit  47   B Elbow  1.9   29.9  B Elbow Wrist      Motor Left/Right Comparison   Stim Site L Lat (ms) R Lat (ms) L-R Lat (ms) L Amp (mV) R Amp (mV) L-R Amp (%) Site1 Site2 L Vel (m/s) R Vel (m/s) L-R Vel (m/s)  Median Motor (Abd Poll Brev)  30.3C  Wrist  *6.3   *4.5  Elbow Wrist  *49   Elbow  10.3   3.5        Ulnar Motor (Abd Dig Min)  30.2C  Wrist  3.0   7.7  B Elbow Wrist  63   B Elbow  6.1   8.5  A Elbow B Elbow  92   A Elbow  7.3   8.2           Waveforms:

## 2024-04-26 ENCOUNTER — Encounter: Payer: Self-pay | Admitting: Physical Medicine and Rehabilitation

## 2024-05-08 DIAGNOSIS — G4733 Obstructive sleep apnea (adult) (pediatric): Secondary | ICD-10-CM | POA: Diagnosis not present

## 2024-05-09 DIAGNOSIS — G4733 Obstructive sleep apnea (adult) (pediatric): Secondary | ICD-10-CM | POA: Diagnosis not present

## 2024-05-11 ENCOUNTER — Encounter: Payer: Self-pay | Admitting: Nurse Practitioner

## 2024-05-11 ENCOUNTER — Ambulatory Visit: Admitting: Nurse Practitioner

## 2024-05-11 ENCOUNTER — Other Ambulatory Visit (HOSPITAL_BASED_OUTPATIENT_CLINIC_OR_DEPARTMENT_OTHER): Payer: Self-pay

## 2024-05-11 ENCOUNTER — Telehealth: Payer: Self-pay | Admitting: Nurse Practitioner

## 2024-05-11 VITALS — BP 128/84 | HR 76 | Temp 98.6°F | Resp 16 | Ht 62.52 in | Wt 191.4 lb

## 2024-05-11 DIAGNOSIS — E1169 Type 2 diabetes mellitus with other specified complication: Secondary | ICD-10-CM

## 2024-05-11 DIAGNOSIS — Z794 Long term (current) use of insulin: Secondary | ICD-10-CM

## 2024-05-11 DIAGNOSIS — F339 Major depressive disorder, recurrent, unspecified: Secondary | ICD-10-CM

## 2024-05-11 DIAGNOSIS — E785 Hyperlipidemia, unspecified: Secondary | ICD-10-CM | POA: Diagnosis not present

## 2024-05-11 DIAGNOSIS — I10 Essential (primary) hypertension: Secondary | ICD-10-CM | POA: Diagnosis not present

## 2024-05-11 DIAGNOSIS — E119 Type 2 diabetes mellitus without complications: Secondary | ICD-10-CM

## 2024-05-11 DIAGNOSIS — Z7984 Long term (current) use of oral hypoglycemic drugs: Secondary | ICD-10-CM

## 2024-05-11 DIAGNOSIS — E1165 Type 2 diabetes mellitus with hyperglycemia: Secondary | ICD-10-CM | POA: Diagnosis not present

## 2024-05-11 MED ORDER — BUPROPION HCL ER (XL) 150 MG PO TB24
150.0000 mg | ORAL_TABLET | Freq: Every day | ORAL | 1 refills | Status: AC
  Filled 2024-05-11: qty 90, 90d supply, fill #0
  Filled 2024-08-24: qty 90, 90d supply, fill #1

## 2024-05-11 MED ORDER — TIRZEPATIDE 2.5 MG/0.5ML ~~LOC~~ SOAJ
2.5000 mg | SUBCUTANEOUS | 0 refills | Status: DC
  Filled 2024-05-11: qty 2, 28d supply, fill #0

## 2024-05-11 MED ORDER — TRIAMCINOLONE ACETONIDE 0.1 % EX CREA
1.0000 | TOPICAL_CREAM | Freq: Two times a day (BID) | CUTANEOUS | 0 refills | Status: AC
  Filled 2024-05-11: qty 30, 15d supply, fill #0

## 2024-05-11 NOTE — Assessment & Plan Note (Signed)
Chronic.  Controlled.  Continue with current medication regimen of Amlodipine 5mg.  Labs ordered today.  Return to clinic in 6 months for reevaluation.  Call sooner if concerns arise.   

## 2024-05-11 NOTE — Telephone Encounter (Signed)
 Called patient and left a message for her to call back to get scheduled for a 1 month follow up.

## 2024-05-11 NOTE — Progress Notes (Signed)
 BP 128/84 (BP Location: Left Arm, Patient Position: Sitting, Cuff Size: Large)   Pulse 76   Temp 98.6 F (37 C) (Oral)   Resp 16   Ht 5' 2.52 (1.588 m)   Wt 191 lb 6.4 oz (86.8 kg)   LMP 03/09/2019   SpO2 96%   BMI 34.43 kg/m    Subjective:    Patient ID: Cindy Carson, female    DOB: 1970-11-15, 53 y.o.   MRN: [id number]  HPI: Cindy Carson is a 53 y.o. female  Chief Complaint  Patient presents with   Follow-up    Diet is doing better but just started watching it. Not eating after 8:30 pm.    Foot exam    Has irritation spot on her left foot.     HYPERTENSION without Chronic Kidney Disease Hypertension status: controlled  Satisfied with current treatment? yes Duration of hypertension: years BP monitoring frequency:  not checking BP range:  BP medication side effects:  no Medication compliance: excellent compliance Previous BP meds:amlodipine  Aspirin : no Recurrent headaches: no Visual changes: no Palpitations: no Dyspnea: no Chest pain: no Lower extremity edema: no Dizzy/lightheaded: no  DIABETES Patient states stopped taking the Ozempic  due to the side effects. She was tolerating the 1mg  okay.  Still using Semglee  30u, Glipizide  10mg  BID.  Hypoglycemic episodes:no Polydipsia/polyuria: no Visual disturbance: no Chest pain: no Paresthesias: no Glucose Monitoring: no  Accucheck frequency: Not Checking  Fasting glucose:  Post prandial:  Evening:  Before meals: Taking Insulin ?: no  Long acting insulin :  Short acting insulin : Blood Pressure Monitoring: not checking Retinal Examination: Not up to Date Foot Exam: up to date Diabetic Education: Not Completed Pneumovax: Up to Date Influenza: Up to Date Aspirin : no  DEPRESSION Patient states she has not been doing okay.  She does not want to increase the duloxetine .  Has terrible headaches if she misses a dose.  Continues with the Duloxetine  30mg .  She did not tolerate increasing the dose of Duloxetine  was too  strong.     Satisfied with current treatment?: no Symptom severity: severe  Duration of current treatment : chronic Side effects: no Medication compliance: good compliance Psychotherapy/counseling: no in the past Previous psychiatric medications: cymbalta  unsure if she has been on other medications in the past  Depressed mood: yes Anxious mood: yes Anhedonia: yes Significant weight loss or gain: no Insomnia: yes hard to stay asleep Fatigue: yes Feelings of worthlessness or guilt: yes Impaired concentration/indecisiveness: yes reports concerns for memory troubles  Suicidal ideations: no Hopelessness: yes  SLEEP APNEA Sleep apnea status: uncontrolled Duration: chronic Satisfied with current treatment?:  no CPAP use:  no Sleep quality with CPAP use: good, average Treament compliance:poor compliance Last sleep study: over a year ago Treatments attempted: CPAP- machine is broke Wakes feeling refreshed:  no Daytime hypersomnolence:  yes Fatigue:  yes Insomnia:  yes Good sleep hygiene:  no Difficulty falling asleep:  no Difficulty staying asleep:  yes Snoring bothers bed partner:  yes Observed apnea by bed partner: no Obesity:  yes Hypertension: yes  Pulmonary hypertension:  no Coronary artery disease:  no   Patient states she is having some irritation between her 4th and 5th toe on the left foot.  Has been going on off and on for about a month or two.  She put cortisone cream but it didn't help.  It does itch.      Relevant past medical, surgical, family and social history reviewed and updated as indicated. Interim medical history  since our last visit reviewed. Allergies and medications reviewed and updated.  Review of Systems  Eyes:  Negative for visual disturbance.  Respiratory:  Positive for apnea. Negative for cough, chest tightness and shortness of breath.        Snoring  Cardiovascular:  Negative for chest pain, palpitations and leg swelling.  Endocrine:  Negative for polydipsia and polyuria.  Skin:        Skin irritation  Neurological:  Positive for numbness. Negative for dizziness and headaches.  Psychiatric/Behavioral:  Positive for dysphoric mood. Negative for suicidal ideas.     Per HPI unless specifically indicated above     Objective:    BP 128/84 (BP Location: Left Arm, Patient Position: Sitting, Cuff Size: Large)   Pulse 76   Temp 98.6 F (37 C) (Oral)   Resp 16   Ht 5' 2.52 (1.588 m)   Wt 191 lb 6.4 oz (86.8 kg)   LMP 03/09/2019   SpO2 96%   BMI 34.43 kg/m   Wt Readings from Last 3 Encounters:  05/11/24 191 lb 6.4 oz (86.8 kg)  02/09/24 192 lb 12.8 oz (87.5 kg)  12/28/23 192 lb (87.1 kg)    Physical Exam Vitals and nursing note reviewed.  Constitutional:      General: She is not in acute distress.    Appearance: Normal appearance. She is normal weight. She is not ill-appearing, toxic-appearing or diaphoretic.  HENT:     Head: Normocephalic.     Right Ear: External ear normal.     Left Ear: External ear normal.     Nose: Nose normal.     Mouth/Throat:     Mouth: Mucous membranes are moist.     Pharynx: Oropharynx is clear.  Eyes:     General:        Right eye: No discharge.        Left eye: No discharge.     Extraocular Movements: Extraocular movements intact.     Conjunctiva/sclera: Conjunctivae normal.     Pupils: Pupils are equal, round, and reactive to light.  Cardiovascular:     Rate and Rhythm: Normal rate and regular rhythm.     Heart sounds: No murmur heard. Pulmonary:     Effort: Pulmonary effort is normal. No respiratory distress.     Breath sounds: Normal breath sounds. No wheezing or rales.  Musculoskeletal:     Cervical back: Normal range of motion and neck supple.  Skin:    General: Skin is warm and dry.     Capillary Refill: Capillary refill takes less than 2 seconds.  Neurological:     General: No focal deficit present.     Mental Status: She is alert and oriented to person, place,  and time. Mental status is at baseline.  Psychiatric:        Mood and Affect: Mood normal.        Behavior: Behavior normal.        Thought Content: Thought content normal.        Judgment: Judgment normal.     Results for orders placed or performed in visit on 02/09/24  Microalbumin, Urine Waived   Collection Time: 02/09/24  9:45 AM  Result Value Ref Range   Microalb, Ur Waived 30 (H) 0 - 19 mg/L   Creatinine, Urine Waived 100 10 - 300 mg/dL   Microalb/Creat Ratio <30 <30 mg/g  Hemoglobin A1c   Collection Time: 02/09/24  9:48 AM  Result Value Ref Range  Hgb A1c MFr Bld 13.2 (H) 4.8 - 5.6 %   Est. average glucose Bld gHb Est-mCnc 332 mg/dL  Comprehensive metabolic panel with GFR   Collection Time: 02/09/24  9:48 AM  Result Value Ref Range   Glucose 344 (H) 70 - 99 mg/dL   BUN 12 6 - 24 mg/dL   Creatinine, Ser 9.17 0.57 - 1.00 mg/dL   eGFR 85 >40 fO/fpw/8.26   BUN/Creatinine Ratio 15 9 - 23   Sodium 138 134 - 144 mmol/L   Potassium 4.6 3.5 - 5.2 mmol/L   Chloride 101 96 - 106 mmol/L   CO2 23 20 - 29 mmol/L   Calcium  9.6 8.7 - 10.2 mg/dL   Total Protein 7.1 6.0 - 8.5 g/dL   Albumin 4.3 3.8 - 4.9 g/dL   Globulin, Total 2.8 1.5 - 4.5 g/dL   Bilirubin Total 0.4 0.0 - 1.2 mg/dL   Alkaline Phosphatase 127 (H) 44 - 121 IU/L   AST 17 0 - 40 IU/L   ALT 26 0 - 32 IU/L  Lipid panel   Collection Time: 02/09/24  9:48 AM  Result Value Ref Range   Cholesterol, Total 155 100 - 199 mg/dL   Triglycerides 795 (H) 0 - 149 mg/dL   HDL 40 >60 mg/dL   VLDL Cholesterol Cal 34 5 - 40 mg/dL   LDL Chol Calc (NIH) 81 0 - 99 mg/dL   Chol/HDL Ratio 3.9 0.0 - 4.4 ratio  Hepatitis C Antibody   Collection Time: 02/09/24  9:48 AM  Result Value Ref Range   Hep C Virus Ab Non Reactive Non Reactive      Assessment & Plan:   Problem List Items Addressed This Visit       Cardiovascular and Mediastinum   HTN (hypertension)   Chronic.  Controlled.  Continue with current medication regimen of  Amlodipine  5mg .  Labs ordered today.  Return to clinic in 6 months for reevaluation.  Call sooner if concerns arise.          Endocrine   Hyperlipidemia associated with type 2 diabetes mellitus (HCC)   Chronic.  Controlled.  Continue with current medication regimen of Atorvastatin  daily.  Refills sent today.  Labs ordered today.  Return to clinic in 6 months for reevaluation.  Call sooner if concerns arise.       Relevant Medications   tirzepatide  (MOUNJARO ) 2.5 MG/0.5ML Pen   Other Relevant Orders   Lipid panel   Uncontrolled type 2 diabetes mellitus with hyperglycemia (HCC) - Primary   Chronic.  Not well controlled.  Last A1c was 13.2%.  Will start Mounjaro  2.5mg  weekly.  Patient had side effects of nausea and constipation with Ozempic .  Continue with Glipizide  and semglee  30u.  Foot exam done.  Labs ordered.  Follow up in 1 month to ensure she is tolerating Mounjaro .       Relevant Medications   tirzepatide  (MOUNJARO ) 2.5 MG/0.5ML Pen   Other Relevant Orders   Comprehensive metabolic panel with GFR   Hemoglobin A1c   Diabetes mellitus treated with insulin  and oral medication (HCC)   Relevant Medications   tirzepatide  (MOUNJARO ) 2.5 MG/0.5ML Pen     Other   Depression, recurrent (HCC)   Chronic. Not well controlled.  Will start Wellbutrin  150mg  daily.  Side effects and benefits discussed. Continue with Duloxetine . Will work to wean down on medication at next visit.  Follow up in 1 month.  Call sooner if concerns arise.  Relevant Medications   buPROPion  (WELLBUTRIN  XL) 150 MG 24 hr tablet     Follow up plan: Return in about 1 month (around 06/11/2024) for Medication Management (virtual).

## 2024-05-11 NOTE — Assessment & Plan Note (Signed)
 Chronic.  Not well controlled.  Last A1c was 13.2%.  Will start Mounjaro  2.5mg  weekly.  Patient had side effects of nausea and constipation with Ozempic .  Continue with Glipizide  and semglee  30u.  Foot exam done.  Labs ordered.  Follow up in 1 month to ensure she is tolerating Mounjaro .

## 2024-05-11 NOTE — Assessment & Plan Note (Signed)
 Chronic. Not well controlled.  Will start Wellbutrin  150mg  daily.  Side effects and benefits discussed. Continue with Duloxetine . Will work to wean down on medication at next visit.  Follow up in 1 month.  Call sooner if concerns arise.

## 2024-05-11 NOTE — Assessment & Plan Note (Signed)
Chronic.  Controlled.  Continue with current medication regimen of Atorvastatin daily.  Refills sent today.  Labs ordered today.  Return to clinic in 6 months for reevaluation.  Call sooner if concerns arise.    

## 2024-05-12 ENCOUNTER — Ambulatory Visit: Payer: Self-pay | Admitting: Nurse Practitioner

## 2024-05-12 LAB — COMPREHENSIVE METABOLIC PANEL WITH GFR
ALT: 25 IU/L (ref 0–32)
AST: 20 IU/L (ref 0–40)
Albumin: 4.1 g/dL (ref 3.8–4.9)
Alkaline Phosphatase: 125 IU/L — ABNORMAL HIGH (ref 44–121)
BUN/Creatinine Ratio: 13 (ref 9–23)
BUN: 11 mg/dL (ref 6–24)
Bilirubin Total: 0.3 mg/dL (ref 0.0–1.2)
CO2: 24 mmol/L (ref 20–29)
Calcium: 9.4 mg/dL (ref 8.7–10.2)
Chloride: 99 mmol/L (ref 96–106)
Creatinine, Ser: 0.84 mg/dL (ref 0.57–1.00)
Globulin, Total: 2.8 g/dL (ref 1.5–4.5)
Glucose: 305 mg/dL — ABNORMAL HIGH (ref 70–99)
Potassium: 4.8 mmol/L (ref 3.5–5.2)
Sodium: 136 mmol/L (ref 134–144)
Total Protein: 6.9 g/dL (ref 6.0–8.5)
eGFR: 83 mL/min/1.73 (ref >59)

## 2024-05-12 LAB — LIPID PANEL
Chol/HDL Ratio: 3.3 ratio (ref 0.0–4.4)
Cholesterol, Total: 139 mg/dL (ref 100–199)
HDL: 42 mg/dL (ref >39)
LDL Chol Calc (NIH): 64 mg/dL (ref 0–99)
Triglycerides: 203 mg/dL — ABNORMAL HIGH (ref 0–149)
VLDL Cholesterol Cal: 33 mg/dL (ref 5–40)

## 2024-05-12 LAB — HEMOGLOBIN A1C
Est. average glucose Bld gHb Est-mCnc: 326 mg/dL
Hgb A1c MFr Bld: 13 % — ABNORMAL HIGH (ref 4.8–5.6)

## 2024-05-31 ENCOUNTER — Ambulatory Visit: Payer: Self-pay

## 2024-05-31 ENCOUNTER — Other Ambulatory Visit: Payer: Self-pay | Admitting: Nurse Practitioner

## 2024-05-31 DIAGNOSIS — F339 Major depressive disorder, recurrent, unspecified: Secondary | ICD-10-CM

## 2024-05-31 DIAGNOSIS — E119 Type 2 diabetes mellitus without complications: Secondary | ICD-10-CM

## 2024-05-31 NOTE — Telephone Encounter (Signed)
 FYI Only or Action Required?: FYI only for provider.  Patient was last seen in primary care on 05/11/2024 by Melvin Pao, NP.  Called Nurse Triage reporting Hyperglycemia.  Symptoms began a few days ago off and on and currently present.  Interventions attempted: Nothing.  Symptoms are: gradually worsening.  Triage Disposition: Go to ED Now (or PCP Triage)  Patient/caregiver understands and will follow disposition?: Yes               Copied from CRM [id number]. Topic: Clinical - Red Word Triage >> May 31, 2024  3:14 PM Tobias L wrote: Red Word that prompted transfer to Nurse Triage: eyes are blurry, patient is diabetic Reason for Disposition  [1] Blurred vision or visual changes AND [2] present now AND [3] sudden onset or new (e.g., minutes, hours, days)  (Exception: Seeing floaters / black specks OR previously diagnosed migraine headaches with same symptoms.)  Answer Assessment - Initial Assessment Questions Patient states that for the past few days her vision has been getting very blurry off and on She states the last time she checked her blood sugar it was in the 100s & states that when her blood sugar is high in the 400s is when she starts to experience symptoms like this and she hasn't checked her blood sugar level due to being at work Patient is advised that the recommendation at this time is to be seen at the Emergency Room Patient is agreeable with this plan and is currently at the Emergency Room at work and agrees to be evaluated      1. DESCRIPTION: How has your vision changed? (e.g., complete vision loss, blurred vision, double vision, floaters, etc.)     Patient states blurry vision 2. LOCATION: One or both eyes? If one, ask: Which eye?     ----- 3. SEVERITY: Can you see anything? If Yes, ask: What can you see? (e.g., fine print)     Patient states very hard to read right now 4. ONSET: When did this begin? Did it start suddenly or has this  been gradual?     Off and on  5. PATTERN: Does this come and go, or has it been constant since it started?     Comes and goes for the past few days 6. PAIN: Is there any pain in your eye(s)?  (Scale 1-10; or mild, moderate, severe)     Just blurry 7. CONTACTS-GLASSES: Do you wear contacts or glasses?     Glasses---but states that she doesn't wear them when her vision is newly blurry like this 8. CAUSE: What do you think is causing this visual problem?     unsure 9. OTHER SYMPTOMS: Do you have any other symptoms? (e.g., confusion, headache, arm or leg weakness, speech problems)     Patient denies  Protocols used: Vision Loss or Change-A-AH

## 2024-06-01 NOTE — Telephone Encounter (Signed)
 Called and LVM asking for patient to please return my call. Do not see any notes in the chart from an ER .

## 2024-06-02 ENCOUNTER — Other Ambulatory Visit (HOSPITAL_BASED_OUTPATIENT_CLINIC_OR_DEPARTMENT_OTHER): Payer: Self-pay

## 2024-06-02 MED ORDER — DULOXETINE HCL 30 MG PO CPEP
30.0000 mg | ORAL_CAPSULE | Freq: Every day | ORAL | 0 refills | Status: DC
  Filled 2024-06-02: qty 90, 90d supply, fill #0

## 2024-06-02 MED ORDER — AMLODIPINE BESYLATE 5 MG PO TABS
5.0000 mg | ORAL_TABLET | Freq: Every day | ORAL | 0 refills | Status: DC
  Filled 2024-06-02: qty 90, 90d supply, fill #0

## 2024-06-02 NOTE — Telephone Encounter (Signed)
 Patient has appointment tomorrow

## 2024-06-02 NOTE — Telephone Encounter (Signed)
 Requested Prescriptions  Pending Prescriptions Disp Refills   DULoxetine  (CYMBALTA ) 30 MG capsule 90 capsule 0    Sig: Take 1 capsule (30 mg total) by mouth daily.     Psychiatry: Antidepressants - SNRI - duloxetine  Passed - 06/02/2024 10:34 AM      Passed - Cr in normal range and within 360 days    Creatinine, Ser  Date Value Ref Range Status  05/11/2024 0.84 0.57 - 1.00 mg/dL Final         Passed - eGFR is 30 or above and within 360 days    GFR calc Af Amer  Date Value Ref Range Status  03/30/2019 92 >59 mL/min/1.73 Final   GFR calc non Af Amer  Date Value Ref Range Status  03/30/2019 80 >59 mL/min/1.73 Final   eGFR  Date Value Ref Range Status  05/11/2024 83 >59 mL/min/1.73 Final         Passed - Completed PHQ-2 or PHQ-9 in the last 360 days      Passed - Last BP in normal range    BP Readings from Last 1 Encounters:  05/11/24 128/84         Passed - Valid encounter within last 6 months    Recent Outpatient Visits           3 weeks ago Uncontrolled type 2 diabetes mellitus with hyperglycemia (HCC)   [place] [facility] Melvin Pao, NP   3 months ago Hypertension complicating diabetes [facility])   [place] [facility] Melvin Pao, NP   6 months ago Diabetes mellitus treated with insulin  and oral medication [facility])   [place] [facility] Melvin Pao, NP               amLODipine  (NORVASC ) 5 MG tablet 90 tablet 0    Sig: Take 1 tablet by mouth once daily     Cardiovascular: Calcium  Channel Blockers 2 Passed - 06/02/2024 10:34 AM      Passed - Last BP in normal range    BP Readings from Last 1 Encounters:  05/11/24 128/84         Passed - Last Heart Rate in normal range    Pulse Readings from Last 1 Encounters:  05/11/24 76         Passed - Valid encounter within last 6 months    Recent Outpatient Visits           3 weeks ago Uncontrolled type 2 diabetes mellitus with hyperglycemia (HCC)    [place] [facility] Melvin Pao, NP   3 months ago Hypertension complicating diabetes [facility])   [place] [facility] Melvin Pao, NP   6 months ago Diabetes mellitus treated with insulin  and oral medication [facility])    [facility] Melvin Pao, NP

## 2024-06-03 ENCOUNTER — Other Ambulatory Visit (HOSPITAL_BASED_OUTPATIENT_CLINIC_OR_DEPARTMENT_OTHER): Payer: Self-pay

## 2024-06-03 ENCOUNTER — Encounter: Payer: Self-pay | Admitting: Nurse Practitioner

## 2024-06-03 ENCOUNTER — Telehealth (INDEPENDENT_AMBULATORY_CARE_PROVIDER_SITE_OTHER): Admitting: Nurse Practitioner

## 2024-06-03 DIAGNOSIS — E1165 Type 2 diabetes mellitus with hyperglycemia: Secondary | ICD-10-CM | POA: Diagnosis not present

## 2024-06-03 MED ORDER — TIRZEPATIDE 5 MG/0.5ML ~~LOC~~ SOAJ
5.0000 mg | SUBCUTANEOUS | 1 refills | Status: AC
  Filled 2024-06-03: qty 2, 28d supply, fill #0
  Filled 2024-07-13: qty 2, 28d supply, fill #1
  Filled 2024-08-24: qty 2, 28d supply, fill #2

## 2024-06-03 NOTE — Progress Notes (Signed)
 LMP 03/09/2019    Subjective:    Patient ID: Cindy Carson, female    DOB: March 21, 1971, 54 y.o.   MRN: [id number]  HPI: Cindy Carson is a 53 y.o. female  Chief Complaint  Patient presents with   Diabetes    Pt is being seen for a DM follow up.   DIABETES Patient was changed from Ozempic  to Zepbound  at last visit.  She feels herself losing weight.  She denies any side effects.   Hypoglycemic episodes:no Polydipsia/polyuria: no Visual disturbance: no Chest pain: no Paresthesias: no Glucose Monitoring: yes  Accucheck frequency: Daily  Fasting glucose: 120  Post prandial:  Evening:  Before meals: Taking Insulin ?: no  Long acting insulin :  Short acting insulin : Blood Pressure Monitoring: not checking   Relevant past medical, surgical, family and social history reviewed and updated as indicated. Interim medical history since our last visit reviewed. Allergies and medications reviewed and updated.  Review of Systems  Eyes:  Positive for visual disturbance.  Cardiovascular:  Negative for chest pain.  Endocrine: Negative for polydipsia and polyuria.  Neurological:  Negative for numbness.    Per HPI unless specifically indicated above     Objective:    LMP 03/09/2019   Wt Readings from Last 3 Encounters:  05/11/24 191 lb 6.4 oz (86.8 kg)  02/09/24 192 lb 12.8 oz (87.5 kg)  12/28/23 192 lb (87.1 kg)    Physical Exam Vitals and nursing note reviewed.  HENT:     Head: Normocephalic.     Right Ear: Hearing normal.     Left Ear: Hearing normal.     Nose: Nose normal.  Eyes:     Pupils: Pupils are equal, round, and reactive to light.  Pulmonary:     Effort: Pulmonary effort is normal. No respiratory distress.  Neurological:     Mental Status: She is alert.  Psychiatric:        Mood and Affect: Mood normal.        Behavior: Behavior normal.        Thought Content: Thought content normal.        Judgment: Judgment normal.     Results for orders placed or performed  in visit on 05/11/24  Comprehensive metabolic panel with GFR   Collection Time: 05/11/24  9:18 AM  Result Value Ref Range   Glucose 305 (H) 70 - 99 mg/dL   BUN 11 6 - 24 mg/dL   Creatinine, Ser 9.15 0.57 - 1.00 mg/dL   eGFR 83 >40 fO/fpw/8.26   BUN/Creatinine Ratio 13 9 - 23   Sodium 136 134 - 144 mmol/L   Potassium 4.8 3.5 - 5.2 mmol/L   Chloride 99 96 - 106 mmol/L   CO2 24 20 - 29 mmol/L   Calcium  9.4 8.7 - 10.2 mg/dL   Total Protein 6.9 6.0 - 8.5 g/dL   Albumin 4.1 3.8 - 4.9 g/dL   Globulin, Total 2.8 1.5 - 4.5 g/dL   Bilirubin Total 0.3 0.0 - 1.2 mg/dL   Alkaline Phosphatase 125 (H) 44 - 121 IU/L   AST 20 0 - 40 IU/L   ALT 25 0 - 32 IU/L  Hemoglobin A1c   Collection Time: 05/11/24  9:18 AM  Result Value Ref Range   Hgb A1c MFr Bld 13.0 (H) 4.8 - 5.6 %   Est. average glucose Bld gHb Est-mCnc 326 mg/dL  Lipid panel   Collection Time: 05/11/24  9:18 AM  Result Value Ref Range   Cholesterol, Total  139 100 - 199 mg/dL   Triglycerides 796 (H) 0 - 149 mg/dL   HDL 42 >60 mg/dL   VLDL Cholesterol Cal 33 5 - 40 mg/dL   LDL Chol Calc (NIH) 64 0 - 99 mg/dL   Chol/HDL Ratio 3.3 0.0 - 4.4 ratio      Assessment & Plan:   Problem List Items Addressed This Visit       Endocrine   Uncontrolled type 2 diabetes mellitus with hyperglycemia (HCC) - Primary   Chronic. Not well controlled.  Last A1c was 13.2%. Will increase Mounjaro  to 5mg .  Sugars are running around 120.  Encouraged patient to make sure she is eating regularly to decrease low sugar symptoms.  Follow up in 2 months.  Will check labs at next visit.       Relevant Medications   tirzepatide  (MOUNJARO ) 5 MG/0.5ML Pen     Follow up plan: Return in about 2 months (around 08/03/2024) for HTN, HLD, DM2 FU.  This visit was completed via MyChart due to the restrictions of the COVID-19 pandemic. All issues as above were discussed and addressed. Physical exam was done as above through visual confirmation on MyChart. If it was  felt that the patient should be evaluated in the office, they were directed there. The patient verbally consented to this visit. Location of the patient: Home Location of the provider: Office Those involved with this call:  Provider: Darice Petty, NP CMA: Vita Cleveland, CMA Front Desk/Registration: Claretta Maiden This encounter was conducted via video.  I spent 30 minutes dedicated to the care of this patient on the date of this encounter to include previsit review of plan of care, follow up, medications, and next visit, face to face time with the patient, and post visit ordering of testing.

## 2024-06-03 NOTE — Progress Notes (Signed)
 Scheduled

## 2024-06-03 NOTE — Assessment & Plan Note (Signed)
 Chronic. Not well controlled.  Last A1c was 13.2%. Will increase Mounjaro  to 5mg .  Sugars are running around 120.  Encouraged patient to make sure she is eating regularly to decrease low sugar symptoms.  Follow up in 2 months.  Will check labs at next visit.

## 2024-06-03 NOTE — Progress Notes (Signed)
 Called patient and left a message for her to call back to get scheduled for.SABRASABRAReturn in about 2 months (around 08/03/2024) for HTN, HLD, DM2 FU.

## 2024-06-08 DIAGNOSIS — G4733 Obstructive sleep apnea (adult) (pediatric): Secondary | ICD-10-CM | POA: Diagnosis not present

## 2024-06-09 ENCOUNTER — Ambulatory Visit (HOSPITAL_BASED_OUTPATIENT_CLINIC_OR_DEPARTMENT_OTHER): Admitting: Student

## 2024-06-09 ENCOUNTER — Ambulatory Visit (HOSPITAL_BASED_OUTPATIENT_CLINIC_OR_DEPARTMENT_OTHER)

## 2024-06-09 DIAGNOSIS — M542 Cervicalgia: Secondary | ICD-10-CM | POA: Diagnosis not present

## 2024-06-09 DIAGNOSIS — G5601 Carpal tunnel syndrome, right upper limb: Secondary | ICD-10-CM

## 2024-06-09 DIAGNOSIS — M4802 Spinal stenosis, cervical region: Secondary | ICD-10-CM | POA: Diagnosis not present

## 2024-06-09 DIAGNOSIS — M47812 Spondylosis without myelopathy or radiculopathy, cervical region: Secondary | ICD-10-CM | POA: Diagnosis not present

## 2024-06-09 DIAGNOSIS — G4733 Obstructive sleep apnea (adult) (pediatric): Secondary | ICD-10-CM | POA: Diagnosis not present

## 2024-06-09 DIAGNOSIS — M5030 Other cervical disc degeneration, unspecified cervical region: Secondary | ICD-10-CM | POA: Diagnosis not present

## 2024-06-09 NOTE — Progress Notes (Signed)
 Chief Complaint: Neck and right shoulder pain    Discussed the use of AI scribe software for clinical note transcription with the patient, who gave verbal consent to proceed.  History of Present Illness Cindy Carson is a 53 year old female with carpal tunnel syndrome who presents with neck pain radiating to the shoulder.  Her symptoms began about 1 week ago without any known cause or injury.  The pain worsens with prolonged sitting and is somewhat relieved by lifting her arm. Neck movement, especially turning to the right and looking up, is painful. Occasionally, pain radiates down her arm. Her fingers remain numb, which she attributes to carpal tunnel syndrome.  A nerve conduction study was performed less than 2 months ago which demonstrated severe right carpal tunnel syndrome without cervical radiculopathy, and she has had a release procedure on her left hand. She uses ibuprofen for pain relief, which helps but causes stomach discomfort and nausea if taken more than once a day. She is on a medication regimen for diabetes, including recent Mounjaro  injections.    Surgical History:   None  PMH/PSH/Family History/Social History/Meds/Allergies:    Past Medical History:  Diagnosis Date   Allergy 07/10/21   NSAIDS   Anemia 12/09/2018   Arthritis 07/10/21   Lower Back   Chronic low back pain    Chronic neck pain    Diabetes type 2, uncontrolled    diagnosed in 2019   Hyperlipidemia 08/01/2021   Hypertension complicating diabetes (HCC)    Neck pain 07/11/2021   Obesity 12/09/2018   OSA (obstructive sleep apnea)    sleep study done in [place] in 2019 per pt   Rash 01/28/2022   Screening for colon cancer 08/01/2021   Sleep apnea 2020   Cpap   Ulcer 2018   Stomache   Past Surgical History:  Procedure Laterality Date   COLONOSCOPY     COLONOSCOPY N/A 09/03/2021   Procedure: COLONOSCOPY;  Surgeon: Jinny Carmine, MD;  Location: [facility] ENDOSCOPY;   Service: Endoscopy;  Laterality: N/A;   SHOULDER ARTHROSCOPY WITH ROTATOR CUFF REPAIR Left 07/07/2023   Procedure: LEFT SHOULDER ARTHROSCOPY / DEBRIDEMENT WITH ROTATOR CUFF REPAIR;  Surgeon: Genelle Standing, MD;  Location:  SURGERY CENTER;  Service: Orthopedics;  Laterality: Left;   TUBAL LIGATION     Social History   Socioeconomic History   Marital status: Legally Separated    Spouse name: Not on file   Number of children: Not on file   Years of education: Not on file   Highest education level: Associate degree: academic program  Occupational History   Not on file  Tobacco Use   Smoking status: Never   Smokeless tobacco: Never  Vaping Use   Vaping status: Never Used  Substance and Sexual Activity   Alcohol use: Never   Drug use: Never   Sexual activity: Not Currently    Partners: Male    Birth control/protection: Surgical, Post-menopausal    Comment: BTL, First IC <16 y/o, >5 Partners, Hx of RPR, GC  Other Topics Concern   Not on file  Social History Narrative   Not on file   Social Drivers of Health   Financial Resource Strain: Low Risk  (05/07/2024)   Overall Financial Resource Strain (CARDIA)    Difficulty of Paying Living Expenses: Not very hard  Food Insecurity: Food Insecurity Present (05/07/2024)   Hunger Vital Sign    Worried About Running Out of Food in the Last Year: Sometimes true    Ran Out of Food in the Last Year: Sometimes true  Transportation Needs: No Transportation Needs (05/07/2024)   PRAPARE - Administrator, Civil Service (Medical): No    Lack of Transportation (Non-Medical): No  Physical Activity: Insufficiently Active (05/07/2024)   Exercise Vital Sign    Days of Exercise per Week: 1 day    Minutes of Exercise per Session: 20 min  Stress: Stress Concern Present (05/07/2024)   [employer] of Occupational Health - Occupational Stress Questionnaire    Feeling of Stress: Very much  Social Connections: Socially Isolated  (05/07/2024)   Social Connection and Isolation Panel    Frequency of Communication with Friends and Family: Three times a week    Frequency of Social Gatherings with Friends and Family: Once a week    Attends Religious Services: Never    Database administrator or Organizations: No    Attends Engineer, structural: Not on file    Marital Status: Separated   Family History  Problem Relation Age of Onset   Alcohol abuse Mother    Varicose Veins Father    Alcohol abuse Father    Hypertension Sister    Diabetes Sister    Diabetes Sister    Heart murmur Brother    Cancer Maternal Grandmother    Asthma Brother    Diabetes Sister    Allergies  Allergen Reactions   Nsaids Nausea Only    nausea   Tape Rash    Surgical tape    Current Outpatient Medications  Medication Sig Dispense Refill   amLODipine  (NORVASC ) 5 MG tablet Take 1 tablet by mouth once daily 90 tablet 0   atorvastatin  (LIPITOR) 20 MG tablet Take 1 tablet by mouth once daily 90 tablet 1   Blood Glucose Monitoring Suppl (BLOOD GLUCOSE MONITOR SYSTEM) w/Device KIT 1 each by Does not apply route in the morning, at noon, and at bedtime. 1 kit 0   buPROPion  (WELLBUTRIN  XL) 150 MG 24 hr tablet Take 1 tablet (150 mg total) by mouth daily. 90 tablet 1   DULoxetine  (CYMBALTA ) 30 MG capsule Take 1 capsule (30 mg total) by mouth daily. 90 capsule 0   glipiZIDE  (GLUCOTROL ) 10 MG tablet Take 1 tablet (10 mg total) by mouth 2 (two) times daily before a meal. 180 tablet 1   Glucose Blood (BLOOD GLUCOSE TEST STRIPS) STRP For monitoring blood sugar levels up to 4 times a day. May substitute to any manufacturer covered by patient's insurance. 200 strip 99   insulin  glargine-yfgn (SEMGLEE , YFGN,) 100 UNIT/ML Pen Inject 30 Units into the skin at bedtime. Inject 10 Units into the skin at bedtime. Take 10 UNITS nightly, may go up by 3 UNITS every 3 days if morning sugars are greater than 130 in increments of (13 UNITS, 16 UNITS, 19 UNITS,  22 UNITS, 25 UNITS every 3 days based on morning sugars) do not go higher than 25 UNITS.     Insulin  Pen Needle (UNIFINE PENTIPS) 31G X 6 MM MISC Use daily with insulin  100 each 1   Lancet Device MISC For monitoring blood sugar up to 4 times a day. May substitute to any manufacturer covered by patient's insurance. 200 each 99   Lancets (FREESTYLE) lancets Test 4x daily 200 each 11   tirzepatide  (MOUNJARO ) 5 MG/0.5ML Pen  Inject 5 mg into the skin once a week. 6 mL 1   triamcinolone  cream (KENALOG ) 0.1 % Apply 1 Application topically 2 (two) times daily. 30 g 0   No current facility-administered medications for this visit.   No results found.  Review of Systems:   A ROS was performed including pertinent positives and negatives as documented in the HPI.  Physical Exam :   Constitutional: NAD and appears stated age Neurological: Alert and oriented Psych: Appropriate affect and cooperative Last menstrual period 03/09/2019.   Comprehensive Musculoskeletal Exam:    Physical exam demonstrates tenderness over the C7 spinous process and lower right paraspinal musculature.  She also demonstrates tenderness throughout the right upper trapezius distribution.  Cervical range of motion is very limited particularly with rotation to the right and extension.  Motor exam to the hand and wrist is intact although there is notable weakness with positive Tinel's.  Imaging:   Xray (cervical spine 4 views): Negative for acute bony abnormality.  Loss of cervical lordosis and multilevel degenerative disc disease with anterior osteophyte formation, which are large at the C5 and C6 levels.   I personally reviewed and interpreted the radiographs.      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 Nerve study on 8/1 shows no cervical radiculopathy but did confirm severe right carpal  tunnel syndrome. Conservative management is preferred over surgery. Refer to physical therapy for neck pain and stiffness. Consider referral to Dr. Georgina if no improvement with physical therapy.  Right carpal tunnel syndrome   Significant right carpal tunnel syndrome is confirmed by recent nerve study. She had a previous left hand release. Current management is limited due to elevated A1c.  Continue to improve glycemic control in order to become a candidate for injections or procedural intervention.  Patient is currently on Mounjaro .       I personally saw and evaluated the patient, and participated in the management and treatment plan.  Leonce Reveal, PA-C Orthopedics

## 2024-06-13 ENCOUNTER — Other Ambulatory Visit (HOSPITAL_BASED_OUTPATIENT_CLINIC_OR_DEPARTMENT_OTHER): Payer: Self-pay

## 2024-06-13 ENCOUNTER — Other Ambulatory Visit: Payer: Self-pay

## 2024-06-13 ENCOUNTER — Emergency Department (HOSPITAL_BASED_OUTPATIENT_CLINIC_OR_DEPARTMENT_OTHER)
Admission: EM | Admit: 2024-06-13 | Discharge: 2024-06-13 | Disposition: A | Discharge status: Home / Self Care | Attending: Emergency Medicine | Admitting: Emergency Medicine

## 2024-06-13 ENCOUNTER — Encounter (HOSPITAL_BASED_OUTPATIENT_CLINIC_OR_DEPARTMENT_OTHER): Payer: Self-pay

## 2024-06-13 DIAGNOSIS — Z794 Long term (current) use of insulin: Secondary | ICD-10-CM | POA: Insufficient documentation

## 2024-06-13 DIAGNOSIS — Z7984 Long term (current) use of oral hypoglycemic drugs: Secondary | ICD-10-CM | POA: Insufficient documentation

## 2024-06-13 DIAGNOSIS — M5412 Radiculopathy, cervical region: Secondary | ICD-10-CM | POA: Diagnosis not present

## 2024-06-13 DIAGNOSIS — E119 Type 2 diabetes mellitus without complications: Secondary | ICD-10-CM | POA: Insufficient documentation

## 2024-06-13 DIAGNOSIS — M542 Cervicalgia: Secondary | ICD-10-CM | POA: Diagnosis present

## 2024-06-13 MED ORDER — LIDOCAINE 5 % EX PTCH
1.0000 | MEDICATED_PATCH | CUTANEOUS | Status: DC
  Administered 2024-06-13: 1 via TRANSDERMAL
  Filled 2024-06-13: qty 1

## 2024-06-13 MED ORDER — KETOROLAC TROMETHAMINE 15 MG/ML IJ SOLN
15.0000 mg | Freq: Once | INTRAMUSCULAR | Status: AC
  Administered 2024-06-13: 15 mg via INTRAMUSCULAR
  Filled 2024-06-13: qty 1

## 2024-06-13 MED ORDER — HYDROCODONE-ACETAMINOPHEN 5-325 MG PO TABS
2.0000 | ORAL_TABLET | ORAL | 0 refills | Status: DC | PRN
  Filled 2024-06-13: qty 15, 2d supply, fill #0

## 2024-06-13 NOTE — Discharge Instructions (Signed)
 You were seen for your neck pain in the emergency department.   At home, please use over-the-counter Tylenol , ibuprofen, and lidocaine  patches.  You may also use the norco we have prescribed you as needed for pain.  Do not take the norco before driving or operating heavy machinery because it can make you drowsy.  Follow-up with your primary doctor in 2-3 days regarding your visit.  Follow-up with the spine clinic as soon as possible regarding your symptoms.  Return immediately to the emergency department if you experience any of the following: Numbness or weakness of your arms or legs, bowel or bladder incontinence, numbness while wiping after pooping or urinating, or any other concerning symptoms.    Thank you for visiting our Emergency Department. It was a pleasure taking care of you today.

## 2024-06-13 NOTE — ED Provider Notes (Signed)
 [facility] EMERGENCY DEPARTMENT AT [facility] Provider Note   CSN: [id number] Arrival date & time: 06/13/24  0825     Patient presents with: Neck Pain   Cindy Carson is a 53 y.o. female.   53 year old female with a history of diabetes, chronic neck pain and back pain who presents emergency department with neck pain.  Patient reports that over the past few weeks she has been having pain from her neck that radiates down her right upper extremity.  Sharp in nature.  Worse with movement.  Also has been having some trapezius pain.  No bowel or bladder incontinence.  No numbness or weakness that is new.  No fevers.  No history of IV drug use, cancer, or blood thinner use.  Has been trying Tylenol , ibuprofen, and Tiger balm without relief.  Has been evaluated for this and has had a cervical spine x-ray on 9/18 that showed cervical lordosis with degenerative disc disease and osteophyte formation at C5 and C6.  Did have a nerve conduction study on 8/1 which shows no cervical radiculopathy but severe carpal tunnel syndrome.  Is following with orthopedics for this.       Prior to Admission medications   Medication Sig Start Date End Date Taking? Authorizing Provider  HYDROcodone -acetaminophen  (NORCO/VICODIN) 5-325 MG tablet Take 2 tablets by mouth every 4 (four) hours as needed. 06/13/24  Yes Yolande Lamar BROCKS, MD  amLODipine  (NORVASC ) 5 MG tablet Take 1 tablet by mouth once daily 06/02/24   Melvin Pao, NP  atorvastatin  (LIPITOR) 20 MG tablet Take 1 tablet by mouth once daily 11/11/23   Melvin Pao, NP  Blood Glucose Monitoring Suppl (BLOOD GLUCOSE MONITOR SYSTEM) w/Device KIT 1 each by Does not apply route in the morning, at noon, and at bedtime. 06/22/23   Pearley, Hyla Givens, NP  buPROPion  (WELLBUTRIN  XL) 150 MG 24 hr tablet Take 1 tablet (150 mg total) by mouth daily. 05/11/24   Melvin Pao, NP  DULoxetine  (CYMBALTA ) 30 MG capsule Take 1 capsule (30 mg total) by mouth  daily. 06/02/24   Melvin Pao, NP  glipiZIDE  (GLUCOTROL ) 10 MG tablet Take 1 tablet (10 mg total) by mouth 2 (two) times daily before a meal. 02/09/24   Melvin Pao, NP  Glucose Blood (BLOOD GLUCOSE TEST STRIPS) STRP For monitoring blood sugar levels up to 4 times a day. May substitute to any manufacturer covered by patient's insurance. 06/22/23   Pearley, Hyla Givens, NP  insulin  glargine-yfgn (SEMGLEE , YFGN,) 100 UNIT/ML Pen Inject 30 Units into the skin at bedtime. Inject 10 Units into the skin at bedtime. Take 10 UNITS nightly, may go up by 3 UNITS every 3 days if morning sugars are greater than 130 in increments of (13 UNITS, 16 UNITS, 19 UNITS, 22 UNITS, 25 UNITS every 3 days based on morning sugars) do not go higher than 25 UNITS. 11/11/23   Melvin Pao, NP  Insulin  Pen Needle (UNIFINE PENTIPS) 31G X 6 MM MISC Use daily with insulin  06/25/23     Lancet Device MISC For monitoring blood sugar up to 4 times a day. May substitute to any manufacturer covered by patient's insurance. 06/22/23   Thaddeus Hyla Givens, NP  Lancets (FREESTYLE) lancets Test 4x daily 06/22/23   Pearley, Hyla Givens, NP  tirzepatide  (MOUNJARO ) 5 MG/0.5ML Pen Inject 5 mg into the skin once a week. 06/03/24   Melvin Pao, NP  triamcinolone  cream (KENALOG ) 0.1 % Apply 1 Application topically 2 (two) times daily. 05/11/24   Melvin Pao,  NP    Allergies: Nsaids and Tape    Review of Systems  Updated Vital Signs BP (!) 164/88   Pulse 89   Temp 97.8 F (36.6 C) (Oral)   Resp 20   Ht 5' 2.5 (1.588 m)   Wt 86.8 kg   LMP 03/09/2019   SpO2 100%   BMI 34.44 kg/m   Physical Exam Musculoskeletal:     Comments: Midline and paraspinal C-spine tenderness to palpation at approximately C6.  Motor: Muscle bulk and tone are normal. Strength is 5/5 in shoulder abduction, elbow flexion and extension, grip strength, hip flexion, knee flexion and extension, ankle dorsiflexion and plantar flexion  bilaterally. Full strength of great toe dorsiflexion bilaterally.  Sensory: Intact sensation to light touch in C5-T1 dermatomes and L2 though S1 dermatomes bilaterally.       (all labs ordered are listed, but only abnormal results are displayed) Labs Reviewed - No data to display  EKG: None  Radiology: No results found.   Procedures   Medications Ordered in the ED  lidocaine  (LIDODERM ) 5 % 1 patch (has no administration in time range)  ketorolac  (TORADOL ) 15 MG/ML injection 15 mg (has no administration in time range)                                    Medical Decision Making Risk Prescription drug management.   Cindy Carson is a 53 y.o. female with comorbidities that complicate the patient evaluation including diabetes, chronic neck pain, and chronic back pain who presents to the emergency department with neck pain  Initial Ddx:  cervical radiculopathy, spinal cord compression, pathologic fracture, spinal epidural abscess, spinal epidural hematoma  MDM:  Feel the patient likely has cervical radiculopathy or a muscle strain of the trapezius given their symptoms.  No signs or symptoms of cord compression such as bowel or bladder incontinence or numbness or weakness that would warrant MRI at this time.  Has already had a cervical spine x-ray that did not show any signs of pathologic fracture or tumor.  Nerve conduction study is not consistent with myelopathy.  Already follows with orthopedics for her carpal tunnel. No risk factors for spinal epidural abscess or spinal epidural hematoma either.  Plan:  Lidocaine  patch Toradol  Spine clinic follow-up Norco prescription  This patient presents to the ED for concern of complaints listed in HPI, this involves an extensive number of treatment options, and is a complaint that carries with it a high risk of complications and morbidity. Disposition including potential need for admission considered.   Dispo: DC Home. Return precautions  discussed including, but not limited to, those listed in the AVS. Allowed pt time to ask questions which were answered fully prior to dc.  Records reviewed Outpatient Clinic Notes I have reviewed the patients home medications and made adjustments as needed   Final diagnoses:  Cervical radiculopathy    ED Discharge Orders          Ordered    HYDROcodone -acetaminophen  (NORCO/VICODIN) 5-325 MG tablet  Every 4 hours PRN        06/13/24 0917               Yolande Lamar BROCKS, MD 06/13/24 [phone]

## 2024-06-13 NOTE — ED Triage Notes (Addendum)
 Arrives ambulatory to the ED with complaints of worsening neck pain and right shoulder pain x2 weeks. Seen by an Ortho doctor and diagnosed with degenerative pain. Scheduled for physical therapy in November.

## 2024-06-18 ENCOUNTER — Other Ambulatory Visit: Payer: Self-pay | Admitting: Nurse Practitioner

## 2024-06-18 DIAGNOSIS — E785 Hyperlipidemia, unspecified: Secondary | ICD-10-CM

## 2024-06-20 ENCOUNTER — Other Ambulatory Visit: Payer: Self-pay

## 2024-06-21 ENCOUNTER — Other Ambulatory Visit (HOSPITAL_BASED_OUTPATIENT_CLINIC_OR_DEPARTMENT_OTHER): Payer: Self-pay

## 2024-06-21 MED ORDER — ATORVASTATIN CALCIUM 20 MG PO TABS
20.0000 mg | ORAL_TABLET | Freq: Every day | ORAL | 2 refills | Status: AC
  Filled 2024-06-21: qty 90, 90d supply, fill #0
  Filled 2024-08-27 – 2024-09-01 (×2): qty 90, 90d supply, fill #1

## 2024-06-21 NOTE — Telephone Encounter (Signed)
 Requested Prescriptions  Pending Prescriptions Disp Refills   atorvastatin  (LIPITOR) 20 MG tablet 90 tablet 2    Sig: Take 1 tablet by mouth once daily     Cardiovascular:  Antilipid - Statins Failed - 06/21/2024 12:06 PM      Failed - Lipid Panel in normal range within the last 12 months    Cholesterol, Total  Date Value Ref Range Status  05/11/2024 139 100 - 199 mg/dL Final   LDL Chol Calc (NIH)  Date Value Ref Range Status  05/11/2024 64 0 - 99 mg/dL Final   HDL  Date Value Ref Range Status  05/11/2024 42 >39 mg/dL Final   Triglycerides  Date Value Ref Range Status  05/11/2024 203 (H) 0 - 149 mg/dL Final         Passed - Patient is not pregnant      Passed - Valid encounter within last 12 months    Recent Outpatient Visits           2 weeks ago Uncontrolled type 2 diabetes mellitus with hyperglycemia (HCC)   [facility] [place], Darice, NP   1 month ago Uncontrolled type 2 diabetes mellitus with hyperglycemia [facility])   [facility] Melvin Darice, NP   4 months ago Hypertension complicating diabetes [facility])   [facility] Melvin Darice, NP   7 months ago Diabetes mellitus treated with insulin  and oral medication [facility])   [facility] Melvin Darice, NP

## 2024-06-23 DIAGNOSIS — M5412 Radiculopathy, cervical region: Secondary | ICD-10-CM | POA: Diagnosis not present

## 2024-06-23 DIAGNOSIS — Z6834 Body mass index (BMI) 34.0-34.9, adult: Secondary | ICD-10-CM | POA: Diagnosis not present

## 2024-06-23 DIAGNOSIS — G5601 Carpal tunnel syndrome, right upper limb: Secondary | ICD-10-CM | POA: Diagnosis not present

## 2024-06-30 DIAGNOSIS — M542 Cervicalgia: Secondary | ICD-10-CM | POA: Diagnosis not present

## 2024-06-30 DIAGNOSIS — M5412 Radiculopathy, cervical region: Secondary | ICD-10-CM | POA: Diagnosis not present

## 2024-06-30 DIAGNOSIS — M4803 Spinal stenosis, cervicothoracic region: Secondary | ICD-10-CM | POA: Diagnosis not present

## 2024-06-30 DIAGNOSIS — M4722 Other spondylosis with radiculopathy, cervical region: Secondary | ICD-10-CM | POA: Diagnosis not present

## 2024-06-30 DIAGNOSIS — M4802 Spinal stenosis, cervical region: Secondary | ICD-10-CM | POA: Diagnosis not present

## 2024-07-01 ENCOUNTER — Other Ambulatory Visit (HOSPITAL_BASED_OUTPATIENT_CLINIC_OR_DEPARTMENT_OTHER): Payer: Self-pay

## 2024-07-01 MED ORDER — FLUZONE 0.5 ML IM SUSY
0.5000 mL | PREFILLED_SYRINGE | Freq: Once | INTRAMUSCULAR | 0 refills | Status: AC
Start: 2024-07-01 — End: 2024-07-02
  Filled 2024-07-01: qty 0.5, 1d supply, fill #0

## 2024-07-07 ENCOUNTER — Encounter (HOSPITAL_BASED_OUTPATIENT_CLINIC_OR_DEPARTMENT_OTHER): Payer: Self-pay | Admitting: Physical Therapy

## 2024-07-15 ENCOUNTER — Other Ambulatory Visit (HOSPITAL_BASED_OUTPATIENT_CLINIC_OR_DEPARTMENT_OTHER): Payer: Self-pay

## 2024-07-19 DIAGNOSIS — Z6834 Body mass index (BMI) 34.0-34.9, adult: Secondary | ICD-10-CM | POA: Diagnosis not present

## 2024-07-19 DIAGNOSIS — M5412 Radiculopathy, cervical region: Secondary | ICD-10-CM | POA: Diagnosis not present

## 2024-07-25 ENCOUNTER — Ambulatory Visit (HOSPITAL_BASED_OUTPATIENT_CLINIC_OR_DEPARTMENT_OTHER): Attending: Student | Admitting: Physical Therapy

## 2024-07-25 ENCOUNTER — Encounter: Payer: Self-pay | Admitting: Radiology

## 2024-07-25 ENCOUNTER — Encounter (HOSPITAL_BASED_OUTPATIENT_CLINIC_OR_DEPARTMENT_OTHER): Payer: Self-pay

## 2024-07-25 NOTE — Therapy (Incomplete)
 OUTPATIENT PHYSICAL THERAPY CERVICAL EVALUATION   Patient Name: Cindy Carson MRN: [id number] DOB:May 03, 1971, 53 y.o., female Today's Date: 07/25/2024  END OF SESSION:   Past Medical History:  Diagnosis Date   Allergy 07/10/21   NSAIDS   Anemia 12/09/2018   Arthritis 07/10/21   Lower Back   Chronic low back pain    Chronic neck pain    Diabetes type 2, uncontrolled    diagnosed in 2019   Hyperlipidemia 08/01/2021   Hypertension complicating diabetes (HCC)    Neck pain 07/11/2021   Obesity 12/09/2018   OSA (obstructive sleep apnea)    sleep study done in [place] in 2019 per pt   Rash 01/28/2022   Screening for colon cancer 08/01/2021   Sleep apnea 2020   Cpap   Ulcer 2018   Stomache   Past Surgical History:  Procedure Laterality Date   COLONOSCOPY     COLONOSCOPY N/A 09/03/2021   Procedure: COLONOSCOPY;  Surgeon: Jinny Carmine, MD;  Location: [facility] ENDOSCOPY;  Service: Endoscopy;  Laterality: N/A;   SHOULDER ARTHROSCOPY WITH ROTATOR CUFF REPAIR Left 07/07/2023   Procedure: LEFT SHOULDER ARTHROSCOPY / DEBRIDEMENT WITH ROTATOR CUFF REPAIR;  Surgeon: Genelle Standing, MD;  Location: [facility];  Service: Orthopedics;  Laterality: Left;   TUBAL LIGATION     Patient Active Problem List   Diagnosis Date Noted   Diabetes mellitus treated with insulin  and oral medication (HCC) 11/11/2023   Nontraumatic complete tear of left rotator cuff 07/07/2023   Uncontrolled type 2 diabetes mellitus with hyperglycemia (HCC) 07/25/2022   Depression, recurrent 07/25/2022   Encounter for screening for malignant neoplasm of breast 07/11/2021   Hyperlipidemia associated with type 2 diabetes mellitus (HCC) 07/11/2021   Personal history of noncompliance with medical treatment, presenting hazards to health 02/15/2019   History of syphilis 12/09/2018   Bursitis of hip 12/09/2018   Anemia 12/09/2018   Obesity 12/09/2018   OSA (obstructive sleep apnea)    HTN (hypertension)     Hypertension complicating diabetes (HCC)    Chronic low back pain    Chronic neck pain     PCP: Melvin Pao, NP   REFERRING PROVIDER:  Emiliano Leonce CROME, PA-C      REFERRING DIAG:  Diagnosis  M54.2 (ICD-10-CM) - Neck pain  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  Rationale for Evaluation and Treatment: Rehabilitation  ONSET DATE: 07/07/23     SUBJECTIVE:  SUBJECTIVE STATEMENT: *** Hand dominance: {MISC; OT HAND DOMINANCE:[phone]}  PERTINENT HISTORY:  L RCR, SAD, debridement  PAIN:  Are you having pain? Yes: NPRS scale: *** Pain location: *** Pain description: *** Aggravating factors: *** Relieving factors: ***  PRECAUTIONS: {Therapy precautions:24002}  RED FLAGS: {PT Red Flags:29287}     WEIGHT BEARING RESTRICTIONS: No  FALLS:  Has patient fallen in last 6 months? {fallsyesno:27318}  LIVING ENVIRONMENT: Lives with: son & his family   OCCUPATION:  [employer]   PLOF:  Independent  PATIENT GOALS: ***  NEXT MD VISIT: ***  OBJECTIVE:  Note: Objective measures were completed at Evaluation unless otherwise noted.  DIAGNOSTIC FINDINGS:  ***  PATIENT SURVEYS:  NDI:  NECK DISABILITY INDEX  Date: 07/25/24 Score  Pain intensity {NDI-1:32931}  2. Personal care (washing, dressing, etc.) {NDI-2:32932}  3. Lifting {NDI-3:32933}  4. Reading {NDI-4:32934}  5. Headaches {NDI-5:32935}  6. Concentration {NDI-6:32936}  7. Work {NDI-7:32937}  8. Driving {WIP-1:67061}  9. Sleeping {NDI-9:32939}  10. Recreation {NDI-10:32940}  Total ***/50   Minimum Detectable Change (90% confidence): 5 points or 10% points  COGNITION: Overall cognitive status: Within functional limits for tasks  assessed  SENSATION: {sensation:27233}  POSTURE: {posture:25561}  PALPATION: ***   CERVICAL ROM:   Active ROM A/PROM (deg) eval  Flexion   Extension   Right lateral flexion   Left lateral flexion   Right rotation   Left rotation    (Blank rows = not tested)  UPPER EXTREMITY ROM:  Active ROM Right eval Left eval  Shoulder flexion    Shoulder extension    Shoulder abduction    Shoulder adduction    Shoulder extension    Shoulder internal rotation    Shoulder external rotation    Elbow flexion    Elbow extension    Wrist flexion    Wrist extension    Wrist ulnar deviation    Wrist radial deviation    Wrist pronation    Wrist supination     (Blank rows = not tested)  UPPER EXTREMITY MMT:  MMT Right eval Left eval  Shoulder flexion    Shoulder extension    Shoulder abduction    Shoulder adduction    Shoulder extension    Shoulder internal rotation    Shoulder external rotation    Middle trapezius    Lower trapezius    Elbow flexion    Elbow extension    Wrist flexion    Wrist extension    Wrist ulnar deviation    Wrist radial deviation    Wrist pronation    Wrist supination    Grip strength     (Blank rows = not tested)  CERVICAL SPECIAL TESTS:  Upper limb tension test (ULTT): {pos/neg:25243}, Spurling's test: {pos/neg:25243}, and Distraction test: {pos/neg:25243}  FUNCTIONAL TESTS:  {Functional tests:24029}  TREATMENT DATE: 07/25/24  PATIENT EDUCATION: Education details: MOI, diagnosis, prognosis, anatomy, exercise progression, DOMS expectations, acceptable levels of pain, thermotherapy, massage therapy, HEP, POC  Person educated: Patient Education method: Explanation, Demonstration, Tactile cues, Verbal cues, and Handouts Education comprehension: verbalized understanding, returned demonstration, verbal cues  required, tactile cues required, and needs further education   HOME EXERCISE PROGRAM:       ASSESSMENT:   CLINICAL IMPRESSION:  Pt would benefit from continued skilled therapy in order to reach goals and maximize functional cervical strength and ROM for return to PLOF, caregiver duties, and ADL     OBJECTIVE IMPAIRMENTS decreased strength, impaired UE functional use, improper body mechanics, postural dysfunction, pain, and hypomobility .    ACTIVITY LIMITATIONS community activity, occupation, self-care, and exercise/recreation .    PERSONAL FACTORS: 1-2comorbidities, and Time since onset of injury/illness/exacerbation are also affecting patient's functional outcome.     REHAB POTENTIAL: Good   CLINICAL DECISION MAKING: stable/uncomplicated   EVALUATION COMPLEXITY: Low   GOALS: SHORT TERM GOALS: Target Date 09/05/2024        Pt  will become independent with final HEP in order to demonstrate synthesis of PT education on pain management and progression of upper quarter function.      Goal status: INITIAL   2.  Pt will be able to demonstrate ability to perform AROM C/S without pain or stiffness in order to demonstrate functional improvement in upper quarter for self-care and house hold duties.       Goal status: INITIAL       LONG TERM GOALS: Target Date 10/23/2024       1. Pt  will become independent with final HEP in order to demonstrate synthesis of PT education on pain management and progression of upper quarter function.      Goal status: INITIAL   2.  Pt will be able to demonstrate/report ability to sit/drive/sleep/work for extended periods of time without pain in order to demonstrate functional improvement and tolerance to static positioning.     Goal status: INITIAL   3.  Pt will demonstrate at least a 7.5 improvement in Neck Disability Index in order to demonstrate a clinically significant change in neck pain and function.                 Goal status:  INITIAL              4. Pt will be able to carry/hold >15 lbs in order to demonstrate functional improvement in R upper quarter strength for return to normalized ADL and exercise.                   Goal status: INITIAL       PLAN: PT FREQUENCY: 1-2x/weekly   PT DURATION: 12weeks    PLANNED INTERVENTIONS: Therapeutic exercises, Therapeutic activity, Neuromuscular re-education, Balance training, Gait training, Patient/Family education, Self Care, Joint mobilization, Joint manipulation, DME instructions, Aquatic Therapy, Dry Needling, Electrical stimulation, Spinal manipulation, Spinal mobilization, Cryotherapy, Moist heat, scar mobilization, Splintting, Taping, Vasopneumatic device, Traction, Ultrasound, Ionotophoresis 4mg /ml Dexamethasone, Manual therapy, and Re-evaluation   PLAN FOR NEXT SESSION: cervical mobs, TPDN PRN,  , anti-gravity cervical strength, scapular strengthening, postural exercise    Dale Call, PT 07/25/2024, 9:22 AM

## 2024-08-01 ENCOUNTER — Ambulatory Visit (HOSPITAL_BASED_OUTPATIENT_CLINIC_OR_DEPARTMENT_OTHER): Admitting: Physical Therapy

## 2024-08-03 ENCOUNTER — Ambulatory Visit: Admitting: Nurse Practitioner

## 2024-08-08 ENCOUNTER — Encounter (HOSPITAL_BASED_OUTPATIENT_CLINIC_OR_DEPARTMENT_OTHER): Admitting: Physical Therapy

## 2024-08-11 ENCOUNTER — Encounter: Payer: Self-pay | Admitting: Nurse Practitioner

## 2024-08-11 ENCOUNTER — Ambulatory Visit: Admitting: Nurse Practitioner

## 2024-08-11 VITALS — BP 125/84 | HR 88 | Temp 98.9°F | Ht 62.52 in | Wt 192.4 lb

## 2024-08-11 DIAGNOSIS — Z794 Long term (current) use of insulin: Secondary | ICD-10-CM

## 2024-08-11 DIAGNOSIS — Z1231 Encounter for screening mammogram for malignant neoplasm of breast: Secondary | ICD-10-CM

## 2024-08-11 DIAGNOSIS — E1165 Type 2 diabetes mellitus with hyperglycemia: Secondary | ICD-10-CM

## 2024-08-11 DIAGNOSIS — E119 Type 2 diabetes mellitus without complications: Secondary | ICD-10-CM | POA: Diagnosis not present

## 2024-08-11 DIAGNOSIS — Z7984 Long term (current) use of oral hypoglycemic drugs: Secondary | ICD-10-CM

## 2024-08-11 DIAGNOSIS — F339 Major depressive disorder, recurrent, unspecified: Secondary | ICD-10-CM

## 2024-08-11 NOTE — Assessment & Plan Note (Signed)
 Chronic. Not well controlled. A1c checked at visit today.  Patient declined increasing Mounjaro  to 7.5mg  weekly.  Continue with Semglee  30u and Glipizide  10mg  BID.  Follow up in 3 months.  Call sooner if concerns arise.

## 2024-08-11 NOTE — Progress Notes (Signed)
 BP 125/84 (BP Location: Right Arm, Patient Position: Sitting, Cuff Size: Normal)   Pulse 88   Temp 98.9 F (37.2 C) (Oral)   Ht 5' 2.52 (1.588 m)   Wt 192 lb 6.4 oz (87.3 kg)   LMP 03/09/2019   SpO2 95%   BMI 34.61 kg/m    Subjective:    Patient ID: Cindy Carson, female    DOB: 16-Jan-1971, 53 y.o.   MRN: [id number]  HPI: Cindy Carson is a 53 y.o. female  Chief Complaint  Patient presents with   Hyperlipidemia    Patient is doing a 12-month follow up    HYPERTENSION without Chronic Kidney Disease Hypertension status: controlled  Satisfied with current treatment? yes Duration of hypertension: years BP monitoring frequency:  not checking BP range:  BP medication side effects:  no Medication compliance: excellent compliance Previous BP meds:amlodipine  Aspirin : no Recurrent headaches: no Visual changes: no Palpitations: no Dyspnea: no Chest pain: no Lower extremity edema: no Dizzy/lightheaded: no  DIABETES She is tolerating the Mounjaro  5mg  well. Still using Semglee  30u, Glipizide  10mg  BID.  Hypoglycemic episodes:no Polydipsia/polyuria: no Visual disturbance: no Chest pain: no Paresthesias: no Glucose Monitoring: no  Accucheck frequency: Not Checking  Fasting glucose:  Post prandial:  Evening:  Before meals: Taking Insulin ?: no  Long acting insulin : Semglee  30u  Short acting insulin : Blood Pressure Monitoring: not checking Retinal Examination: Not up to Date Foot Exam: up to date Diabetic Education: Not Completed Pneumovax: Up to Date Influenza: Up to Date Aspirin : no  DEPRESSION Patient states she has not been doing okay.  She does not want to increase the duloxetine .  Has terrible headaches if she misses a dose.  Continues with the Duloxetine  30mg .  She did not tolerate increasing the dose of Duloxetine  was too strong.  She feels like her mood has been better with the addition of the Wellbutrin .  Feels like this is a good dose for her.    Satisfied with  current treatment?: no Symptom severity: severe  Duration of current treatment : chronic Side effects: no Medication compliance: good compliance Psychotherapy/counseling: no in the past Previous psychiatric medications: cymbalta  unsure if she has been on other medications in the past  Depressed mood: yes Anxious mood: yes Anhedonia: yes Significant weight loss or gain: no Insomnia: yes hard to stay asleep Fatigue: yes Feelings of worthlessness or guilt: yes Impaired concentration/indecisiveness: yes reports concerns for memory troubles  Suicidal ideations: no Hopelessness: yes  SLEEP APNEA Sleep apnea status: uncontrolled Duration: chronic Satisfied with current treatment?:  no CPAP use:  no Sleep quality with CPAP use: good, average Treament compliance:poor compliance Last sleep study: over a year ago Treatments attempted: CPAP- machine is broke Wakes feeling refreshed:  no Daytime hypersomnolence:  yes Fatigue:  yes Insomnia:  yes Good sleep hygiene:  no Difficulty falling asleep:  no Difficulty staying asleep:  yes Snoring bothers bed partner:  yes Observed apnea by bed partner: no Obesity:  yes Hypertension: yes  Pulmonary hypertension:  no Coronary artery disease:  no   Patient states she is having some irritation between her 4th and 5th toe on the left foot.  Has been going on off and on for about a month or two.  She put cortisone cream but it didn't help.  It does itch.      Relevant past medical, surgical, family and social history reviewed and updated as indicated. Interim medical history since our last visit reviewed. Allergies and medications reviewed and updated.  Review of Systems  Eyes:  Negative for visual disturbance.  Respiratory:  Positive for apnea. Negative for cough, chest tightness and shortness of breath.        Snoring  Cardiovascular:  Negative for chest pain, palpitations and leg swelling.  Endocrine: Negative for polydipsia and  polyuria.  Skin:        Skin irritation  Neurological:  Positive for numbness. Negative for dizziness and headaches.  Psychiatric/Behavioral:  Positive for dysphoric mood. Negative for suicidal ideas.     Per HPI unless specifically indicated above     Objective:    BP 125/84 (BP Location: Right Arm, Patient Position: Sitting, Cuff Size: Normal)   Pulse 88   Temp 98.9 F (37.2 C) (Oral)   Ht 5' 2.52 (1.588 m)   Wt 192 lb 6.4 oz (87.3 kg)   LMP 03/09/2019   SpO2 95%   BMI 34.61 kg/m   Wt Readings from Last 3 Encounters:  08/11/24 192 lb 6.4 oz (87.3 kg)  06/13/24 191 lb 5.8 oz (86.8 kg)  05/11/24 191 lb 6.4 oz (86.8 kg)    Physical Exam Vitals and nursing note reviewed.  Constitutional:      General: She is not in acute distress.    Appearance: Normal appearance. She is normal weight. She is not ill-appearing, toxic-appearing or diaphoretic.  HENT:     Head: Normocephalic.     Right Ear: External ear normal.     Left Ear: External ear normal.     Nose: Nose normal.     Mouth/Throat:     Mouth: Mucous membranes are moist.     Pharynx: Oropharynx is clear.  Eyes:     General:        Right eye: No discharge.        Left eye: No discharge.     Extraocular Movements: Extraocular movements intact.     Conjunctiva/sclera: Conjunctivae normal.     Pupils: Pupils are equal, round, and reactive to light.  Cardiovascular:     Rate and Rhythm: Normal rate and regular rhythm.     Heart sounds: No murmur heard. Pulmonary:     Effort: Pulmonary effort is normal. No respiratory distress.     Breath sounds: Normal breath sounds. No wheezing or rales.  Musculoskeletal:     Cervical back: Normal range of motion and neck supple.  Skin:    General: Skin is warm and dry.     Capillary Refill: Capillary refill takes less than 2 seconds.  Neurological:     General: No focal deficit present.     Mental Status: She is alert and oriented to person, place, and time. Mental status is  at baseline.  Psychiatric:        Mood and Affect: Mood normal.        Behavior: Behavior normal.        Thought Content: Thought content normal.        Judgment: Judgment normal.     Results for orders placed or performed in visit on 05/11/24  Comprehensive metabolic panel with GFR   Collection Time: 05/11/24  9:18 AM  Result Value Ref Range   Glucose 305 (H) 70 - 99 mg/dL   BUN 11 6 - 24 mg/dL   Creatinine, Ser 9.15 0.57 - 1.00 mg/dL   eGFR 83 >40 fO/fpw/8.26   BUN/Creatinine Ratio 13 9 - 23   Sodium 136 134 - 144 mmol/L   Potassium 4.8 3.5 - 5.2 mmol/L  Chloride 99 96 - 106 mmol/L   CO2 24 20 - 29 mmol/L   Calcium  9.4 8.7 - 10.2 mg/dL   Total Protein 6.9 6.0 - 8.5 g/dL   Albumin 4.1 3.8 - 4.9 g/dL   Globulin, Total 2.8 1.5 - 4.5 g/dL   Bilirubin Total 0.3 0.0 - 1.2 mg/dL   Alkaline Phosphatase 125 (H) 44 - 121 IU/L   AST 20 0 - 40 IU/L   ALT 25 0 - 32 IU/L  Hemoglobin A1c   Collection Time: 05/11/24  9:18 AM  Result Value Ref Range   Hgb A1c MFr Bld 13.0 (H) 4.8 - 5.6 %   Est. average glucose Bld gHb Est-mCnc 326 mg/dL  Lipid panel   Collection Time: 05/11/24  9:18 AM  Result Value Ref Range   Cholesterol, Total 139 100 - 199 mg/dL   Triglycerides 796 (H) 0 - 149 mg/dL   HDL 42 >60 mg/dL   VLDL Cholesterol Cal 33 5 - 40 mg/dL   LDL Chol Calc (NIH) 64 0 - 99 mg/dL   Chol/HDL Ratio 3.3 0.0 - 4.4 ratio      Assessment & Plan:   Problem List Items Addressed This Visit       Endocrine   Uncontrolled type 2 diabetes mellitus with hyperglycemia (HCC) - Primary   Chronic. Not well controlled. A1c checked at visit today.  Patient declined increasing Mounjaro  to 7.5mg  weekly.  Continue with Semglee  30u and Glipizide  10mg  BID.  Follow up in 3 months.  Call sooner if concerns arise.       Relevant Orders   Comprehensive metabolic panel with GFR   Hemoglobin A1c   Diabetes mellitus treated with insulin  and oral medication (HCC)     Other   Encounter for screening  for malignant neoplasm of breast   Relevant Orders   MM 3D SCREENING MAMMOGRAM BILATERAL BREAST   Depression, recurrent   Chronic.  Controlled.  Continue with current medication regimen of Duloxetine  and Wellbutrin .  Labs ordered today.  Return to clinic in 6 months for reevaluation.  Call sooner if concerns arise.           Follow up plan: Return in about 3 months (around 11/11/2024) for HTN, HLD, DM2 FU.

## 2024-08-11 NOTE — Assessment & Plan Note (Signed)
 Chronic.  Controlled.  Continue with current medication regimen of Duloxetine  and Wellbutrin .  Labs ordered today.  Return to clinic in 6 months for reevaluation.  Call sooner if concerns arise.

## 2024-08-11 NOTE — Patient Instructions (Signed)
 Please call to schedule your mammogram and/or bone density: [facility] at [facility]  Address: [address], [place], [place] [address] Phone: [phone]  [facility] at [facility] [address] [place],  [place]  [address] Phone: [phone]

## 2024-08-12 ENCOUNTER — Ambulatory Visit: Payer: Self-pay | Admitting: Nurse Practitioner

## 2024-08-12 LAB — HEMOGLOBIN A1C
Est. average glucose Bld gHb Est-mCnc: 163 mg/dL
Hgb A1c MFr Bld: 7.3 % — ABNORMAL HIGH (ref 4.8–5.6)

## 2024-08-12 LAB — COMPREHENSIVE METABOLIC PANEL WITH GFR
ALT: 13 IU/L (ref 0–32)
AST: 15 IU/L (ref 0–40)
Albumin: 4.1 g/dL (ref 3.8–4.9)
Alkaline Phosphatase: 96 IU/L (ref 49–135)
BUN/Creatinine Ratio: 13 (ref 9–23)
BUN: 13 mg/dL (ref 6–24)
Bilirubin Total: 0.3 mg/dL (ref 0.0–1.2)
CO2: 22 mmol/L (ref 20–29)
Calcium: 9.4 mg/dL (ref 8.7–10.2)
Chloride: 106 mmol/L (ref 96–106)
Creatinine, Ser: 0.99 mg/dL (ref 0.57–1.00)
Globulin, Total: 2.9 g/dL (ref 1.5–4.5)
Glucose: 147 mg/dL — ABNORMAL HIGH (ref 70–99)
Potassium: 4.4 mmol/L (ref 3.5–5.2)
Sodium: 142 mmol/L (ref 134–144)
Total Protein: 7 g/dL (ref 6.0–8.5)
eGFR: 68 mL/min/1.73 (ref >59)

## 2024-08-15 ENCOUNTER — Encounter (HOSPITAL_BASED_OUTPATIENT_CLINIC_OR_DEPARTMENT_OTHER): Admitting: Physical Therapy

## 2024-08-16 ENCOUNTER — Other Ambulatory Visit (HOSPITAL_BASED_OUTPATIENT_CLINIC_OR_DEPARTMENT_OTHER): Payer: Self-pay

## 2024-08-24 ENCOUNTER — Other Ambulatory Visit: Payer: Self-pay | Admitting: Nurse Practitioner

## 2024-08-24 ENCOUNTER — Other Ambulatory Visit: Payer: Self-pay

## 2024-08-24 DIAGNOSIS — F339 Major depressive disorder, recurrent, unspecified: Secondary | ICD-10-CM

## 2024-08-24 DIAGNOSIS — E119 Type 2 diabetes mellitus without complications: Secondary | ICD-10-CM

## 2024-08-27 ENCOUNTER — Other Ambulatory Visit (HOSPITAL_BASED_OUTPATIENT_CLINIC_OR_DEPARTMENT_OTHER): Payer: Self-pay

## 2024-08-27 MED ORDER — AMLODIPINE BESYLATE 5 MG PO TABS
5.0000 mg | ORAL_TABLET | Freq: Every day | ORAL | 0 refills | Status: AC
  Filled 2024-08-27: qty 90, 90d supply, fill #0

## 2024-08-27 MED ORDER — DULOXETINE HCL 30 MG PO CPEP
30.0000 mg | ORAL_CAPSULE | Freq: Every day | ORAL | 0 refills | Status: AC
  Filled 2024-08-27 (×2): qty 90, 90d supply, fill #0

## 2024-08-28 ENCOUNTER — Other Ambulatory Visit: Payer: Self-pay

## 2024-09-03 ENCOUNTER — Other Ambulatory Visit (HOSPITAL_BASED_OUTPATIENT_CLINIC_OR_DEPARTMENT_OTHER): Payer: Self-pay

## 2024-10-26 ENCOUNTER — Other Ambulatory Visit (HOSPITAL_COMMUNITY): Payer: Self-pay

## 2024-11-14 ENCOUNTER — Ambulatory Visit: Admitting: Nurse Practitioner
